# Patient Record
Sex: Female | Born: 1940 | Race: White | Hispanic: No | Smoking: Former smoker
Health system: Southern US, Community
[De-identification: ages and names within clinical notes are randomized; demographics above are authoritative.]

## PROBLEM LIST (undated history)

## (undated) DIAGNOSIS — M5136 Other intervertebral disc degeneration, lumbar region: Secondary | ICD-10-CM

## (undated) DIAGNOSIS — K219 Gastro-esophageal reflux disease without esophagitis: Secondary | ICD-10-CM

## (undated) DIAGNOSIS — N189 Chronic kidney disease, unspecified: Secondary | ICD-10-CM

## (undated) DIAGNOSIS — R7303 Prediabetes: Secondary | ICD-10-CM

## (undated) DIAGNOSIS — K589 Irritable bowel syndrome without diarrhea: Secondary | ICD-10-CM

## (undated) DIAGNOSIS — I10 Essential (primary) hypertension: Secondary | ICD-10-CM

## (undated) DIAGNOSIS — M51369 Other intervertebral disc degeneration, lumbar region without mention of lumbar back pain or lower extremity pain: Secondary | ICD-10-CM

## (undated) DIAGNOSIS — M1990 Unspecified osteoarthritis, unspecified site: Secondary | ICD-10-CM

## (undated) DIAGNOSIS — E785 Hyperlipidemia, unspecified: Secondary | ICD-10-CM

## (undated) DIAGNOSIS — H269 Unspecified cataract: Secondary | ICD-10-CM

## (undated) HISTORY — PX: COLONOSCOPY: SHX174

## (undated) HISTORY — DX: Irritable bowel syndrome, unspecified: K58.9

## (undated) HISTORY — DX: Essential (primary) hypertension: I10

## (undated) HISTORY — DX: Prediabetes: R73.03

## (undated) HISTORY — PX: ABDOMINAL HYSTERECTOMY: SHX81

## (undated) HISTORY — DX: Unspecified cataract: H26.9

## (undated) HISTORY — DX: Gastro-esophageal reflux disease without esophagitis: K21.9

## (undated) HISTORY — DX: Other intervertebral disc degeneration, lumbar region: M51.36

## (undated) HISTORY — DX: Other intervertebral disc degeneration, lumbar region without mention of lumbar back pain or lower extremity pain: M51.369

## (undated) HISTORY — DX: Unspecified osteoarthritis, unspecified site: M19.90

## (undated) HISTORY — DX: Hyperlipidemia, unspecified: E78.5

## (undated) HISTORY — DX: Chronic kidney disease, unspecified: N18.9

## (undated) HISTORY — PX: CATARACT EXTRACTION: SUR2

---

## 1998-10-13 ENCOUNTER — Encounter: Payer: Self-pay | Admitting: Internal Medicine

## 1998-10-13 ENCOUNTER — Ambulatory Visit (HOSPITAL_COMMUNITY): Admission: RE | Admit: 1998-10-13 | Discharge: 1998-10-13 | Payer: Self-pay | Admitting: Internal Medicine

## 1999-02-11 ENCOUNTER — Emergency Department (HOSPITAL_COMMUNITY): Admission: EM | Admit: 1999-02-11 | Discharge: 1999-02-11 | Payer: Self-pay | Admitting: Emergency Medicine

## 1999-10-16 ENCOUNTER — Encounter: Payer: Self-pay | Admitting: Internal Medicine

## 1999-10-16 ENCOUNTER — Ambulatory Visit (HOSPITAL_COMMUNITY): Admission: RE | Admit: 1999-10-16 | Discharge: 1999-10-16 | Payer: Self-pay | Admitting: Internal Medicine

## 2000-10-25 ENCOUNTER — Encounter: Payer: Self-pay | Admitting: Internal Medicine

## 2000-10-25 ENCOUNTER — Ambulatory Visit (HOSPITAL_COMMUNITY): Admission: RE | Admit: 2000-10-25 | Discharge: 2000-10-25 | Payer: Self-pay | Admitting: Internal Medicine

## 2001-10-27 ENCOUNTER — Ambulatory Visit (HOSPITAL_COMMUNITY): Admission: RE | Admit: 2001-10-27 | Discharge: 2001-10-27 | Payer: Self-pay | Admitting: Internal Medicine

## 2001-10-27 ENCOUNTER — Encounter: Payer: Self-pay | Admitting: Internal Medicine

## 2002-11-10 ENCOUNTER — Encounter: Payer: Self-pay | Admitting: *Deleted

## 2002-11-10 ENCOUNTER — Emergency Department (HOSPITAL_COMMUNITY): Admission: AC | Admit: 2002-11-10 | Discharge: 2002-11-10 | Payer: Self-pay

## 2002-11-19 ENCOUNTER — Encounter: Payer: Self-pay | Admitting: Internal Medicine

## 2002-11-19 ENCOUNTER — Ambulatory Visit (HOSPITAL_COMMUNITY): Admission: RE | Admit: 2002-11-19 | Discharge: 2002-11-19 | Payer: Self-pay | Admitting: Internal Medicine

## 2003-12-10 ENCOUNTER — Ambulatory Visit (HOSPITAL_COMMUNITY): Admission: RE | Admit: 2003-12-10 | Discharge: 2003-12-10 | Payer: Self-pay | Admitting: Internal Medicine

## 2004-01-05 ENCOUNTER — Ambulatory Visit (HOSPITAL_COMMUNITY): Admission: RE | Admit: 2004-01-05 | Discharge: 2004-01-05 | Payer: Self-pay | Admitting: Internal Medicine

## 2004-12-14 ENCOUNTER — Ambulatory Visit (HOSPITAL_COMMUNITY): Admission: RE | Admit: 2004-12-14 | Discharge: 2004-12-14 | Payer: Self-pay | Admitting: Internal Medicine

## 2005-03-12 ENCOUNTER — Ambulatory Visit: Payer: Self-pay

## 2005-12-25 ENCOUNTER — Ambulatory Visit (HOSPITAL_COMMUNITY): Admission: RE | Admit: 2005-12-25 | Discharge: 2005-12-25 | Payer: Self-pay | Admitting: Internal Medicine

## 2006-12-30 ENCOUNTER — Ambulatory Visit (HOSPITAL_COMMUNITY): Admission: RE | Admit: 2006-12-30 | Discharge: 2006-12-30 | Payer: Self-pay | Admitting: Internal Medicine

## 2008-01-27 ENCOUNTER — Ambulatory Visit (HOSPITAL_COMMUNITY): Admission: RE | Admit: 2008-01-27 | Discharge: 2008-01-27 | Payer: Self-pay | Admitting: Internal Medicine

## 2008-12-04 ENCOUNTER — Emergency Department (HOSPITAL_COMMUNITY): Admission: EM | Admit: 2008-12-04 | Discharge: 2008-12-04 | Payer: Self-pay | Admitting: Emergency Medicine

## 2009-05-06 ENCOUNTER — Ambulatory Visit (HOSPITAL_COMMUNITY): Admission: RE | Admit: 2009-05-06 | Discharge: 2009-05-06 | Payer: Self-pay | Admitting: Internal Medicine

## 2009-11-09 ENCOUNTER — Ambulatory Visit: Payer: Self-pay | Admitting: Oncology

## 2009-11-16 LAB — CBC & DIFF AND RETIC
BASO%: 0.3 % (ref 0.0–2.0)
LYMPH%: 33.3 % (ref 14.0–49.7)
MCHC: 32.8 g/dL (ref 31.5–36.0)
MONO#: 0.9 10*3/uL (ref 0.1–0.9)
MONO%: 7.3 % (ref 0.0–14.0)
Platelets: 286 10*3/uL (ref 145–400)
RBC: 3.88 10*6/uL (ref 3.70–5.45)
Retic %: 1.35 % (ref 0.50–1.50)
WBC: 11.8 10*3/uL — ABNORMAL HIGH (ref 3.9–10.3)

## 2009-11-16 LAB — CHCC SMEAR

## 2009-11-18 LAB — SPEP & IFE WITH QIG
Alpha-2-Globulin: 13.5 % — ABNORMAL HIGH (ref 7.1–11.8)
Gamma Globulin: 12.4 % (ref 11.1–18.8)
IgA: 306 mg/dL (ref 68–378)
IgG (Immunoglobin G), Serum: 818 mg/dL (ref 694–1618)
IgM, Serum: 158 mg/dL (ref 60–263)

## 2009-11-18 LAB — COMPREHENSIVE METABOLIC PANEL
ALT: 9 U/L (ref 0–35)
AST: 11 U/L (ref 0–37)
Albumin: 3.6 g/dL (ref 3.5–5.2)
Alkaline Phosphatase: 104 U/L (ref 39–117)
BUN: 26 mg/dL — ABNORMAL HIGH (ref 6–23)
CO2: 24 mEq/L (ref 19–32)
Calcium: 9.3 mg/dL (ref 8.4–10.5)
Chloride: 104 mEq/L (ref 96–112)
Creatinine, Ser: 1.74 mg/dL — ABNORMAL HIGH (ref 0.40–1.20)
Glucose, Bld: 73 mg/dL (ref 70–99)
Potassium: 4.8 mEq/L (ref 3.5–5.3)
Sodium: 136 mEq/L (ref 135–145)
Total Bilirubin: 0.4 mg/dL (ref 0.3–1.2)
Total Protein: 6.3 g/dL (ref 6.0–8.3)

## 2009-11-18 LAB — IRON AND TIBC
%SAT: 15 % — ABNORMAL LOW (ref 20–55)
Iron: 52 ug/dL (ref 42–145)
TIBC: 356 ug/dL (ref 250–470)
UIBC: 304 ug/dL

## 2009-11-18 LAB — ERYTHROPOIETIN: Erythropoietin: 11.5 m[IU]/mL (ref 2.6–34.0)

## 2009-11-18 LAB — FERRITIN: Ferritin: 17 ng/mL (ref 10–291)

## 2009-11-18 LAB — FOLATE: Folate: 10.5 ng/mL

## 2010-01-06 ENCOUNTER — Ambulatory Visit: Payer: Self-pay | Admitting: Oncology

## 2010-01-10 LAB — CBC WITH DIFFERENTIAL/PLATELET
BASO%: 0.4 % (ref 0.0–2.0)
Basophils Absolute: 0 10*3/uL (ref 0.0–0.1)
EOS%: 1.5 % (ref 0.0–7.0)
Eosinophils Absolute: 0.2 10*3/uL (ref 0.0–0.5)
HCT: 33.2 % — ABNORMAL LOW (ref 34.8–46.6)
HGB: 11.2 g/dL — ABNORMAL LOW (ref 11.6–15.9)
LYMPH%: 31 % (ref 14.0–49.7)
MCH: 28.9 pg (ref 25.1–34.0)
MCHC: 33.7 g/dL (ref 31.5–36.0)
MCV: 85.7 fL (ref 79.5–101.0)
MONO#: 0.7 10*3/uL (ref 0.1–0.9)
MONO%: 6.8 % (ref 0.0–14.0)
NEUT#: 6.3 10*3/uL (ref 1.5–6.5)
NEUT%: 60.3 % (ref 38.4–76.8)
Platelets: 259 10*3/uL (ref 145–400)
RBC: 3.87 10*6/uL (ref 3.70–5.45)
RDW: 13.4 % (ref 11.2–14.5)
WBC: 10.5 10*3/uL — ABNORMAL HIGH (ref 3.9–10.3)
lymph#: 3.2 10*3/uL (ref 0.9–3.3)

## 2010-01-10 LAB — CHCC SMEAR

## 2010-05-15 ENCOUNTER — Ambulatory Visit: Payer: Self-pay | Admitting: Oncology

## 2010-05-17 LAB — CBC WITH DIFFERENTIAL/PLATELET
BASO%: 0.3 % (ref 0.0–2.0)
Basophils Absolute: 0 10*3/uL (ref 0.0–0.1)
EOS%: 2.1 % (ref 0.0–7.0)
Eosinophils Absolute: 0.2 10*3/uL (ref 0.0–0.5)
HCT: 32.5 % — ABNORMAL LOW (ref 34.8–46.6)
HGB: 11.2 g/dL — ABNORMAL LOW (ref 11.6–15.9)
LYMPH%: 21.1 % (ref 14.0–49.7)
MCH: 29.2 pg (ref 25.1–34.0)
MCHC: 34.4 g/dL (ref 31.5–36.0)
MCV: 84.7 fL (ref 79.5–101.0)
MONO#: 0.8 10*3/uL (ref 0.1–0.9)
MONO%: 8.6 % (ref 0.0–14.0)
NEUT#: 6 10*3/uL (ref 1.5–6.5)
NEUT%: 67.9 % (ref 38.4–76.8)
Platelets: 249 10*3/uL (ref 145–400)
RBC: 3.84 10*6/uL (ref 3.70–5.45)
RDW: 13.6 % (ref 11.2–14.5)
WBC: 8.8 10*3/uL (ref 3.9–10.3)
lymph#: 1.8 10*3/uL (ref 0.9–3.3)

## 2010-05-17 LAB — COMPREHENSIVE METABOLIC PANEL
ALT: 12 U/L (ref 0–35)
AST: 15 U/L (ref 0–37)
Albumin: 3.6 g/dL (ref 3.5–5.2)
Alkaline Phosphatase: 106 U/L (ref 39–117)
BUN: 27 mg/dL — ABNORMAL HIGH (ref 6–23)
CO2: 22 mEq/L (ref 19–32)
Calcium: 8.7 mg/dL (ref 8.4–10.5)
Chloride: 105 mEq/L (ref 96–112)
Creatinine, Ser: 1.82 mg/dL — ABNORMAL HIGH (ref 0.40–1.20)
Glucose, Bld: 101 mg/dL — ABNORMAL HIGH (ref 70–99)
Potassium: 5.3 mEq/L (ref 3.5–5.3)
Sodium: 137 mEq/L (ref 135–145)
Total Bilirubin: 0.4 mg/dL (ref 0.3–1.2)
Total Protein: 6.1 g/dL (ref 6.0–8.3)

## 2010-05-17 LAB — IRON AND TIBC
%SAT: 15 % — ABNORMAL LOW (ref 20–55)
Iron: 54 ug/dL (ref 42–145)
TIBC: 366 ug/dL (ref 250–470)
UIBC: 312 ug/dL

## 2010-05-17 LAB — FERRITIN: Ferritin: 11 ng/mL (ref 10–291)

## 2010-06-21 ENCOUNTER — Ambulatory Visit (HOSPITAL_COMMUNITY): Admission: RE | Admit: 2010-06-21 | Discharge: 2010-06-21 | Payer: Self-pay | Admitting: Internal Medicine

## 2010-12-20 ENCOUNTER — Ambulatory Visit: Payer: Self-pay | Admitting: Oncology

## 2010-12-23 ENCOUNTER — Encounter: Payer: Self-pay | Admitting: Internal Medicine

## 2011-01-23 ENCOUNTER — Other Ambulatory Visit: Payer: Self-pay | Admitting: Oncology

## 2011-01-23 ENCOUNTER — Encounter (HOSPITAL_BASED_OUTPATIENT_CLINIC_OR_DEPARTMENT_OTHER): Payer: Medicare Other | Admitting: Oncology

## 2011-01-23 DIAGNOSIS — N189 Chronic kidney disease, unspecified: Secondary | ICD-10-CM

## 2011-01-23 DIAGNOSIS — D638 Anemia in other chronic diseases classified elsewhere: Secondary | ICD-10-CM

## 2011-01-23 DIAGNOSIS — D649 Anemia, unspecified: Secondary | ICD-10-CM

## 2011-01-23 LAB — CBC WITH DIFFERENTIAL/PLATELET
BASO%: 0.4 % (ref 0.0–2.0)
Basophils Absolute: 0 10*3/uL (ref 0.0–0.1)
EOS%: 1.1 % (ref 0.0–7.0)
Eosinophils Absolute: 0.1 10*3/uL (ref 0.0–0.5)
HCT: 33.2 % — ABNORMAL LOW (ref 34.8–46.6)
HGB: 11.3 g/dL — ABNORMAL LOW (ref 11.6–15.9)
LYMPH%: 20 % (ref 14.0–49.7)
MCH: 28.3 pg (ref 25.1–34.0)
MCHC: 34.1 g/dL (ref 31.5–36.0)
MCV: 82.9 fL (ref 79.5–101.0)
MONO#: 0.5 10*3/uL (ref 0.1–0.9)
MONO%: 6.4 % (ref 0.0–14.0)
NEUT#: 6.2 10*3/uL (ref 1.5–6.5)
NEUT%: 72.1 % (ref 38.4–76.8)
Platelets: 221 10*3/uL (ref 145–400)
RBC: 4 10*6/uL (ref 3.70–5.45)
RDW: 13.7 % (ref 11.2–14.5)
WBC: 8.6 10*3/uL (ref 3.9–10.3)
lymph#: 1.7 10*3/uL (ref 0.9–3.3)

## 2011-01-23 LAB — COMPREHENSIVE METABOLIC PANEL
ALT: 13 U/L (ref 0–35)
AST: 19 U/L (ref 0–37)
Albumin: 3.2 g/dL — ABNORMAL LOW (ref 3.5–5.2)
Alkaline Phosphatase: 95 U/L (ref 39–117)
BUN: 28 mg/dL — ABNORMAL HIGH (ref 6–23)
CO2: 26 mEq/L (ref 19–32)
Calcium: 9 mg/dL (ref 8.4–10.5)
Chloride: 103 mEq/L (ref 96–112)
Creatinine, Ser: 1.77 mg/dL — ABNORMAL HIGH (ref 0.40–1.20)
Glucose, Bld: 86 mg/dL (ref 70–99)
Potassium: 4.7 mEq/L (ref 3.5–5.3)
Sodium: 137 mEq/L (ref 135–145)
Total Bilirubin: 0.6 mg/dL (ref 0.3–1.2)
Total Protein: 6.2 g/dL (ref 6.0–8.3)

## 2011-01-23 LAB — FERRITIN: Ferritin: 11 ng/mL (ref 10–291)

## 2011-01-23 LAB — IRON AND TIBC
%SAT: 18 % — ABNORMAL LOW (ref 20–55)
TIBC: 403 ug/dL (ref 250–470)

## 2011-08-06 ENCOUNTER — Emergency Department (HOSPITAL_COMMUNITY)
Admission: EM | Admit: 2011-08-06 | Discharge: 2011-08-06 | Disposition: A | Discharge status: Home / Self Care | Payer: Medicare Other | Attending: Emergency Medicine | Admitting: Emergency Medicine

## 2011-08-06 DIAGNOSIS — K219 Gastro-esophageal reflux disease without esophagitis: Secondary | ICD-10-CM | POA: Insufficient documentation

## 2011-08-06 DIAGNOSIS — E860 Dehydration: Secondary | ICD-10-CM | POA: Insufficient documentation

## 2011-08-06 DIAGNOSIS — R197 Diarrhea, unspecified: Secondary | ICD-10-CM | POA: Insufficient documentation

## 2011-08-06 DIAGNOSIS — R112 Nausea with vomiting, unspecified: Secondary | ICD-10-CM | POA: Insufficient documentation

## 2011-08-06 DIAGNOSIS — Z79899 Other long term (current) drug therapy: Secondary | ICD-10-CM | POA: Insufficient documentation

## 2011-08-06 DIAGNOSIS — Z7982 Long term (current) use of aspirin: Secondary | ICD-10-CM | POA: Insufficient documentation

## 2011-08-06 DIAGNOSIS — I10 Essential (primary) hypertension: Secondary | ICD-10-CM | POA: Insufficient documentation

## 2011-08-06 LAB — URINALYSIS, ROUTINE W REFLEX MICROSCOPIC
Bilirubin Urine: NEGATIVE
Hgb urine dipstick: NEGATIVE
Ketones, ur: NEGATIVE mg/dL
Nitrite: NEGATIVE
Protein, ur: 100 mg/dL — AB
Specific Gravity, Urine: 1.02 (ref 1.005–1.030)
Urobilinogen, UA: 0.2 mg/dL (ref 0.0–1.0)

## 2011-08-06 LAB — URINE MICROSCOPIC-ADD ON

## 2011-08-06 LAB — POCT I-STAT TROPONIN I: Troponin i, poc: 0.01 ng/mL (ref 0.00–0.08)

## 2011-08-09 ENCOUNTER — Other Ambulatory Visit: Payer: Self-pay | Admitting: Internal Medicine

## 2011-08-09 ENCOUNTER — Inpatient Hospital Stay (HOSPITAL_COMMUNITY): Payer: Medicare Other

## 2011-08-09 ENCOUNTER — Inpatient Hospital Stay (HOSPITAL_COMMUNITY)
Admission: EM | Admit: 2011-08-09 | Discharge: 2011-08-17 | DRG: 682 | Disposition: A | Discharge status: Home Health | Payer: Medicare Other | Attending: Internal Medicine | Admitting: Internal Medicine

## 2011-08-09 ENCOUNTER — Emergency Department (HOSPITAL_COMMUNITY): Payer: Medicare Other

## 2011-08-09 DIAGNOSIS — Z7982 Long term (current) use of aspirin: Secondary | ICD-10-CM

## 2011-08-09 DIAGNOSIS — D649 Anemia, unspecified: Secondary | ICD-10-CM | POA: Diagnosis present

## 2011-08-09 DIAGNOSIS — E785 Hyperlipidemia, unspecified: Secondary | ICD-10-CM | POA: Diagnosis present

## 2011-08-09 DIAGNOSIS — T465X5A Adverse effect of other antihypertensive drugs, initial encounter: Secondary | ICD-10-CM | POA: Diagnosis present

## 2011-08-09 DIAGNOSIS — J810 Acute pulmonary edema: Secondary | ICD-10-CM | POA: Diagnosis not present

## 2011-08-09 DIAGNOSIS — Z79899 Other long term (current) drug therapy: Secondary | ICD-10-CM

## 2011-08-09 DIAGNOSIS — N183 Chronic kidney disease, stage 3 unspecified: Secondary | ICD-10-CM | POA: Diagnosis present

## 2011-08-09 DIAGNOSIS — J9600 Acute respiratory failure, unspecified whether with hypoxia or hypercapnia: Secondary | ICD-10-CM | POA: Diagnosis not present

## 2011-08-09 DIAGNOSIS — E875 Hyperkalemia: Secondary | ICD-10-CM | POA: Diagnosis present

## 2011-08-09 DIAGNOSIS — E871 Hypo-osmolality and hyponatremia: Secondary | ICD-10-CM | POA: Diagnosis present

## 2011-08-09 DIAGNOSIS — E872 Acidosis, unspecified: Secondary | ICD-10-CM | POA: Diagnosis present

## 2011-08-09 DIAGNOSIS — N170 Acute kidney failure with tubular necrosis: Principal | ICD-10-CM | POA: Diagnosis present

## 2011-08-09 DIAGNOSIS — I509 Heart failure, unspecified: Secondary | ICD-10-CM | POA: Diagnosis not present

## 2011-08-09 DIAGNOSIS — D72829 Elevated white blood cell count, unspecified: Secondary | ICD-10-CM | POA: Diagnosis present

## 2011-08-09 DIAGNOSIS — N390 Urinary tract infection, site not specified: Secondary | ICD-10-CM | POA: Diagnosis present

## 2011-08-09 DIAGNOSIS — K449 Diaphragmatic hernia without obstruction or gangrene: Secondary | ICD-10-CM | POA: Diagnosis present

## 2011-08-09 DIAGNOSIS — K219 Gastro-esophageal reflux disease without esophagitis: Secondary | ICD-10-CM | POA: Diagnosis present

## 2011-08-09 DIAGNOSIS — K5289 Other specified noninfective gastroenteritis and colitis: Secondary | ICD-10-CM | POA: Diagnosis present

## 2011-08-09 DIAGNOSIS — R197 Diarrhea, unspecified: Secondary | ICD-10-CM | POA: Diagnosis present

## 2011-08-09 DIAGNOSIS — E8779 Other fluid overload: Secondary | ICD-10-CM | POA: Diagnosis not present

## 2011-08-09 DIAGNOSIS — I129 Hypertensive chronic kidney disease with stage 1 through stage 4 chronic kidney disease, or unspecified chronic kidney disease: Secondary | ICD-10-CM | POA: Diagnosis present

## 2011-08-09 DIAGNOSIS — B957 Other staphylococcus as the cause of diseases classified elsewhere: Secondary | ICD-10-CM | POA: Diagnosis present

## 2011-08-09 LAB — URINALYSIS, ROUTINE W REFLEX MICROSCOPIC
Glucose, UA: NEGATIVE mg/dL
Hgb urine dipstick: NEGATIVE
Ketones, ur: NEGATIVE mg/dL
pH: 5 (ref 5.0–8.0)

## 2011-08-09 LAB — DIFFERENTIAL
Eosinophils Absolute: 0 10*3/uL (ref 0.0–0.7)
Lymphocytes Relative: 6 % — ABNORMAL LOW (ref 12–46)
Lymphs Abs: 1.2 10*3/uL (ref 0.7–4.0)
Monocytes Relative: 3 % (ref 3–12)
Neutro Abs: 17.1 10*3/uL — ABNORMAL HIGH (ref 1.7–7.7)
Neutrophils Relative %: 91 % — ABNORMAL HIGH (ref 43–77)

## 2011-08-09 LAB — URINE MICROSCOPIC-ADD ON

## 2011-08-09 LAB — BASIC METABOLIC PANEL
CO2: 12 mEq/L — ABNORMAL LOW (ref 19–32)
Chloride: 94 mEq/L — ABNORMAL LOW (ref 96–112)
Creatinine, Ser: 15.11 mg/dL — ABNORMAL HIGH (ref 0.50–1.10)
Glucose, Bld: 165 mg/dL — ABNORMAL HIGH (ref 70–99)

## 2011-08-09 LAB — CBC
HCT: 27 % — ABNORMAL LOW (ref 36.0–46.0)
Hemoglobin: 9.9 g/dL — ABNORMAL LOW (ref 12.0–15.0)
MCH: 28.4 pg (ref 26.0–34.0)
MCV: 77.6 fL — ABNORMAL LOW (ref 78.0–100.0)
Platelets: 310 10*3/uL (ref 150–400)
RBC: 3.48 MIL/uL — ABNORMAL LOW (ref 3.87–5.11)
WBC: 18.8 10*3/uL — ABNORMAL HIGH (ref 4.0–10.5)

## 2011-08-09 LAB — POCT I-STAT TROPONIN I: Troponin i, poc: 0 ng/mL (ref 0.00–0.08)

## 2011-08-10 ENCOUNTER — Inpatient Hospital Stay (HOSPITAL_COMMUNITY): Payer: Medicare Other

## 2011-08-10 LAB — BLOOD GAS, ARTERIAL
Acid-base deficit: 18.1 mmol/L — ABNORMAL HIGH (ref 0.0–2.0)
Bicarbonate: 8.5 mEq/L — ABNORMAL LOW (ref 20.0–24.0)
Delivery systems: POSITIVE
Expiratory PAP: 8
FIO2: 0.4 %
Inspiratory PAP: 12
Mode: POSITIVE
O2 Saturation: 98.6 %
Patient temperature: 98.6
TCO2: 9.2 mmol/L (ref 0–100)
pCO2 arterial: 22.9 mmHg — ABNORMAL LOW (ref 35.0–45.0)
pH, Arterial: 7.195 — CL (ref 7.350–7.400)
pO2, Arterial: 158 mmHg — ABNORMAL HIGH (ref 80.0–100.0)

## 2011-08-10 LAB — CBC
HCT: 23.3 % — ABNORMAL LOW (ref 36.0–46.0)
Hemoglobin: 8.3 g/dL — ABNORMAL LOW (ref 12.0–15.0)
MCH: 27.9 pg (ref 26.0–34.0)
MCHC: 35.6 g/dL (ref 30.0–36.0)
MCV: 78.2 fL (ref 78.0–100.0)
Platelets: 316 10*3/uL (ref 150–400)
RBC: 2.98 MIL/uL — ABNORMAL LOW (ref 3.87–5.11)
RDW: 12.9 % (ref 11.5–15.5)
WBC: 17.8 10*3/uL — ABNORMAL HIGH (ref 4.0–10.5)

## 2011-08-10 LAB — ANA: Anti Nuclear Antibody(ANA): NEGATIVE

## 2011-08-10 LAB — BASIC METABOLIC PANEL
BUN: 82 mg/dL — ABNORMAL HIGH (ref 6–23)
BUN: 81 mg/dL — ABNORMAL HIGH (ref 6–23)
CO2: 10 mEq/L — CL (ref 19–32)
Calcium: 6.7 mg/dL — ABNORMAL LOW (ref 8.4–10.5)
Chloride: 99 mEq/L (ref 96–112)
Chloride: 99 mEq/L (ref 96–112)
Creatinine, Ser: 15.73 mg/dL — ABNORMAL HIGH (ref 0.50–1.10)
GFR calc Af Amer: 3 mL/min — ABNORMAL LOW (ref >60)
GFR calc Af Amer: 3 mL/min — ABNORMAL LOW (ref >60)
GFR calc non Af Amer: 2 mL/min — ABNORMAL LOW (ref >60)
Glucose, Bld: 75 mg/dL (ref 70–99)
Glucose, Bld: 97 mg/dL (ref 70–99)
Potassium: 4.6 mEq/L (ref 3.5–5.1)
Potassium: 5.4 mEq/L — ABNORMAL HIGH (ref 3.5–5.1)
Sodium: 127 mEq/L — ABNORMAL LOW (ref 135–145)

## 2011-08-10 LAB — MRSA PCR SCREENING: MRSA by PCR: NEGATIVE

## 2011-08-10 LAB — CARDIAC PANEL(CRET KIN+CKTOT+MB+TROPI)
Relative Index: INVALID (ref 0.0–2.5)
Troponin I: <0.3 ng/mL (ref <0.30)

## 2011-08-10 LAB — C3 COMPLEMENT: C3 Complement: 115 mg/dL (ref 90–180)

## 2011-08-10 LAB — CLOSTRIDIUM DIFFICILE BY PCR: Toxigenic C. Difficile by PCR: NEGATIVE

## 2011-08-10 LAB — C4 COMPLEMENT: Complement C4, Body Fluid: 19 mg/dL (ref 10–40)

## 2011-08-11 ENCOUNTER — Inpatient Hospital Stay (HOSPITAL_COMMUNITY): Payer: Medicare Other

## 2011-08-11 DIAGNOSIS — N170 Acute kidney failure with tubular necrosis: Secondary | ICD-10-CM

## 2011-08-11 LAB — HEPATIC FUNCTION PANEL
Albumin: 2.6 g/dL — ABNORMAL LOW (ref 3.5–5.2)
Total Bilirubin: 0.3 mg/dL (ref 0.3–1.2)

## 2011-08-11 LAB — CBC
HCT: 28.5 % — ABNORMAL LOW (ref 36.0–46.0)
MCHC: 36.5 g/dL — ABNORMAL HIGH (ref 30.0–36.0)
RDW: 13 % (ref 11.5–15.5)

## 2011-08-11 LAB — BLOOD GAS, ARTERIAL
Acid-base deficit: 13.9 mmol/L — ABNORMAL HIGH (ref 0.0–2.0)
Bicarbonate: 11.6 meq/L — ABNORMAL LOW (ref 20.0–24.0)
Delivery systems: POSITIVE
Expiratory PAP: 8
FIO2: 40 %
Inspiratory PAP: 12
Mode: POSITIVE
O2 Saturation: 99.3 %
Patient temperature: 98.6
TCO2: 12.4 mmol/L (ref 0–100)
pCO2 arterial: 25.8 mmHg — ABNORMAL LOW (ref 35.0–45.0)
pH, Arterial: 7.274 — ABNORMAL LOW (ref 7.350–7.400)
pO2, Arterial: 203 mmHg — ABNORMAL HIGH (ref 80.0–100.0)

## 2011-08-11 LAB — RENAL FUNCTION PANEL
Albumin: 2.6 g/dL — ABNORMAL LOW (ref 3.5–5.2)
CO2: 14 mEq/L — ABNORMAL LOW (ref 19–32)
Calcium: 7.4 mg/dL — ABNORMAL LOW (ref 8.4–10.5)
GFR calc Af Amer: 3 mL/min — ABNORMAL LOW (ref >60)
GFR calc non Af Amer: 2 mL/min — ABNORMAL LOW (ref >60)
Phosphorus: 10.5 mg/dL — ABNORMAL HIGH (ref 2.3–4.6)
Sodium: 131 mEq/L — ABNORMAL LOW (ref 135–145)

## 2011-08-11 LAB — BASIC METABOLIC PANEL
CO2: 22 mEq/L (ref 19–32)
Chloride: 98 mEq/L (ref 96–112)
Glucose, Bld: 87 mg/dL (ref 70–99)
Potassium: 4.2 mEq/L (ref 3.5–5.1)
Sodium: 136 mEq/L (ref 135–145)

## 2011-08-11 LAB — HEPATITIS B SURFACE ANTIGEN: Hepatitis B Surface Ag: NEGATIVE

## 2011-08-11 LAB — HEPATITIS B SURFACE ANTIBODY,QUALITATIVE: Hep B S Ab: NEGATIVE

## 2011-08-11 LAB — MAGNESIUM: Magnesium: 1.7 mg/dL (ref 1.5–2.5)

## 2011-08-12 LAB — CBC
HCT: 24 % — ABNORMAL LOW (ref 36.0–46.0)
Hemoglobin: 8.7 g/dL — ABNORMAL LOW (ref 12.0–15.0)
WBC: 10.1 10*3/uL (ref 4.0–10.5)

## 2011-08-12 LAB — RENAL FUNCTION PANEL
Albumin: 2.3 g/dL — ABNORMAL LOW (ref 3.5–5.2)
BUN: 57 mg/dL — ABNORMAL HIGH (ref 6–23)
Chloride: 100 mEq/L (ref 96–112)
GFR calc Af Amer: 4 mL/min — ABNORMAL LOW (ref >60)
Glucose, Bld: 105 mg/dL — ABNORMAL HIGH (ref 70–99)
Potassium: 4.1 mEq/L (ref 3.5–5.1)
Sodium: 138 mEq/L (ref 135–145)

## 2011-08-12 LAB — URINALYSIS, ROUTINE W REFLEX MICROSCOPIC
Bilirubin Urine: NEGATIVE
Glucose, UA: NEGATIVE mg/dL
Ketones, ur: NEGATIVE mg/dL
Protein, ur: NEGATIVE mg/dL

## 2011-08-12 LAB — EHEC TOXIN BY EIA, STOOL

## 2011-08-12 LAB — URINE MICROSCOPIC-ADD ON

## 2011-08-12 LAB — IRON AND TIBC
Iron: 32 ug/dL — ABNORMAL LOW (ref 42–135)
UIBC: 241 ug/dL (ref 125–400)

## 2011-08-12 LAB — URINE CULTURE

## 2011-08-12 LAB — PROTEIN, URINE, RANDOM: Total Protein, Urine: 6.6 mg/dL

## 2011-08-12 LAB — CREATININE, URINE, RANDOM: Creatinine, Urine: 90.91 mg/dL

## 2011-08-12 LAB — FERRITIN: Ferritin: 111 ng/mL (ref 10–291)

## 2011-08-12 NOTE — H&P (Signed)
NAMEPHYILLIS, KIRST                 ACCOUNT NO.:  [id number]  MEDICAL RECORD NO.:  [id number]  LOCATION:  [facility]                         FACILITY:  [facility]  PHYSICIAN:  Edythe Lynn, M.D.       DATE OF BIRTH:  11-Jul-1941  DATE OF ADMISSION:  08/09/2011 DATE OF DISCHARGE:                             HISTORY & PHYSICAL   PRIMARY CARE PHYSICIAN:  Unk Pinto, MD  CHIEF COMPLAINT:  Abnormal labs.  HISTORY OF PRESENT ILLNESS:  Kelly Cantu is a 70 year old woman with history of hypertension on ACE inhibitors, who was sent today to the emergency room by her primary care physician after she was found to have markedly abnormal creatinine.  The patient only feels little bit weak. Kelly Cantu reports that eight days prior to admission started having copious diarrhea.  She got very dehydrated and noted her blood pressure was low.  She stopped taking enalapril five days prior to admission. Three days prior to admission, she came to the emergency room and had some IV fluids and then was sent home.  At her primary care physician yesterday was found to have markedly elevated creatinine and told to come to the emergency room today.  The patient denies any nausea or vomiting.  She reports her diarrhea is already better.  She denies any abdominal pain.  She denies any rashes.  She denies any nose bleeding. She denies any chronic cough.  She denies any hemoptysis.  She has not taken any nonsteroidal inflammatory drugs on a daily basis.  She denies any pain.  PAST MEDICAL HISTORY:  Hypertension, gastroesophageal reflux disease, and hysterectomy.  HOME MEDICATIONS:  Aspirin, atenolol, Detrol, enalapril, estradiol, Lasix, Lipitor, and Nexium.  ALLERGIES:  FELDENE.  SOCIAL HISTORY:  Lives alone.  Does not drink.  Does not smoke.  Does not use any illicit drugs.  FAMILY HISTORY:  Positive for hypertension.  REVIEW OF SYSTEMS:  As per HPI.  All other systems reviewed are negative.  PHYSICAL EXAM:   VITAL SIGNS:  Upon admission, temperature is 97.5, blood pressure is 134/68, pulse 71, respiration 18, saturation 100% on room air. GENERAL APPEARANCE:  Woman in no acute distress, alert and oriented to place, person, and time. HEENT:  Head, normocephalic, atraumatic.  Eyes, pupils equal, round, and reactive to light and accommodation.  Extraocular movements intact. Throat, clear. NECK:  Supple.  No JVD. CHEST:  Clear to auscultation.  No wheezes, rhonchi, or crackles. HEART:  Regular rate and rhythm without murmurs, rubs, or gallops. ABDOMEN:  Soft, nontender, nondistended.  Bowel sounds are present. LOWER EXTREMITIES:  Without edema. SKIN:  Warm and dry.  No suspicious looking rashes. NEUROLOGIC:  Cranial nerves II through XII intact.  Strength 5/5 in 4 extremities.  Sensation intact.  LABORATORY DATA:  Laboratory values on admission, white blood cell count is 18,000, hemoglobin 9.9, absolute neutrophil count is 17.1, platelets of 310.  Sodium 125, potassium 5.4, bicarbonate is 12, chloride 94, BUN 81, creatinine 15, glucose 165, anion gap is 19.  Urinalysis shows no significant protein, no white blood cells.  Chest x-ray without any infiltrates.  EKG, normal sinus rhythm without any peaked T-waves.  No ST-T changes.  ASSESSMENT/PLAN:  A 70 year old woman, admitted with acute renal failure, most likely mechanism is due to marked dehydration from diarrhea followed by probably acute tubular necrosis in the setting of concomitant diuretic and ACE inhibitor usage.  Other possibilities are connective tissue disease or vasculitis, although even less likely I feel like it is possibility of obstructive uropathy with bilateral hydronephrosis.  In any case due to the patient's marked abnormal creatinine, presence of some peripheral edema, there is significant degree of leukocytosis, and presence of anemia, I am going to go ahead and do a complete workup for acute renal failure including  renal ultrasound, ANA, ANCA, C3, C4, urine for eosinophils.  I am also going to work up the patient's diarrhea with EHEC toxin, C. diff toxin, ova and parasites, and routine culture.  For now, I am going to attempt to hydrate the patient on IV fluids, monitor in's and out's closely, and see which way the creatinine is going to go in the next day.  For the patient's hyponatremia and hyperkalemia, I am going to give IV fluids and monitor.  I think her hyponatremia is hypovolemic in nature. Further plans of care will depend how she progresses.     Edythe Lynn, M.D.     SL/MEDQ  D:  08/09/2011  T:  08/09/2011  Job:  [id number]  cc:   Unk Pinto, M.D.  Electronically Signed by Edythe Lynn M.D. on 08/12/2011 03:27:35 PM

## 2011-08-13 LAB — RENAL FUNCTION PANEL
Albumin: 2.8 g/dL — ABNORMAL LOW (ref 3.5–5.2)
BUN: 64 mg/dL — ABNORMAL HIGH (ref 6–23)
CO2: 28 mEq/L (ref 19–32)
Chloride: 95 mEq/L — ABNORMAL LOW (ref 96–112)
Creatinine, Ser: 9.6 mg/dL — ABNORMAL HIGH (ref 0.50–1.10)
Glucose, Bld: 99 mg/dL (ref 70–99)
Potassium: 3.7 mEq/L (ref 3.5–5.1)

## 2011-08-13 LAB — CBC
HCT: 22.8 % — ABNORMAL LOW (ref 36.0–46.0)
Hemoglobin: 8.1 g/dL — ABNORMAL LOW (ref 12.0–15.0)
MCV: 79.4 fL (ref 78.0–100.0)
RBC: 2.87 MIL/uL — ABNORMAL LOW (ref 3.87–5.11)
RDW: 13.1 % (ref 11.5–15.5)
WBC: 10.7 10*3/uL — ABNORMAL HIGH (ref 4.0–10.5)

## 2011-08-13 LAB — GIARDIA/CRYPTOSPORIDIUM SCREEN(EIA)

## 2011-08-13 LAB — MAGNESIUM: Magnesium: 1.8 mg/dL (ref 1.5–2.5)

## 2011-08-14 LAB — CBC
MCH: 28.2 pg (ref 26.0–34.0)
MCV: 81.1 fL (ref 78.0–100.0)
Platelets: 294 10*3/uL (ref 150–400)
RDW: 13 % (ref 11.5–15.5)
WBC: 12.7 10*3/uL — ABNORMAL HIGH (ref 4.0–10.5)

## 2011-08-14 LAB — ANTI-NEUTROPHIL ANTIBODY

## 2011-08-14 LAB — STOOL CULTURE

## 2011-08-14 LAB — URINE CULTURE
Colony Count: NO GROWTH
Culture  Setup Time: 201209091948
Culture: NO GROWTH

## 2011-08-14 LAB — COMPREHENSIVE METABOLIC PANEL
AST: 11 U/L (ref 0–37)
Albumin: 2.7 g/dL — ABNORMAL LOW (ref 3.5–5.2)
Calcium: 8.2 mg/dL — ABNORMAL LOW (ref 8.4–10.5)
Creatinine, Ser: 8.05 mg/dL — ABNORMAL HIGH (ref 0.50–1.10)
Sodium: 137 mEq/L (ref 135–145)

## 2011-08-15 LAB — CBC
HCT: 25.7 % — ABNORMAL LOW (ref 36.0–46.0)
Hemoglobin: 8.7 g/dL — ABNORMAL LOW (ref 12.0–15.0)
RBC: 3.1 MIL/uL — ABNORMAL LOW (ref 3.87–5.11)
RDW: 12.9 % (ref 11.5–15.5)
WBC: 13.8 10*3/uL — ABNORMAL HIGH (ref 4.0–10.5)

## 2011-08-15 LAB — RENAL FUNCTION PANEL
Albumin: 2.9 g/dL — ABNORMAL LOW (ref 3.5–5.2)
BUN: 62 mg/dL — ABNORMAL HIGH (ref 6–23)
CO2: 30 mEq/L (ref 19–32)
Chloride: 99 mEq/L (ref 96–112)
Glucose, Bld: 123 mg/dL — ABNORMAL HIGH (ref 70–99)
Potassium: 4.2 mEq/L (ref 3.5–5.1)

## 2011-08-16 LAB — RENAL FUNCTION PANEL
Albumin: 2.8 g/dL — ABNORMAL LOW (ref 3.5–5.2)
BUN: 59 mg/dL — ABNORMAL HIGH (ref 6–23)
Chloride: 100 mEq/L (ref 96–112)
GFR calc non Af Amer: 9 mL/min — ABNORMAL LOW (ref >60)
Potassium: 4.3 mEq/L (ref 3.5–5.1)

## 2011-08-16 LAB — CBC
MCV: 83.5 fL (ref 78.0–100.0)
Platelets: 290 10*3/uL (ref 150–400)
RBC: 3.09 MIL/uL — ABNORMAL LOW (ref 3.87–5.11)
WBC: 14.7 10*3/uL — ABNORMAL HIGH (ref 4.0–10.5)

## 2011-08-17 LAB — CBC
MCV: 85.2 fL (ref 78.0–100.0)
Platelets: 271 10*3/uL (ref 150–400)
RBC: 3.17 MIL/uL — ABNORMAL LOW (ref 3.87–5.11)
WBC: 12.3 10*3/uL — ABNORMAL HIGH (ref 4.0–10.5)

## 2011-08-17 LAB — BASIC METABOLIC PANEL
CO2: 30 mEq/L (ref 19–32)
Chloride: 101 mEq/L (ref 96–112)
Potassium: 4.4 mEq/L (ref 3.5–5.1)
Sodium: 140 mEq/L (ref 135–145)

## 2011-08-30 NOTE — Progress Notes (Addendum)
Kelly Cantu, Kelly Cantu                 ACCOUNT NO.:  [id number]  MEDICAL RECORD NO.:  [id number]  LOCATION:  [id number]                         FACILITY:  [facility]  PHYSICIAN:  Cherene Altes, M.D.DATE OF BIRTH:  26-Feb-1941                                PROGRESS NOTE   DATE OF DISCHARGE: To be determined.  PRIMARY CARE PHYSICIAN: Unk Pinto, M.D.  ACTIVE DIAGNOSES AT THE PRESENT TIME: 1. Severe acute renal failure.     a.     Chronic kidney disease stage III with baseline creatinine of      1.5 to 1.8.     b.     Extensive evaluation unrevealing.     c.     It is felt to be related to ACE inhibitor therapy in the      setting of diarrhea-induced hypotension/severe dehydration.     d.     Status post hemodialysis catheter placement with      hemodialysis.     e.     Subsequent discontinuation of hemodialysis with spontaneous      return of renal function.     f.     Creatinine on a downward trend and presently at 6.7 with no      further ongoing diuresis.     g.     Presently nonoliguric. 2. Recurrent diarrhea.     a.     Giardia and Cryptosporidium negative.     b.     Negative for Shiga toxins 1 and 2.     c.     Stool culture negative for Salmonella, Shigella,      Campylobacter, or Yersinia.     d.     Clostridium difficile PCR pending.     e.     May require GI consult diff C. diff PCR negative. 3. Severe refractory metabolic acidosis - secondary to acute renal     failure - resolved. 4. Anemia.     a.     Iron studies are consistent with iron deficiency.     b.     123456 and folic acid are now stable.     c.     Consider further evaluation as an inpatient if GI consulted      or as an outpatient if not. 5. Persistent leukocytosis.     a.     Likely related to diarrhea.     b.     No other clinical signs or symptoms of ongoing acute severe      infection. 6. Urine culture positive for greater than 100,000 coag-negative staph     - recheck reveals no growth status post  short-term treatment.  PROCEDURES DURING HOSPITAL STAY: 1. Renal ultrasound on August 09, 2011 - mild renal cortical     thinning, but no renal lesions.  No hydronephrosis. 2. Placement of a right IJ hemodialysis cath on August 11, 2011 -     subsequently discontinued.  ACTIVE CONSULTATION: Nephrology.  HOSPITAL COURSE: Kelly Cantu is a very pleasant 70 year old female with a longstanding history of hypertension and gastroesophageal reflux disease.  She was on Lasix and enalapril in the outpatient setting.  Prior to her admission to the hospital, she developed significant diarrhea.  This had been persistent for 8 days.  She continues to take blood pressure medications during the initial phase of her severe diarrheal illness.  She did however herself noted blood pressure was low and therefore she stopped taking her enalapril after 3-4 days of copious diarrhea.  She nonetheless began to experience significant severe weakness and some dizziness.  She presented to her primary care physician's office for evaluation.  Laboratory tests were assessed at that time and the patient was noted to be in acute renal failure.  As a result, she was referred to hospital for direct admission.  At time of her presentation, she had a BUN of 81 and creatinine of 15.  She was noted to have a bicarb of 12 with an anion gap of 19 and a sodium of 125.  She was admitted to acute units.  A serologic workup for acute renal failure was initiated.  A renal ultrasound was accomplished and revealed no acutely reversible hydronephrosis.  Nephrology was consulted.  An initial attempt was made to manage the patient with medical therapy alone.  Unfortunately, on the night of August 10, 2011, she developed acute severe pulmonary edema.  As a result, a hemodialysis catheter had to be placed semi-emergently and this was accomplished in the early morning hours of August 11, 2011. Hemodialysis was commenced.   The patient tolerated her hemodialysis quite well.  At this point, the patient is converted to a nonoliguric renal failure.  Creatinine is continuing to decline despite the fact that she is not receiving ongoing dialysis..  It is not felt that she will require any further hemodialysis treatments and as a result, Nephrology has discontinued her hemodialysis catheter.  The patient is to avoid ACE inhibitors an ARBs and we need to watch her very closely to assure that her volume status is preserved.  As noted above, the patient had no significant severe watery diarrhea that preceded her hospital stay.  It is believed that this was the primary etiology of her acute renal failure given the fact she was markedly dehydrated in the face of ongoing use of an ACE inhibitor.  Now that her renal function has improved, she is yet again begun to develop diarrhea.  Throughout the majority of her hospital stay, the patient was not actually having bowel movements.  Now that her bowels have began to move again over the last 24-36 hours, she reports that she is having copious amounts of very soft and at times watery stool.  Part of her initial evaluation included screening for typical causes of diarrhea. As noted above, the patient was negative for Shiga toxins, Giardia, Cryptosporidium, or typical bacterial etiologies of colitis.  At this time a C. diff assessment is being carried out with PCR.  If this proves to be negative, we may need to consider GI consultation for subacute diarrhea due to the importance of its connection to her acute renal failure.  Certainly, the patient cannot afford to go home with ongoing diarrhea given that her kidneys do not need to be challenged with another episode of hypotension at this time.  As noted above, the patient also had a urine culture that was suggestive of a urinary tract infection.  This was obtained on August 09, 2011. Of note, the patient's urinalysis at  that time was not in fact convincing of a urinary tract infection.  Nonetheless, culture returned to reveal greater than 100,000 coag-negative staph.  One could anticipate that this was likely representative of a contaminant. Nonetheless, the patient was treated with a short course of antibiotic therapy directed at this agent.  Repeat urine studies were accomplished on August 12, 2011 to include culture.  The patient's urinalysis was in fact more consistent with urinary tract infection at this time, but her urine culture was unrevealing.  Given her ongoing diarrhea and the concern for possible C. diff colitis, we have decided not to pursue any further antibiotic treatment at this time.  We will continue to follow her clinically.  If she develops fever, we will need to consider repeating yet another urinalysis to clarify if in fact the patient's urinary tract infection.  PHYSICAL EXAMINATION ON DATE OF TRANSFER: GENERAL:  The patient is resting comfortably in a hospital bed at the present time.  She denies fevers, chills, shortness of breath, nausea or vomiting.  She does report some ongoing diarrhea, but has no abdominal pain or cramps.  There is no bright red blood per rectum or melena. VITAL SIGNS:  Temperature is 97.9, blood pressure 168/50, heart rate 55, respiratory rate 17, O2 sat 98% on room air. LUNGS:  Clear to auscultation bilaterally without wheeze or rhonchi. CARDIOVASCULAR:  Regular rate and rhythm without murmur, gallop, or rub. Normal S1 and S2. ABDOMEN:  Nontender, nondistended, soft.  Bowel sounds are present.  No organomegaly, rebound or ascites. EXTREMITIES:  There is no significant cyanosis, clubbing, edema in bilateral extremities.     Cherene Altes, M.D.     JTM/MEDQ  D:  08/15/2011  T:  08/15/2011  Job:  [id number]  cc:   Unk Pinto, M.D. [phone]  Electronically Signed by Joette Catching M.D. on 08/30/2011 09:51:54 AM Electronically  Signed by Joette Catching M.D. on 08/30/2011 09:53:48 AM

## 2011-08-30 NOTE — Consult Note (Signed)
NAMEDALENE, WORM NO.:  [id number]  MEDICAL RECORD NO.:  [id number]  LOCATION:  [id number]                         FACILITY:  [facility]  PHYSICIAN:  Elmarie Shiley, MD          DATE OF BIRTH:  Apr 21, 1941  DATE OF CONSULTATION:  08/10/2011 DATE OF DISCHARGE:                                CONSULTATION   PRIMARY CARE PROVIDER:  Unk Pinto, MD.  Nephrology is consulted by Edythe Lynn, MD of [facility] for the evaluation and management of Ms. Dehnel' acute renal failure on chronic kidney disease stage III.  HISTORY OF PRESENT ILLNESS:  Ms. Roger is a 70 year old Caucasian woman with past medical history significant for hypertension and dyslipidemia. She is on antihypertensive therapy with ACE inhibitor (enalapril and atenolol).  She presents with about 7-8 days of diarrhea with diminished oral intake while continuing taking enalapril.  In the interim, she was referred to the Emergency Room from her primary care doctor's office for intravenous fluids given her self admission of dehydration.  Yesterday on admission to the emergency room, was noted to have an elevated creatinine of 15, together with hyperkalemia and hyponatremia. In spite of intravenous fluids overnight, creatinine remains elevated at about 15.  Her baseline creatinine ranges from 1.5 all the way up to 1.8.  Chronic issues with hyperkalemia are noted.  She denies any recent NSAID use, denies any recent intravenous contrast, denies any recent antibiotic use and states chronic PPI use.  Denies any emerging skin rash, denies sinusitis/asthma-type symptoms and denies any epistaxis.  She denies any pneumonia, cough, or sputum production.  She denies any emerging skin rash, hematuria, or foamy urine.  Denies dysuria, urgency, or frequency.  Denies any prior history of acute renal insufficiency or kidney stones.  PAST MEDICAL HISTORY: 1. Chronic kidney disease stage III (creatinine ranging  1.5-1.8). 2. Hypertension. 3. Dyslipidemia. 4. Gastroesophageal reflux disease associated with hiatal hernia. 5. Status post total abdominal hysterectomy.  MEDICATIONS: 1. Atenolol 25 mg p.o. nightly. 2. Estradiol 2 mg p.o. nightly. 3. Lipitor 20 mg p.o. nightly. 4. Enalapril 20 mg p.o. nightly. 5. Magnesium oxide 400 mg daily. 6. Fish oil 1000 mg daily. 7. Calcium carbonate 500 mg p.o. daily 8. Detrol LA 4 mg p.o. every other day. 9. Aspirin 81 mg p.o. daily. 10.Nexium 40 mg p.o. nightly.  ALLERGIES:  FELDENE apparently causes skin rash.  SOCIAL HISTORY:  Resides here in [place] by herself.  Has very supportive sisters who are in the room during the time of interview. Denies any tobacco, alcohol, or illicit drug abuse.  FAMILY HISTORY:  Negative for chronic kidney disease.  Positive for hypertension.  REVIEW OF SYSTEMS:  As per history of presenting illness.  Other systems reviewed are negative.  PHYSICAL EXAMINATION:  VITAL SIGNS:  Temperature 97.4 degrees Fahrenheit, pulse 64, respiratory rate 17, blood pressure 102/48, oxygen saturation 98% on room air. GENERAL EVALUATION:  Obese middle-aged Caucasian woman resting comfortably in bed. HEAD, NECK and ENT SYSTEMS:  Head is normocephalic and atraumatic. Pupils are bilaterally equal and reactive to light.  Extraocular muscle movements are normal. NECK:  Supple without any obvious JVD or  goiter.  No lymphadenopathy. No bruit. CARDIOVASCULAR SYSTEM:  Pulses regular in rate and rhythm.  Heart sounds S1 and S2 are normal without any obvious murmurs, rubs or scallops. RESPIRATORY:  Both lung fields are clear to auscultation.  No rales, retractions, or rhonchi. ABDOMEN:  Soft, flat, mildly tender over the left lower quadrant.  No organomegaly. EXTREMITIES:  No edema is palpable over either lower extremity.  No peculiar rashes or petechiae noted.  LABORATORY DATA:  Sodium 127, potassium 4.6, bicarbonate 10, BUN  82, creatinine 1.5, glucose 75, calcium 6.7.  Hemoglobin 8.3, hematocrit 23.3, white cell count 17,800, platelet count 316,000.  C4 19, C3 115 (both within normal limits).  Lactic acid level 1.2, antinuclear antibody negative.  C. difficile by PCR negative.  Urinalysis reveals a specific gravity of 1.015, pH of 5, negative protein, negative blood, negative nitrites, trace amount of leukocytes.  Urine microscopy reveals 0-2 white cells per high-power field and 0-2 red cells per high-power field.  Few bacteria are noted.  RADIOLOGY EVALUATIONS:  Renal ultrasound was done yesterday reveals a right kidney of 11 cm, left kidney 10.2 cm, both with mild renal cortical thinning, but normal echogenicity.  No hydronephrosis or focal lesions noted.  Chest x-ray done yesterday reveals a normal chest x-ray.  ASSESSMENT AND PLAN: 1. Acute renal failure on chronic kidney disease stage III.  It     appears that she has a background of hypertensive nephrosclerosis     with chronic kidney disease stage III with attendant hyperkalemia.     Based on the current history as well as timeline of events and the     available database so far including her urine studies/serologicalstudies, I suspect that she has ischemic acute tubular necrosis     from a sustained prerenal state.  Renal ultrasound is consistent     with chronic kidney disease and does not reveal any obstruction.     Serology so far include a negative antinuclear antibody, normal C3     and C4 levels.  Urinalysis sediment is bland and at this time seems     to way against the possibility of glomerulonephritis.  ANCA titers     are pending.  Would continue management with intravenous fluid     resuscitation while holding ACE inhibitor therapy.  Would monitor     her closely for potential hypervolemia/pulmonary edema.  Would     strictly monitor inputs and outputs as well as daily weights. 2. Metabolic acidosis.  She has both anion gap component as  well as     non-anion gap.  I suspect that the former is from acute renal     insufficiency where the latter is from GI losses from diarrhea.  We     will give her intravenous sodium bicarbonate as her maintenance IV     fluids. 3. Hyponatremia.  Likely secondary to hypotonic fluid repletion of     hypertonic losses.  We will continue intravenous sodium     bicarbonate, that is isotonic while monitoring for renal recovery. 4. Gastroenteritis.  Workup and management per Primary Service.     Elmarie Shiley, MD     JP/MEDQ  D:  08/10/2011  T:  08/10/2011  Job:  [id number]  cc:   Unk Pinto, M.D.  Electronically Signed by Elmarie Shiley MD on 08/30/2011 04:20:56 PM

## 2011-09-03 NOTE — Discharge Summary (Signed)
NAMESEFERINA, Cantu                 ACCOUNT NO.:  [id number]  MEDICAL RECORD NO.:  [id number]  LOCATION:  [id number]                         FACILITY:  [facility]  PHYSICIAN:  Reyne Dumas, MD       DATE OF BIRTH:  [birth year]  DATE OF ADMISSION:  08/09/2011 DATE OF DISCHARGE:  08/17/2011                              DISCHARGE SUMMARY   PRIMARY CARE PHYSICIAN:  Tammi Sou, MD  DISCHARGE DIAGNOSES: 1. Acute renal failure, chronic kidney disease stage III with a     baseline creatinine of 1.5 to 1.8. 2. Acute renal failure secondary to ACE inhibitor/diarrhea-induced     hypertension and severe dehydration. 3. Status post hemodialysis catheter placement. 4. Subsequent discontinuation of hemodialysis catheter with     spontaneous return of renal function. 5. Creatinine now down to 4.22. 6. Nonoliguric renal failure. 7. Diarrhea with negative workup thus far. 8. Severe refractory metabolic acidosis, now resolved. 9. Anemia status post transfusion. 10.Persistent leukocytosis likely related to diarrhea. 11.Urinary tract infection with growth of staph coag negative species,     sensitive to gentamicin, nitrofurantoin, oxacillin, rifampin,     vancomycin, and tetracycline only.  PROCEDURES:  Renal ultrasound on August 09, 2011, shows mild renal cortical thinning, no lesions, no hydronephrosis, placement of the right IJ hemodialysis catheter on September 8, currently discontinued.  CONSULTATIONS:  Nephrology consultation for acute renal failure.  SUBJECTIVE:  A 70 year old female with longstanding history of hypertension, gastroesophageal reflux disease on Lasix, and enalapril in the outpatient setting presented to the hospital with diarrhea, which was persistent for about 8 days.  The patient continues to take her blood pressure medications during the initial phase of her severe diarrheal illness.  The patient was on enalapril.  Nonetheless, she started experiencing severe weakness  and dizziness.  She went to primary care office for evaluation and was found to have a creatinine of 15 and a BUN of 81 and bicarb of 12 and anion gap of 19 and a sodium of 125 and she was sent to the ER for further evaluation.  A renal ultrasound did not reveal any reversible hydronephrosis.  The patient was admitted for acute renal failure.  Nephrology was consulted.  Initial attempt to manage the patient with IV fluids and hydration failed because the patient developed acute severe pulmonary edema on August 10, 2011, as a result hemodialysis catheter had to be placed and semi-emergently the patient was initiated on dialysis on August 11, 2011.  The patient tolerated the hemodialysis quite well.  She converted to nonoliguric renal failure and the creatinine has continued to decline since then. The patient had the hemodialysis catheter discontinued.  We will avoid ARB/ACE inhibitors in the future.  The patient was found to have significant severe watery diarrhea upon admission and stool studies thus far including C. diff, Giardia, Campylobacter have remained negative.  The patient has not had any bowel movement today and only 2 to 3 small semi-formed bowel movements yesterday.  Her diet has been advanced and she is tolerating a p.o. diet.  She has been ruled out for C. diff colitis.  No GI consultation was obtained as the patient's diarrhea was  self-limiting and has now resolved.  Urinalysis revealed more than 1000 colonies of coag negative staph with sensitivity as above.  She currently does not have any urinary symptoms at this point and repeat urinalysis was negative.  PHYSICAL EXAMINATION:  VITAL SIGNS:  Blood pressure 133/77, respirations 20, pulse 69, temperature 98.0, 98% on room air. GENERAL:  Currently comfortable in no acute cardiopulmonary distress. HEENT:  Pupils equal and reactive to light.  Extraocular movements intact. LUNGS:  Clear to auscultation bilaterally.   No wheezes, crackles, or rhonchi. CARDIOVASCULAR:  Regular rate and rhythm.  No murmurs, rubs, or gallops. ABDOMEN:  Soft, nontender, nondistended. EXTREMITIES:  Without cyanosis, clubbing, or edema. NEUROLOGIC:  Cranial nerves II through XII grossly intact.     Reyne Dumas, MD     NA/MEDQ  D:  08/17/2011  T:  08/17/2011  Job:  [id number]  Electronically Signed by Reyne Dumas MD on 09/03/2011 02:50:16 PM

## 2011-09-03 NOTE — Discharge Summary (Signed)
  [name], Kelly Cantu                 ACCOUNT NO.:  [id number]  MEDICAL RECORD NO.:  [id number]  LOCATION:                                 FACILITY:  PHYSICIAN:  Reyne Dumas, MD       DATE OF BIRTH:  01-19-1941  DATE OF ADMISSION: DATE OF DISCHARGE:                              DISCHARGE SUMMARY   ADDENDUM:  DISCHARGE MEDICATIONS: 1. Promethazine 12.5 mg p.o. q.6 p.r.n. 2. Aspirin 81 mg p.o. daily. 3. Atenolol 25 mg at bedtime. 4. Calcium carbonate 1 tablet p.o. daily. 5. Detrol LA 1 capsule p.o. every other day. 6. Fish oil 1 capsule p.o. daily. 7. Lipitor 20 mg p.o. daily. 8. Nexium 40 mg p.o. daily. 9. Vitamin D 1 tablet p.o. daily.     Reyne Dumas, MD     NA/MEDQ  D:  08/17/2011  T:  08/17/2011  Job:  [id number]  Electronically Signed by Reyne Dumas MD on 09/03/2011 02:50:22 PM

## 2011-09-17 ENCOUNTER — Other Ambulatory Visit (HOSPITAL_COMMUNITY): Payer: Self-pay | Admitting: Internal Medicine

## 2011-09-17 DIAGNOSIS — Z1231 Encounter for screening mammogram for malignant neoplasm of breast: Secondary | ICD-10-CM

## 2011-10-03 ENCOUNTER — Ambulatory Visit (HOSPITAL_COMMUNITY)
Admission: RE | Admit: 2011-10-03 | Discharge: 2011-10-03 | Disposition: A | Discharge status: Home / Self Care | Payer: Medicare Other | Source: Ambulatory Visit | Attending: Internal Medicine | Admitting: Internal Medicine

## 2011-10-03 DIAGNOSIS — Z1231 Encounter for screening mammogram for malignant neoplasm of breast: Secondary | ICD-10-CM | POA: Insufficient documentation

## 2011-12-17 DIAGNOSIS — I10 Essential (primary) hypertension: Secondary | ICD-10-CM | POA: Diagnosis not present

## 2011-12-17 DIAGNOSIS — R7309 Other abnormal glucose: Secondary | ICD-10-CM | POA: Diagnosis not present

## 2011-12-17 DIAGNOSIS — R350 Frequency of micturition: Secondary | ICD-10-CM | POA: Diagnosis not present

## 2011-12-17 DIAGNOSIS — E782 Mixed hyperlipidemia: Secondary | ICD-10-CM | POA: Diagnosis not present

## 2011-12-17 DIAGNOSIS — E559 Vitamin D deficiency, unspecified: Secondary | ICD-10-CM | POA: Diagnosis not present

## 2012-01-01 DIAGNOSIS — I120 Hypertensive chronic kidney disease with stage 5 chronic kidney disease or end stage renal disease: Secondary | ICD-10-CM | POA: Diagnosis not present

## 2012-01-01 DIAGNOSIS — N179 Acute kidney failure, unspecified: Secondary | ICD-10-CM | POA: Diagnosis not present

## 2012-01-10 DIAGNOSIS — N2581 Secondary hyperparathyroidism of renal origin: Secondary | ICD-10-CM | POA: Diagnosis not present

## 2012-01-10 DIAGNOSIS — I129 Hypertensive chronic kidney disease with stage 1 through stage 4 chronic kidney disease, or unspecified chronic kidney disease: Secondary | ICD-10-CM | POA: Diagnosis not present

## 2012-01-10 DIAGNOSIS — D649 Anemia, unspecified: Secondary | ICD-10-CM | POA: Diagnosis not present

## 2012-01-24 DIAGNOSIS — E213 Hyperparathyroidism, unspecified: Secondary | ICD-10-CM | POA: Diagnosis not present

## 2012-01-24 DIAGNOSIS — D649 Anemia, unspecified: Secondary | ICD-10-CM | POA: Diagnosis not present

## 2012-01-24 DIAGNOSIS — I129 Hypertensive chronic kidney disease with stage 1 through stage 4 chronic kidney disease, or unspecified chronic kidney disease: Secondary | ICD-10-CM | POA: Diagnosis not present

## 2012-01-31 DIAGNOSIS — I10 Essential (primary) hypertension: Secondary | ICD-10-CM | POA: Diagnosis not present

## 2012-01-31 DIAGNOSIS — E782 Mixed hyperlipidemia: Secondary | ICD-10-CM | POA: Diagnosis not present

## 2012-01-31 DIAGNOSIS — R7309 Other abnormal glucose: Secondary | ICD-10-CM | POA: Diagnosis not present

## 2012-03-12 DIAGNOSIS — E782 Mixed hyperlipidemia: Secondary | ICD-10-CM | POA: Diagnosis not present

## 2012-03-12 DIAGNOSIS — I10 Essential (primary) hypertension: Secondary | ICD-10-CM | POA: Diagnosis not present

## 2012-03-12 DIAGNOSIS — Z79899 Other long term (current) drug therapy: Secondary | ICD-10-CM | POA: Diagnosis not present

## 2012-03-12 DIAGNOSIS — E559 Vitamin D deficiency, unspecified: Secondary | ICD-10-CM | POA: Diagnosis not present

## 2012-03-12 DIAGNOSIS — R7309 Other abnormal glucose: Secondary | ICD-10-CM | POA: Diagnosis not present

## 2012-05-01 DIAGNOSIS — R7989 Other specified abnormal findings of blood chemistry: Secondary | ICD-10-CM | POA: Diagnosis not present

## 2012-05-01 DIAGNOSIS — Z79899 Other long term (current) drug therapy: Secondary | ICD-10-CM | POA: Diagnosis not present

## 2012-05-01 DIAGNOSIS — M5430 Sciatica, unspecified side: Secondary | ICD-10-CM | POA: Diagnosis not present

## 2012-05-15 DIAGNOSIS — E785 Hyperlipidemia, unspecified: Secondary | ICD-10-CM | POA: Diagnosis not present

## 2012-05-15 DIAGNOSIS — I10 Essential (primary) hypertension: Secondary | ICD-10-CM | POA: Diagnosis not present

## 2012-05-15 DIAGNOSIS — I129 Hypertensive chronic kidney disease with stage 1 through stage 4 chronic kidney disease, or unspecified chronic kidney disease: Secondary | ICD-10-CM | POA: Diagnosis not present

## 2012-05-15 DIAGNOSIS — N2581 Secondary hyperparathyroidism of renal origin: Secondary | ICD-10-CM | POA: Diagnosis not present

## 2012-06-25 DIAGNOSIS — E782 Mixed hyperlipidemia: Secondary | ICD-10-CM | POA: Diagnosis not present

## 2012-06-25 DIAGNOSIS — E559 Vitamin D deficiency, unspecified: Secondary | ICD-10-CM | POA: Diagnosis not present

## 2012-06-25 DIAGNOSIS — I10 Essential (primary) hypertension: Secondary | ICD-10-CM | POA: Diagnosis not present

## 2012-06-25 DIAGNOSIS — R7309 Other abnormal glucose: Secondary | ICD-10-CM | POA: Diagnosis not present

## 2012-06-25 DIAGNOSIS — Z79899 Other long term (current) drug therapy: Secondary | ICD-10-CM | POA: Diagnosis not present

## 2012-08-14 DIAGNOSIS — S63509A Unspecified sprain of unspecified wrist, initial encounter: Secondary | ICD-10-CM | POA: Diagnosis not present

## 2012-09-23 DIAGNOSIS — E782 Mixed hyperlipidemia: Secondary | ICD-10-CM | POA: Diagnosis not present

## 2012-09-23 DIAGNOSIS — Z79899 Other long term (current) drug therapy: Secondary | ICD-10-CM | POA: Diagnosis not present

## 2012-09-23 DIAGNOSIS — R7309 Other abnormal glucose: Secondary | ICD-10-CM | POA: Diagnosis not present

## 2012-09-23 DIAGNOSIS — I10 Essential (primary) hypertension: Secondary | ICD-10-CM | POA: Diagnosis not present

## 2012-09-23 DIAGNOSIS — Z1212 Encounter for screening for malignant neoplasm of rectum: Secondary | ICD-10-CM | POA: Diagnosis not present

## 2012-09-23 DIAGNOSIS — E559 Vitamin D deficiency, unspecified: Secondary | ICD-10-CM | POA: Diagnosis not present

## 2012-09-23 DIAGNOSIS — Z23 Encounter for immunization: Secondary | ICD-10-CM | POA: Diagnosis not present

## 2012-10-09 ENCOUNTER — Other Ambulatory Visit (HOSPITAL_COMMUNITY): Payer: Self-pay | Admitting: Internal Medicine

## 2012-10-09 DIAGNOSIS — Z1231 Encounter for screening mammogram for malignant neoplasm of breast: Secondary | ICD-10-CM

## 2012-10-10 ENCOUNTER — Ambulatory Visit (HOSPITAL_COMMUNITY)
Admission: RE | Admit: 2012-10-10 | Discharge: 2012-10-10 | Disposition: A | Discharge status: Home / Self Care | Payer: Medicare Other | Source: Ambulatory Visit | Attending: Internal Medicine | Admitting: Internal Medicine

## 2012-10-10 DIAGNOSIS — Z1231 Encounter for screening mammogram for malignant neoplasm of breast: Secondary | ICD-10-CM | POA: Insufficient documentation

## 2012-12-22 DIAGNOSIS — N184 Chronic kidney disease, stage 4 (severe): Secondary | ICD-10-CM | POA: Diagnosis not present

## 2012-12-22 DIAGNOSIS — I129 Hypertensive chronic kidney disease with stage 1 through stage 4 chronic kidney disease, or unspecified chronic kidney disease: Secondary | ICD-10-CM | POA: Diagnosis not present

## 2012-12-22 DIAGNOSIS — D649 Anemia, unspecified: Secondary | ICD-10-CM | POA: Diagnosis not present

## 2012-12-22 DIAGNOSIS — N2581 Secondary hyperparathyroidism of renal origin: Secondary | ICD-10-CM | POA: Diagnosis not present

## 2012-12-22 DIAGNOSIS — I10 Essential (primary) hypertension: Secondary | ICD-10-CM | POA: Diagnosis not present

## 2012-12-22 DIAGNOSIS — E213 Hyperparathyroidism, unspecified: Secondary | ICD-10-CM | POA: Diagnosis not present

## 2013-01-06 DIAGNOSIS — I10 Essential (primary) hypertension: Secondary | ICD-10-CM | POA: Diagnosis not present

## 2013-01-06 DIAGNOSIS — E559 Vitamin D deficiency, unspecified: Secondary | ICD-10-CM | POA: Diagnosis not present

## 2013-01-06 DIAGNOSIS — Z79899 Other long term (current) drug therapy: Secondary | ICD-10-CM | POA: Diagnosis not present

## 2013-01-06 DIAGNOSIS — E782 Mixed hyperlipidemia: Secondary | ICD-10-CM | POA: Diagnosis not present

## 2013-01-06 DIAGNOSIS — R7309 Other abnormal glucose: Secondary | ICD-10-CM | POA: Diagnosis not present

## 2013-01-19 DIAGNOSIS — J309 Allergic rhinitis, unspecified: Secondary | ICD-10-CM | POA: Diagnosis not present

## 2013-01-19 DIAGNOSIS — R42 Dizziness and giddiness: Secondary | ICD-10-CM | POA: Diagnosis not present

## 2013-02-26 DIAGNOSIS — M545 Low back pain: Secondary | ICD-10-CM | POA: Diagnosis not present

## 2013-02-26 DIAGNOSIS — M5430 Sciatica, unspecified side: Secondary | ICD-10-CM | POA: Diagnosis not present

## 2013-04-01 DIAGNOSIS — E538 Deficiency of other specified B group vitamins: Secondary | ICD-10-CM | POA: Diagnosis not present

## 2013-04-01 DIAGNOSIS — Z79899 Other long term (current) drug therapy: Secondary | ICD-10-CM | POA: Diagnosis not present

## 2013-04-01 DIAGNOSIS — I10 Essential (primary) hypertension: Secondary | ICD-10-CM | POA: Diagnosis not present

## 2013-04-01 DIAGNOSIS — D539 Nutritional anemia, unspecified: Secondary | ICD-10-CM | POA: Diagnosis not present

## 2013-04-01 DIAGNOSIS — R7309 Other abnormal glucose: Secondary | ICD-10-CM | POA: Diagnosis not present

## 2013-04-01 DIAGNOSIS — E782 Mixed hyperlipidemia: Secondary | ICD-10-CM | POA: Diagnosis not present

## 2013-04-20 DIAGNOSIS — N2581 Secondary hyperparathyroidism of renal origin: Secondary | ICD-10-CM | POA: Diagnosis not present

## 2013-04-20 DIAGNOSIS — D649 Anemia, unspecified: Secondary | ICD-10-CM | POA: Diagnosis not present

## 2013-04-20 DIAGNOSIS — N184 Chronic kidney disease, stage 4 (severe): Secondary | ICD-10-CM | POA: Diagnosis not present

## 2013-04-20 DIAGNOSIS — I129 Hypertensive chronic kidney disease with stage 1 through stage 4 chronic kidney disease, or unspecified chronic kidney disease: Secondary | ICD-10-CM | POA: Diagnosis not present

## 2013-07-06 DIAGNOSIS — I10 Essential (primary) hypertension: Secondary | ICD-10-CM | POA: Diagnosis not present

## 2013-07-06 DIAGNOSIS — R7309 Other abnormal glucose: Secondary | ICD-10-CM | POA: Diagnosis not present

## 2013-07-06 DIAGNOSIS — E782 Mixed hyperlipidemia: Secondary | ICD-10-CM | POA: Diagnosis not present

## 2013-07-06 DIAGNOSIS — Z79899 Other long term (current) drug therapy: Secondary | ICD-10-CM | POA: Diagnosis not present

## 2013-07-06 DIAGNOSIS — E559 Vitamin D deficiency, unspecified: Secondary | ICD-10-CM | POA: Diagnosis not present

## 2013-07-13 DIAGNOSIS — Z79899 Other long term (current) drug therapy: Secondary | ICD-10-CM | POA: Diagnosis not present

## 2013-07-13 DIAGNOSIS — M79609 Pain in unspecified limb: Secondary | ICD-10-CM | POA: Diagnosis not present

## 2013-08-17 DIAGNOSIS — D509 Iron deficiency anemia, unspecified: Secondary | ICD-10-CM | POA: Diagnosis not present

## 2013-08-17 DIAGNOSIS — I129 Hypertensive chronic kidney disease with stage 1 through stage 4 chronic kidney disease, or unspecified chronic kidney disease: Secondary | ICD-10-CM | POA: Diagnosis not present

## 2013-08-17 DIAGNOSIS — D649 Anemia, unspecified: Secondary | ICD-10-CM | POA: Diagnosis not present

## 2013-08-17 DIAGNOSIS — E785 Hyperlipidemia, unspecified: Secondary | ICD-10-CM | POA: Diagnosis not present

## 2013-08-17 DIAGNOSIS — E213 Hyperparathyroidism, unspecified: Secondary | ICD-10-CM | POA: Diagnosis not present

## 2013-08-17 DIAGNOSIS — N2581 Secondary hyperparathyroidism of renal origin: Secondary | ICD-10-CM | POA: Diagnosis not present

## 2013-09-28 DIAGNOSIS — Z1212 Encounter for screening for malignant neoplasm of rectum: Secondary | ICD-10-CM | POA: Diagnosis not present

## 2013-09-28 DIAGNOSIS — Z79899 Other long term (current) drug therapy: Secondary | ICD-10-CM | POA: Diagnosis not present

## 2013-09-28 DIAGNOSIS — E559 Vitamin D deficiency, unspecified: Secondary | ICD-10-CM | POA: Diagnosis not present

## 2013-09-28 DIAGNOSIS — I10 Essential (primary) hypertension: Secondary | ICD-10-CM | POA: Diagnosis not present

## 2013-09-28 DIAGNOSIS — R7309 Other abnormal glucose: Secondary | ICD-10-CM | POA: Diagnosis not present

## 2013-09-28 DIAGNOSIS — E782 Mixed hyperlipidemia: Secondary | ICD-10-CM | POA: Diagnosis not present

## 2013-09-30 ENCOUNTER — Other Ambulatory Visit: Payer: Self-pay | Admitting: Internal Medicine

## 2013-09-30 DIAGNOSIS — Z1231 Encounter for screening mammogram for malignant neoplasm of breast: Secondary | ICD-10-CM

## 2013-10-15 ENCOUNTER — Other Ambulatory Visit: Payer: Self-pay | Admitting: Internal Medicine

## 2013-10-15 ENCOUNTER — Ambulatory Visit (HOSPITAL_COMMUNITY)
Admission: RE | Admit: 2013-10-15 | Discharge: 2013-10-15 | Disposition: A | Discharge status: Home / Self Care | Payer: Medicare Other | Source: Ambulatory Visit | Attending: Internal Medicine | Admitting: Internal Medicine

## 2013-10-15 DIAGNOSIS — Z1231 Encounter for screening mammogram for malignant neoplasm of breast: Secondary | ICD-10-CM | POA: Insufficient documentation

## 2013-10-16 NOTE — Progress Notes (Signed)
c all pt to schedule [facility]

## 2014-01-11 DIAGNOSIS — N2581 Secondary hyperparathyroidism of renal origin: Secondary | ICD-10-CM | POA: Diagnosis not present

## 2014-01-11 DIAGNOSIS — I129 Hypertensive chronic kidney disease with stage 1 through stage 4 chronic kidney disease, or unspecified chronic kidney disease: Secondary | ICD-10-CM | POA: Diagnosis not present

## 2014-01-11 DIAGNOSIS — D631 Anemia in chronic kidney disease: Secondary | ICD-10-CM | POA: Diagnosis not present

## 2014-01-11 DIAGNOSIS — N183 Chronic kidney disease, stage 3 unspecified: Secondary | ICD-10-CM | POA: Diagnosis not present

## 2014-03-24 DIAGNOSIS — H26499 Other secondary cataract, unspecified eye: Secondary | ICD-10-CM | POA: Diagnosis not present

## 2014-04-01 ENCOUNTER — Other Ambulatory Visit: Payer: Self-pay | Admitting: Internal Medicine

## 2014-04-21 ENCOUNTER — Ambulatory Visit: Payer: Self-pay | Admitting: Physician Assistant

## 2014-04-26 DIAGNOSIS — R7303 Prediabetes: Secondary | ICD-10-CM

## 2014-04-26 DIAGNOSIS — M1990 Unspecified osteoarthritis, unspecified site: Secondary | ICD-10-CM | POA: Insufficient documentation

## 2014-04-26 DIAGNOSIS — K589 Irritable bowel syndrome without diarrhea: Secondary | ICD-10-CM | POA: Insufficient documentation

## 2014-04-26 DIAGNOSIS — I10 Essential (primary) hypertension: Secondary | ICD-10-CM | POA: Insufficient documentation

## 2014-04-26 DIAGNOSIS — M5136 Other intervertebral disc degeneration, lumbar region: Secondary | ICD-10-CM | POA: Insufficient documentation

## 2014-04-26 DIAGNOSIS — E782 Mixed hyperlipidemia: Secondary | ICD-10-CM | POA: Insufficient documentation

## 2014-04-26 DIAGNOSIS — R7309 Other abnormal glucose: Secondary | ICD-10-CM | POA: Insufficient documentation

## 2014-04-26 DIAGNOSIS — E1169 Type 2 diabetes mellitus with other specified complication: Secondary | ICD-10-CM | POA: Insufficient documentation

## 2014-04-26 DIAGNOSIS — K219 Gastro-esophageal reflux disease without esophagitis: Secondary | ICD-10-CM | POA: Insufficient documentation

## 2014-04-27 ENCOUNTER — Encounter: Payer: Self-pay | Admitting: Physician Assistant

## 2014-04-27 ENCOUNTER — Ambulatory Visit (INDEPENDENT_AMBULATORY_CARE_PROVIDER_SITE_OTHER): Payer: Medicare Other | Admitting: Physician Assistant

## 2014-04-27 VITALS — BP 138/84 | HR 56 | Temp 98.2°F | Resp 16 | Ht 64.5 in | Wt 181.0 lb

## 2014-04-27 DIAGNOSIS — R7309 Other abnormal glucose: Secondary | ICD-10-CM | POA: Diagnosis not present

## 2014-04-27 DIAGNOSIS — I10 Essential (primary) hypertension: Secondary | ICD-10-CM | POA: Diagnosis not present

## 2014-04-27 DIAGNOSIS — Z79899 Other long term (current) drug therapy: Secondary | ICD-10-CM

## 2014-04-27 DIAGNOSIS — E785 Hyperlipidemia, unspecified: Secondary | ICD-10-CM

## 2014-04-27 DIAGNOSIS — Z0000 Encounter for general adult medical examination without abnormal findings: Secondary | ICD-10-CM

## 2014-04-27 DIAGNOSIS — Z1331 Encounter for screening for depression: Secondary | ICD-10-CM

## 2014-04-27 DIAGNOSIS — E559 Vitamin D deficiency, unspecified: Secondary | ICD-10-CM

## 2014-04-27 DIAGNOSIS — R7303 Prediabetes: Secondary | ICD-10-CM

## 2014-04-27 NOTE — Progress Notes (Signed)
Assessment:   1. Hypertension - CBC with Differential - BASIC METABOLIC PANEL WITH GFR - Hepatic function panel - TSH  2. Prediabetes Discussed general issues about diabetes pathophysiology and management., Educational material distributed., Suggested low cholesterol diet., Encouraged aerobic exercise., Discussed foot care., Reminded to get yearly retinal exam. - Hemoglobin A1c - Insulin, fasting - HM DIABETES FOOT EXAM  3. Hyperlipidemia - Lipid panel  4. OA hands-  given voltern gel, can refer for injections.   5.  Encounter for long-term (current) use of other medications - Magnesium  6. Unspecified vitamin D deficiency   Plan:   During the course of the visit the patient was educated and counseled about appropriate screening and preventive services including:    Pneumococcal vaccine   Influenza vaccine  Td vaccine  Screening electrocardiogram  Screening mammography  Bone densitometry screening  Colorectal cancer screening  Diabetes screening  Glaucoma screening  Nutrition counseling   Screening recommendations, referrals:  Vaccinations: Tdap vaccine due 2017 Influenza vaccine not indicated Pneumococcal vaccine not indicated Shingles vaccine not indicated Hep B vaccine not indicated  Nutrition assessed and recommended  Colonoscopy ordered Mammogram up to date Pap smear not indicated Pelvic exam not indicated Recommended yearly ophthalmology/optometry visit for glaucoma screening and checkup Recommended yearly dental visit for hygiene and checkup Advanced directives - requested  Conditions/risks identified: BMI: Discussed weight loss, diet, and increase physical activity.  Increase physical activity: AHA recommends 150 minutes of physical activity a week.  Medications reviewed DEXA- ordered Diabetes is at goal, ACE/ARB therapy: Yes. Urinary Incontinence is not an issue: discussed non pharmacology and pharmacology options.  Fall risk: low-  discussed PT, home fall assessment, medications.    Subjective:   Kelly Cantu is a 73 y.o. female who presents for Medicare Annual Wellness Visit and 3 month follow up on hypertension, prediabetes, hyperlipidemia, vitamin D def.  Date of last medicare wellness visit is unknown.   Her blood pressure has been controlled at home, today their BP is BP: 138/84 mmHg She does workout, walks 2-3 times a week. She denies chest pain, shortness of breath, dizziness.  She is on cholesterol medication and denies myalgias. Her cholesterol is at goal. The cholesterol last visit was:  LDL 82.  She has been working on diet and exercise for prediabetes, and denies polydipsia, polyuria and visual disturbances. 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 Jimmy Footman Patient is on Vitamin D supplement. She states she is doing well but has bilateral hand pain with nodules, takes tylenol for it but can not take NSAIDs.  Names of Other Physician/Practitioners you currently use: 1. [facility]- here for primary care 2. Dr. Nicki Reaper, eye doctor, last visit 2 months 3. Dr. Randol Kern, dentist, last visit q 6 months 3. Dr. Talbert Forest, eye 4. Dr. Carlean Purl, GI Patient Care Team: Unk Pinto, MD as PCP - General (Internal Medicine) Placido Sou, MD as Consulting Physician (Nephrology)  Medication Review Current Outpatient Prescriptions on File Prior to Visit  Medication Sig Dispense Refill  . allopurinol (ZYLOPRIM) 300 MG tablet Take 300 mg by mouth daily.      Marland Kitchen atenolol (TENORMIN) 25 MG tablet Take by mouth daily.      Marland Kitchen atorvastatin (LIPITOR) 20 MG tablet TAKE 1 TABLET DAILY FOR CHOLESTEROL  30 tablet  0  . Cholecalciferol (VITAMIN D PO) Take by mouth daily.      Marland Kitchen  esomeprazole (NEXIUM) 40 MG capsule Take 40 mg by mouth daily at 12 noon.      . furosemide (LASIX) 40 MG tablet TAKE 1 TABLET DAILY FOR BLOOD  PRESSURE AND FLUID  30 tablet  0  . Omega-3 Fatty Acids (FISH OIL PO) Take by mouth daily.       No current facility-administered medications on file prior to visit.    Current Problems (verified) Patient Active Problem List   Diagnosis Date Noted  . Hyperlipidemia   . Hypertension   . Prediabetes   . GERD (gastroesophageal reflux disease)   . DJD (degenerative joint disease)   . DDD (degenerative disc disease), lumbar   . IBS (irritable bowel syndrome)     Screening Tests Health Maintenance  Topic Date Due  . Colonoscopy  07/29/1992  . Influenza Vaccine  07/03/2014  . Tetanus/tdap  09/02/2014  . Mammogram  10/10/2014  . Pneumococcal Polysaccharide Vaccine Age 55 And Over  Completed  . Zostavax  Completed     Immunization History  Administered Date(s) Administered  . Influenza Split 09/28/2013  . Pneumococcal Polysaccharide-23 09/23/2012  . Td 09/02/2004  . Zoster 10/13/2013    Preventative care: Last colonoscopy: 2005 with Dr. Carlean Purl DUE Last mammogram: 10/2013 Last pap smear/pelvic exam: remote DEXA: 2011 DUE  Prior vaccinations: TD or Tdap: 2007 DUE 2017  Influenza: 2014 Pneumococcal: 2013 Shingles/Zostavax: 2014  History reviewed: allergies, current medications, past family history, past medical history, past social history, past surgical history and problem list  Risk Factors: Osteoporosis: postmenopausal estrogen deficiency and dietary calcium and/or vitamin D deficiency History of fracture in the past year: no  Tobacco History  Substance Use Topics  . Smoking status: Former Research scientist (life sciences)  . Smokeless tobacco: Not on file  . Alcohol Use: No   She does not smoke.  Patient is a former smoker. Are there smokers in your home (other than you)?  No  Alcohol Current alcohol use: none  Caffeine Current caffeine use: coffee 1-2 /day  Exercise Current exercise: walking  Nutrition/Diet Current diet: in general, a "healthy" diet    Cardiac risk  factors: advanced age (older than 47 for men, 36 for women), dyslipidemia, hypertension and sedentary lifestyle.  Depression Screen (Note: if answer to either of the following is "Yes", a more complete depression screening is indicated)   Q1: Over the past two weeks, have you felt down, depressed or hopeless? No  Q2: Over the past two weeks, have you felt little interest or pleasure in doing things? No  Have you lost interest or pleasure in daily life? No  Do you often feel hopeless? No  Do you cry easily over simple problems? No  Activities of Daily Living In your present state of health, do you have any difficulty performing the following activities?:  Driving? No Managing money?  No Feeding yourself? No Getting from bed to chair? No Climbing a flight of stairs? No Preparing food and eating?: No Bathing or showering? No Getting dressed: No Getting to the toilet? No Using the toilet:No Moving around from place to place: No In the past year have you fallen or had a near fall?:No   Are you sexually active?  No  Do you have more than one partner?  No  Vision Difficulties: No  Hearing Difficulties: No Do you often ask people to speak up or repeat themselves? No Do you experience ringing or noises in your ears? No Do you have difficulty understanding soft or whispered voices? No  Cognition  Do you feel that you have a problem with memory?No  Do you often misplace items? No  Do you feel safe at home?  Yes  Advanced directives Does patient have a Petros? Yes Does patient have a Living Will? Yes   Objective:   Blood pressure 138/84, pulse 56, temperature 98.2 F (36.8 C), resp. rate 16, height 5' 4.5" (1.638 m), weight 181 lb (82.101 kg). Body mass index is 30.6 kg/(m^2).  General appearance: alert, no distress, WD/WN,  female Cognitive Testing  Alert? Yes  Normal Appearance?Yes  Oriented to person? Yes  Place? Yes   Time? Yes  Recall of three  objects?  Yes  Can perform simple calculations? Yes  Displays appropriate judgment?Yes  Can read the correct time from a watch face?Yes  HEENT: normocephalic, sclerae anicteric, TMs pearly, nares patent, no discharge or erythema, pharynx normal Oral cavity: MMM, no lesions Neck: supple, no lymphadenopathy, no thyromegaly, no masses Heart: RRR, normal S1, S2, no murmurs Lungs: CTA bilaterally, no wheezes, rhonchi, or rales Abdomen: +bs, soft, non tender, non distended, no masses, no hepatomegaly, no splenomegaly Musculoskeletal: nontender, no swelling, no obvious deformity Extremities: no edema, no cyanosis, no clubbing, nodules on DIP bilateral hands.  Pulses: 2+ symmetric, upper and lower extremities, normal cap refill Neurological: alert, oriented x 3, CN2-12 intact, strength normal upper extremities and lower extremities, sensation normal throughout, DTRs 2+ throughout, no cerebellar signs, gait normal Psychiatric: normal affect, behavior normal, pleasant  Breast: defer Gyn: defer Rectal: defer  Medicare Attestation I have personally reviewed: The patient's medical and social history Their use of alcohol, tobacco or illicit drugs Their current medications and supplements The patient's functional ability including ADLs,fall risks, home safety risks, cognitive, and hearing and visual impairment Diet and physical activities Evidence for depression or mood disorders  The patient's weight, height, BMI, and visual acuity have been recorded in the chart.  I have made referrals, counseling, and provided education to the patient based on review of the above and I have provided the patient with a written personalized care plan for preventive services.     Vicie Mutters, PA-C   04/27/2014

## 2014-04-27 NOTE — Patient Instructions (Signed)
Call Dr. Celesta Aver office for Colonoscopy Phone: [phone]; Will get bone density next year  Preventative Care for Adults - Female      MAINTAIN REGULAR HEALTH EXAMS:  A routine yearly physical is a good way to check in with your primary care provider about your health and preventive screening. It is also an opportunity to share updates about your health and any concerns you have, and receive a thorough all-over exam.   Most health insurance companies pay for at least some preventative services.  Check with your health plan for specific coverages.  WHAT PREVENTATIVE SERVICES DO WOMEN NEED?  Adult women should have their weight and blood pressure checked regularly.   Women age 43 and older should have their cholesterol levels checked regularly.  Women should be screened for cervical cancer with a Pap smear and pelvic exam beginning at either age 6, or 3 years after they become sexually activity.    Breast cancer screening generally begins at age 21 with a mammogram and breast exam by your primary care provider.    Beginning at age 90 and continuing to age 1, women should be screened for colorectal cancer.  Certain people may need continued testing until age 52.  Updating vaccinations is part of preventative care.  Vaccinations help protect against diseases such as the flu.  Osteoporosis is a disease in which the bones lose minerals and strength as we age. Women ages 39 and over should discuss this with their caregivers, as should women after menopause who have other risk factors.  Lab tests are generally done as part of preventative care to screen for anemia and blood disorders, to screen for problems with the kidneys and liver, to screen for bladder problems, to check blood sugar, and to check your cholesterol level.  Preventative services generally include counseling about diet, exercise, avoiding tobacco, drugs, excessive alcohol consumption, and sexually transmitted infections.      GENERAL RECOMMENDATIONS FOR GOOD HEALTH:  Healthy diet:  Eat a variety of foods, including fruit, vegetables, animal or vegetable protein, such as meat, fish, chicken, and eggs, or beans, lentils, tofu, and grains, such as rice.  Drink plenty of water daily.  Decrease saturated fat in the diet, avoid lots of red meat, processed foods, sweets, fast foods, and fried foods.  Exercise:  Aerobic exercise helps maintain good heart health. At least 30-40 minutes of moderate-intensity exercise is recommended. For example, a brisk walk that increases your heart rate and breathing. This should be done on most days of the week.   Find a type of exercise or a variety of exercises that you enjoy so that it becomes a part of your daily life.  Examples are running, walking, swimming, water aerobics, and biking.  For motivation and support, explore group exercise such as aerobic class, spin class, Zumba, Yoga,or  martial arts, etc.    Set exercise goals for yourself, such as a certain weight goal, walk or run in a race such as a 5k walk/run.  Speak to your primary care provider about exercise goals.  Disease prevention:  If you smoke or chew tobacco, find out from your caregiver how to quit. It can literally save your life, no matter how long you have been a tobacco user. If you do not use tobacco, never begin.   Maintain a healthy diet and normal weight. Increased weight leads to problems with blood pressure and diabetes.   The Body Mass Index or BMI is a way of measuring how much  of your body is fat. Having a BMI above 27 increases the risk of heart disease, diabetes, hypertension, stroke and other problems related to obesity. Your caregiver can help determine your BMI and based on it develop an exercise and dietary program to help you achieve or maintain this important measurement at a healthful level.  High blood pressure causes heart and blood vessel problems.  Persistent high blood pressure  should be treated with medicine if weight loss and exercise do not work.   Fat and cholesterol leaves deposits in your arteries that can block them. This causes heart disease and vessel disease elsewhere in your body.  If your cholesterol is found to be high, or if you have heart disease or certain other medical conditions, then you may need to have your cholesterol monitored frequently and be treated with medication.   Ask if you should have a cardiac stress test if your history suggests this. A stress test is a test done on a treadmill that looks for heart disease. This test can find disease prior to there being a problem.  Menopause can be associated with physical symptoms and risks. Hormone replacement therapy is available to decrease these. You should talk to your caregiver about whether starting or continuing to take hormones is right for you.   Osteoporosis is a disease in which the bones lose minerals and strength as we age. This can result in serious bone fractures. Risk of osteoporosis can be identified using a bone density scan. Women ages 26 and over should discuss this with their caregivers, as should women after menopause who have other risk factors. Ask your caregiver whether you should be taking a calcium supplement and Vitamin D, to reduce the rate of osteoporosis.   Avoid drinking alcohol in excess (more than two drinks per day).  Avoid use of street drugs. Do not share needles with anyone. Ask for professional help if you need assistance or instructions on stopping the use of alcohol, cigarettes, and/or drugs.  Brush your teeth twice a day with fluoride toothpaste, and floss once a day. Good oral hygiene prevents tooth decay and gum disease. The problems can be painful, unattractive, and can cause other health problems. Visit your dentist for a routine oral and dental check up and preventive care every 6-12 months.   Look at your skin regularly.  Use a mirror to look at your back.  Notify your caregivers of changes in moles, especially if there are changes in shapes, colors, a size larger than a pencil eraser, an irregular border, or development of new moles.  Safety:  Use seatbelts 100% of the time, whether driving or as a passenger.  Use safety devices such as hearing protection if you work in environments with loud noise or significant background noise.  Use safety glasses when doing any work that could send debris in to the eyes.  Use a helmet if you ride a bike or motorcycle.  Use appropriate safety gear for contact sports.  Talk to your caregiver about gun safety.  Use sunscreen with a SPF (or skin protection factor) of 15 or greater.  Lighter skinned people are at a greater risk of skin cancer. Don't forget to also wear sunglasses in order to protect your eyes from too much damaging sunlight. Damaging sunlight can accelerate cataract formation.   Practice safe sex. Use condoms. Condoms are used for birth control and to help reduce the spread of sexually transmitted infections (or STIs).  Some of the STIs are  gonorrhea (the clap), chlamydia, syphilis, trichomonas, herpes, HPV (human papilloma virus) and HIV (human immunodeficiency virus) which causes AIDS. The herpes, HIV and HPV are viral illnesses that have no cure. These can result in disability, cancer and death.   Keep carbon monoxide and smoke detectors in your home functioning at all times. Change the batteries every 6 months or use a model that plugs into the wall.   Vaccinations:  Stay up to date with your tetanus shots and other required immunizations. You should have a booster for tetanus every 10 years. Be sure to get your flu shot every year, since 5%-20% of the [place] population comes down with the flu. The flu vaccine changes each year, so being vaccinated once is not enough. Get your shot in the fall, before the flu season peaks.   Other vaccines to consider:  Human Papilloma Virus or HPV causes cancer of  the cervix, and other infections that can be transmitted from person to person. There is a vaccine for HPV, and females should get immunized between the ages of 63 and 70. It requires a series of 3 shots.   Pneumococcal vaccine to protect against certain types of pneumonia.  This is normally recommended for adults age 92 or older.  However, adults younger than 72 years old with certain underlying conditions such as diabetes, heart or lung disease should also receive the vaccine.  Shingles vaccine to protect against Varicella Zoster if you are older than age 92, or younger than 72 years old with certain underlying illness.  Hepatitis A vaccine to protect against a form of infection of the liver by a virus acquired from food.  Hepatitis B vaccine to protect against a form of infection of the liver by a virus acquired from blood or body fluids, particularly if you work in health care.  If you plan to travel internationally, check with your local health department for specific vaccination recommendations.  Cancer Screening:  Breast cancer screening is essential to preventive care for women. All women age 50 and older should perform a breast self-exam every month. At age 22 and older, women should have their caregiver complete a breast exam each year. Women at ages 84 and older should have a mammogram (x-ray film) of the breasts. Your caregiver can discuss how often you need mammograms.    Cervical cancer screening includes taking a Pap smear (sample of cells examined under a microscope) from the cervix (end of the uterus). It also includes testing for HPV (Human Papilloma Virus, which can cause cervical cancer). Screening and a pelvic exam should begin at age 61, or 3 years after a woman becomes sexually active. Screening should occur every year, with a Pap smear but no HPV testing, up to age 47. After age 35, you should have a Pap smear every 3 years with HPV testing, if no HPV was found previously.    Most routine colon cancer screening begins at the age of 63. On a yearly basis, doctors may provide special easy to use take-home tests to check for hidden blood in the stool. Sigmoidoscopy or colonoscopy can detect the earliest forms of colon cancer and is life saving. These tests use a small camera at the end of a tube to directly examine the colon. Speak to your caregiver about this at age 65, when routine screening begins (and is repeated every 5 years unless early forms of pre-cancerous polyps or small growths are found).      Bad carbs also include  fruit juice, alcohol, and sweet tea. These are empty calories that do not signal to your brain that you are full.   Please remember the good carbs are still carbs which convert into sugar. So please measure them out no more than 1/2-1 cup of rice, oatmeal, pasta, and beans.  Veggies are however free foods! Pile them on.   I like lean protein at every meal such as chicken, [place], pork chops, cottage cheese, etc. Just do not fry these meats and please center your meal around vegetable, the meats should be a side dish.   No all fruit is created equal. Please see the list below, the fruit at the bottom is higher in sugars than the fruit at the top

## 2014-04-28 LAB — INSULIN, FASTING: INSULIN FASTING, SERUM: 23 u[IU]/mL (ref 3–28)

## 2014-04-28 LAB — LIPID PANEL
Cholesterol: 156 mg/dL (ref 0–200)
HDL: 47 mg/dL (ref >39)
LDL Cholesterol: 78 mg/dL (ref 0–99)
TRIGLYCERIDES: 153 mg/dL — AB (ref <150)
Total CHOL/HDL Ratio: 3.3 Ratio
VLDL: 31 mg/dL (ref 0–40)

## 2014-04-28 LAB — CBC WITH DIFFERENTIAL/PLATELET
BASOS ABS: 0 10*3/uL (ref 0.0–0.1)
BASOS PCT: 0 % (ref 0–1)
EOS ABS: 0.1 10*3/uL (ref 0.0–0.7)
Eosinophils Relative: 1 % (ref 0–5)
HCT: 36.8 % (ref 36.0–46.0)
Hemoglobin: 12.6 g/dL (ref 12.0–15.0)
Lymphocytes Relative: 19 % (ref 12–46)
Lymphs Abs: 1.5 10*3/uL (ref 0.7–4.0)
MCH: 29.5 pg (ref 26.0–34.0)
MCHC: 34.2 g/dL (ref 30.0–36.0)
MCV: 86.2 fL (ref 78.0–100.0)
MONOS PCT: 6 % (ref 3–12)
Monocytes Absolute: 0.5 10*3/uL (ref 0.1–1.0)
NEUTROS ABS: 5.8 10*3/uL (ref 1.7–7.7)
NEUTROS PCT: 74 % (ref 43–77)
PLATELETS: 286 10*3/uL (ref 150–400)
RBC: 4.27 MIL/uL (ref 3.87–5.11)
RDW: 14.7 % (ref 11.5–15.5)
WBC: 7.8 10*3/uL (ref 4.0–10.5)

## 2014-04-28 LAB — BASIC METABOLIC PANEL WITH GFR
BUN: 35 mg/dL — ABNORMAL HIGH (ref 6–23)
CALCIUM: 9.2 mg/dL (ref 8.4–10.5)
CO2: 28 mEq/L (ref 19–32)
Chloride: 100 mEq/L (ref 96–112)
Creat: 1.71 mg/dL — ABNORMAL HIGH (ref 0.50–1.10)
GFR, EST AFRICAN AMERICAN: 34 mL/min — AB
GFR, EST NON AFRICAN AMERICAN: 30 mL/min — AB
GLUCOSE: 104 mg/dL — AB (ref 70–99)
POTASSIUM: 4.5 meq/L (ref 3.5–5.3)
SODIUM: 139 meq/L (ref 135–145)

## 2014-04-28 LAB — HEPATIC FUNCTION PANEL
ALBUMIN: 4.3 g/dL (ref 3.5–5.2)
ALT: 23 U/L (ref 0–35)
AST: 26 U/L (ref 0–37)
Alkaline Phosphatase: 106 U/L (ref 39–117)
BILIRUBIN DIRECT: 0.2 mg/dL (ref 0.0–0.3)
Indirect Bilirubin: 0.7 mg/dL (ref 0.2–1.2)
Total Bilirubin: 0.9 mg/dL (ref 0.2–1.2)
Total Protein: 6.7 g/dL (ref 6.0–8.3)

## 2014-04-28 LAB — TSH: TSH: 1.384 u[IU]/mL (ref 0.350–4.500)

## 2014-04-28 LAB — HEMOGLOBIN A1C
Hgb A1c MFr Bld: 6.1 % — ABNORMAL HIGH (ref <5.7)
MEAN PLASMA GLUCOSE: 128 mg/dL — AB (ref <117)

## 2014-04-28 LAB — MAGNESIUM: Magnesium: 2 mg/dL (ref 1.5–2.5)

## 2014-05-03 ENCOUNTER — Other Ambulatory Visit: Payer: Self-pay | Admitting: Internal Medicine

## 2014-05-07 DIAGNOSIS — H26499 Other secondary cataract, unspecified eye: Secondary | ICD-10-CM | POA: Diagnosis not present

## 2014-05-14 ENCOUNTER — Other Ambulatory Visit: Payer: Self-pay | Admitting: Internal Medicine

## 2014-05-14 DIAGNOSIS — H26499 Other secondary cataract, unspecified eye: Secondary | ICD-10-CM | POA: Diagnosis not present

## 2014-06-16 ENCOUNTER — Ambulatory Visit (INDEPENDENT_AMBULATORY_CARE_PROVIDER_SITE_OTHER): Payer: Medicare Other | Admitting: Emergency Medicine

## 2014-06-16 ENCOUNTER — Ambulatory Visit
Admission: RE | Admit: 2014-06-16 | Discharge: 2014-06-16 | Disposition: A | Discharge status: Home / Self Care | Payer: Medicare Other | Source: Ambulatory Visit | Attending: Emergency Medicine | Admitting: Emergency Medicine

## 2014-06-16 ENCOUNTER — Encounter: Payer: Self-pay | Admitting: Emergency Medicine

## 2014-06-16 VITALS — BP 138/76 | HR 62 | Temp 98.0°F | Resp 18 | Ht 64.5 in | Wt 183.0 lb

## 2014-06-16 DIAGNOSIS — R0602 Shortness of breath: Secondary | ICD-10-CM

## 2014-06-16 DIAGNOSIS — R059 Cough, unspecified: Secondary | ICD-10-CM

## 2014-06-16 DIAGNOSIS — J40 Bronchitis, not specified as acute or chronic: Secondary | ICD-10-CM | POA: Diagnosis not present

## 2014-06-16 DIAGNOSIS — J309 Allergic rhinitis, unspecified: Secondary | ICD-10-CM | POA: Diagnosis not present

## 2014-06-16 DIAGNOSIS — R05 Cough: Secondary | ICD-10-CM

## 2014-06-16 DIAGNOSIS — M109 Gout, unspecified: Secondary | ICD-10-CM | POA: Diagnosis not present

## 2014-06-16 LAB — URIC ACID: URIC ACID, SERUM: 8.9 mg/dL — AB (ref 2.4–7.0)

## 2014-06-16 LAB — CBC WITH DIFFERENTIAL/PLATELET
Basophils Absolute: 0 10*3/uL (ref 0.0–0.1)
Basophils Relative: 0 % (ref 0–1)
EOS ABS: 0.6 10*3/uL (ref 0.0–0.7)
Eosinophils Relative: 7 % — ABNORMAL HIGH (ref 0–5)
HCT: 35.4 % — ABNORMAL LOW (ref 36.0–46.0)
Hemoglobin: 12.4 g/dL (ref 12.0–15.0)
LYMPHS ABS: 1.3 10*3/uL (ref 0.7–4.0)
Lymphocytes Relative: 15 % (ref 12–46)
MCH: 29.7 pg (ref 26.0–34.0)
MCHC: 35 g/dL (ref 30.0–36.0)
MCV: 84.7 fL (ref 78.0–100.0)
Monocytes Absolute: 0.8 10*3/uL (ref 0.1–1.0)
Monocytes Relative: 9 % (ref 3–12)
NEUTROS PCT: 69 % (ref 43–77)
Neutro Abs: 6 10*3/uL (ref 1.7–7.7)
Platelets: 308 10*3/uL (ref 150–400)
RBC: 4.18 MIL/uL (ref 3.87–5.11)
RDW: 14.3 % (ref 11.5–15.5)
WBC: 8.7 10*3/uL (ref 4.0–10.5)

## 2014-06-16 LAB — BASIC METABOLIC PANEL WITH GFR
BUN: 33 mg/dL — AB (ref 6–23)
CHLORIDE: 99 meq/L (ref 96–112)
CO2: 29 mEq/L (ref 19–32)
Calcium: 9.2 mg/dL (ref 8.4–10.5)
Creat: 1.68 mg/dL — ABNORMAL HIGH (ref 0.50–1.10)
GFR, EST AFRICAN AMERICAN: 35 mL/min — AB
GFR, EST NON AFRICAN AMERICAN: 30 mL/min — AB
Glucose, Bld: 116 mg/dL — ABNORMAL HIGH (ref 70–99)
POTASSIUM: 4.4 meq/L (ref 3.5–5.3)
Sodium: 137 mEq/L (ref 135–145)

## 2014-06-16 MED ORDER — FUROSEMIDE 40 MG PO TABS: ORAL_TABLET | ORAL | Status: DC

## 2014-06-16 MED ORDER — ALBUTEROL SULFATE HFA 108 (90 BASE) MCG/ACT IN AERS: 2.0000 | INHALATION_SPRAY | Freq: Four times a day (QID) | RESPIRATORY_TRACT | Status: DC | PRN

## 2014-06-16 MED ORDER — PREDNISONE 10 MG PO TABS: ORAL_TABLET | ORAL | Status: DC

## 2014-06-16 MED ORDER — BENZONATATE 100 MG PO CAPS: 100.0000 mg | ORAL_CAPSULE | Freq: Three times a day (TID) | ORAL | Status: DC | PRN

## 2014-06-16 NOTE — Progress Notes (Signed)
Subjective:    Patient ID: Kelly Cantu, female    DOB: 01-07-1941, 73 y.o.   MRN: [id number]  HPI Comments: 73 yo WF with increased chest congestion and wheezing over last couple of days. She has been more tired over last couple days. She denies fever or color to production. She is feeling mild SOB with cough. She has one or two cigarettes over last couple of days with increased stress.   She has decreased Allopurinol to 2-3 times a week without a flare for over 2-3 months.   Cough     Medication List       This list is accurate as of: 06/16/14 11:13 AM.  Always use your most recent med list.               allopurinol 300 MG tablet  Commonly known as:  ZYLOPRIM  Take 300 mg by mouth daily.     atenolol 25 MG tablet  Commonly known as:  TENORMIN  TAKE 1 TABLET TWICE A DAY FOR BLOOD PRESSURE     atorvastatin 20 MG tablet  Commonly known as:  LIPITOR  TAKE 1 TABLET DAILY FOR CHOLESTEROL     BABY ASPIRIN PO  Take 81 mg by mouth daily.     FISH OIL PO  Take by mouth daily.     furosemide 40 MG tablet  Commonly known as:  LASIX  TAKE 1 TABLET DAILY FOR BLOOD PRESSURE AND FLUID     MULTIVITAMIN PO  Take by mouth daily.     NEXIUM 40 MG capsule  Generic drug:  esomeprazole  TAKE 1 CAPSULE DAILY FOR ACID INDIGESTION     VITAMIN D PO  Take by mouth daily.       Allergies  Allergen Reactions  . Ace Inhibitors   . Feldene [Piroxicam] Rash   Past Medical History  Diagnosis Date  . Hyperlipidemia   . Hypertension   . Prediabetes   . GERD (gastroesophageal reflux disease)   . DJD (degenerative joint disease)   . DDD (degenerative disc disease), lumbar   . IBS (irritable bowel syndrome)       Review of Systems  Respiratory: Positive for cough.   All other systems reviewed and are negative. BP 138/76  Pulse 62  Temp(Src) 98 F (36.7 C) (Temporal)  Resp 18  Ht 5' 4.5" (1.638 m)  Wt 183 lb (83.008 kg)  BMI 30.94 kg/m2  SpO2 96%      Objective:   Physical Exam  Nursing note and vitals reviewed. Constitutional: She is oriented to person, place, and time. She appears well-developed and well-nourished.  HENT:  Head: Normocephalic and atraumatic.  Right Ear: External ear normal.  Left Ear: External ear normal.  Nose: Nose normal.  Mouth/Throat: No oropharyngeal exudate.  Cloudy TM's bilaterally   Eyes: Conjunctivae and EOM are normal.  Neck: Normal range of motion.  Cardiovascular: Normal rate, regular rhythm, normal heart sounds and intact distal pulses.   Pulmonary/Chest: Effort normal. She has wheezes.  Musculoskeletal: Normal range of motion. She exhibits no edema and no tenderness.  Lymphadenopathy:    She has no cervical adenopathy.  Neurological: She is alert and oriented to person, place, and time.  Skin: Skin is warm and dry.  Psychiatric: She has a normal mood and affect. Judgment normal.          Assessment & Plan:  1. Cough/ SOB/ WHeezing- Check labs/ CXR. PRED DP 10 AD, Albuterol Inhaler and Tessalon perles AD.  w/c if SX increase or ER.   2. Allergic rhinitis- Zyrtec/ FLONASE both AD OTC, increase H2o, allergy hygiene explained.   3. Gout- recheck with decreased Allopurinol dosing.

## 2014-06-16 NOTE — Patient Instructions (Signed)
Bronchitis Zyrtec or claritin and Flonase nasal spray OTC Bronchitis is swelling (inflammation) of the air tubes leading to your lungs (bronchi). This causes mucus and a cough. If the swelling gets bad, you may have trouble breathing. HOME CARE   Rest.  Drink enough fluids to keep your pee (urine) clear or pale yellow (unless you have a condition where you have to watch how much you drink).  Only take medicine as told by your doctor. If you were given antibiotic medicines, finish them even if you start to feel better.  Avoid smoke, irritating chemicals, and strong smells. These make the problem worse. Quit smoking if you smoke. This helps your lungs heal faster.  Use a cool mist humidifier. Change the water in the humidifier every day. You can also sit in the bathroom with hot shower running for 5-10 minutes. Keep the door closed.  See your health care provider as told.  Wash your hands often. GET HELP IF: Your problems do not get better after 1 week. GET HELP RIGHT AWAY IF:   Your fever gets worse.  You have chills.  Your chest hurts.  Your problems breathing get worse.  You have blood in your mucus.  You pass out (faint).  You feel light-headed.  You have a bad headache.  You throw up (vomit) again and again. MAKE SURE YOU:  Understand these instructions.  Will watch your condition.  Will get help right away if you are not doing well or get worse. Document Released: 05/07/2008 Document Revised: 11/24/2013 Document Reviewed: 07/14/2013 [facility] Patient Information 2015 [place], [place]. This information is not intended to replace advice given to you by your health care provider. Make sure you discuss any questions you have with your health care provider.

## 2014-06-17 LAB — BRAIN NATRIURETIC PEPTIDE: BRAIN NATRIURETIC PEPTIDE: 58.4 pg/mL (ref 0.0–100.0)

## 2014-07-22 ENCOUNTER — Ambulatory Visit (INDEPENDENT_AMBULATORY_CARE_PROVIDER_SITE_OTHER): Payer: Medicare Other | Admitting: Physician Assistant

## 2014-07-22 ENCOUNTER — Encounter: Payer: Self-pay | Admitting: Physician Assistant

## 2014-07-22 VITALS — BP 136/72 | HR 64 | Temp 98.0°F | Resp 18 | Ht 64.5 in | Wt 186.0 lb

## 2014-07-22 DIAGNOSIS — N183 Chronic kidney disease, stage 3 unspecified: Secondary | ICD-10-CM | POA: Diagnosis not present

## 2014-07-22 DIAGNOSIS — M109 Gout, unspecified: Secondary | ICD-10-CM | POA: Diagnosis not present

## 2014-07-22 DIAGNOSIS — E785 Hyperlipidemia, unspecified: Secondary | ICD-10-CM

## 2014-07-22 DIAGNOSIS — I10 Essential (primary) hypertension: Secondary | ICD-10-CM | POA: Diagnosis not present

## 2014-07-22 DIAGNOSIS — Z79899 Other long term (current) drug therapy: Secondary | ICD-10-CM

## 2014-07-22 DIAGNOSIS — N184 Chronic kidney disease, stage 4 (severe): Secondary | ICD-10-CM | POA: Insufficient documentation

## 2014-07-22 LAB — BASIC METABOLIC PANEL WITH GFR
BUN: 30 mg/dL — AB (ref 6–23)
CHLORIDE: 101 meq/L (ref 96–112)
CO2: 27 meq/L (ref 19–32)
Calcium: 9.5 mg/dL (ref 8.4–10.5)
Creat: 1.64 mg/dL — ABNORMAL HIGH (ref 0.50–1.10)
GFR, Est African American: 36 mL/min — ABNORMAL LOW
GFR, Est Non African American: 31 mL/min — ABNORMAL LOW
GLUCOSE: 108 mg/dL — AB (ref 70–99)
POTASSIUM: 4 meq/L (ref 3.5–5.3)
Sodium: 138 mEq/L (ref 135–145)

## 2014-07-22 LAB — MAGNESIUM: Magnesium: 1.8 mg/dL (ref 1.5–2.5)

## 2014-07-22 LAB — URIC ACID: Uric Acid, Serum: 7.7 mg/dL — ABNORMAL HIGH (ref 2.4–7.0)

## 2014-07-22 MED ORDER — ATENOLOL 25 MG PO TABS: ORAL_TABLET | ORAL | Status: DC

## 2014-07-22 MED ORDER — ATORVASTATIN CALCIUM 20 MG PO TABS: ORAL_TABLET | ORAL | Status: DC

## 2014-07-22 MED ORDER — ESOMEPRAZOLE MAGNESIUM 40 MG PO CPDR: DELAYED_RELEASE_CAPSULE | ORAL | Status: DC

## 2014-07-22 NOTE — Progress Notes (Signed)
HPI 73 y.o.female presents for 1 month follow up. Her uric acid was elevated last visit so her Allopurinol was increased to 1/2 4 days a week from 3. She reports no flares. She also had a bronchitis which has resolved. 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   Past Medical History  Diagnosis Date  . Hyperlipidemia   . Hypertension   . Prediabetes   . GERD (gastroesophageal reflux disease)   . DJD (degenerative joint disease)   . DDD (degenerative disc disease), lumbar   . IBS (irritable bowel syndrome)      Allergies  Allergen Reactions  . Ace Inhibitors   . Feldene [Piroxicam] Rash      Current Outpatient Prescriptions on File Prior to Visit  Medication Sig Dispense Refill  . albuterol (PROVENTIL HFA;VENTOLIN HFA) 108 (90 BASE) MCG/ACT inhaler Inhale 2 puffs into the lungs every 6 (six) hours as needed for wheezing or shortness of breath.  1 Inhaler  2  . allopurinol (ZYLOPRIM) 300 MG tablet Take 300 mg by mouth daily.      Marland Kitchen atenolol (TENORMIN) 25 MG tablet TAKE 1 TABLET TWICE A DAY FOR BLOOD PRESSURE  180 tablet  1  . atorvastatin (LIPITOR) 20 MG tablet TAKE 1 TABLET DAILY FOR CHOLESTEROL  30 tablet  0  . BABY ASPIRIN PO Take 81 mg by mouth daily.      . Cholecalciferol (VITAMIN D PO) Take by mouth daily.      . furosemide (LASIX) 40 MG tablet TAKE 1 TABLET DAILY FOR BLOOD PRESSURE AND FLUID  90 tablet  1  . Multiple Vitamins-Minerals (MULTIVITAMIN PO) Take by mouth daily.      Marland Kitchen NEXIUM 40 MG capsule TAKE 1 CAPSULE DAILY FOR ACID INDIGESTION  90 capsule  49  . Omega-3 Fatty Acids (FISH OIL PO) Take by mouth daily.       No current facility-administered medications on file prior to visit.    ROS: all negative expect above.   Physical: Filed Weights   07/22/14 1417  Weight: 186 lb (84.369 kg)   BP 136/72  Pulse 64  Temp(Src) 98 F (36.7 C) (Temporal)  Resp 18  Ht 5' 4.5" (1.638 m)  Wt 186 lb (84.369 kg)  BMI 31.45  kg/m2 General Appearance: Well nourished, in no apparent distress. Eyes: PERRLA, EOMs. Sinuses: No Frontal/maxillary tenderness ENT/Mouth: Ext aud canals clear, normal light reflex with TMs without erythema, bulging. Post pharynx without erythema, swelling, exudate.  Respiratory: CTAB Cardio: RRR, no murmurs, rubs or gallops. Peripheral pulses brisk and equal bilaterally, without edema. No aortic or femoral bruits. Abdomen: Soft, with bowl sounds. Nontender, no guarding, rebound. Lymphatics: Non tender without lymphadenopathy.  Musculoskeletal: Full ROM all peripheral extremities, 5/5 strength, and normal gait. Skin: Warm, dry without rashes, lesions, ecchymosis.  Neuro: Cranial nerves intact, reflexes equal bilaterally. Normal muscle tone, no cerebellar symptoms. Sensation intact.  Pysch: Awake and oriented X 3, normal affect, Insight and Judgment appropriate.   Assessment and Plan: 1. CKD (chronic kidney disease) stage 3, GFR 30-59 ml/min - BASIC METABOLIC PANEL WITH GFR  2. Gouty arthropathy, unspecified - Uric acid  3. Encounter for long-term (current) use of other medications - Magnesium  4. Essential hypertension - atenolol (TENORMIN) 25 MG tablet; TAKE 1 TABLET TWICE A DAY FOR BLOOD PRESSURE  Dispense: 180 tablet; Refill: 1  5. Hyperlipidemia - atorvastatin (LIPITOR) 20 MG tablet; TAKE 1 TABLET DAILY FOR CHOLESTEROL  Dispense: 90 tablet; Refill: 0

## 2014-07-22 NOTE — Patient Instructions (Signed)

## 2014-07-26 DIAGNOSIS — D649 Anemia, unspecified: Secondary | ICD-10-CM | POA: Diagnosis not present

## 2014-07-26 DIAGNOSIS — N2581 Secondary hyperparathyroidism of renal origin: Secondary | ICD-10-CM | POA: Diagnosis not present

## 2014-07-26 DIAGNOSIS — N183 Chronic kidney disease, stage 3 unspecified: Secondary | ICD-10-CM | POA: Diagnosis not present

## 2014-07-26 DIAGNOSIS — I129 Hypertensive chronic kidney disease with stage 1 through stage 4 chronic kidney disease, or unspecified chronic kidney disease: Secondary | ICD-10-CM | POA: Diagnosis not present

## 2014-08-02 ENCOUNTER — Encounter: Payer: Self-pay | Admitting: Internal Medicine

## 2014-08-12 ENCOUNTER — Encounter: Payer: Self-pay | Admitting: Internal Medicine

## 2014-08-16 ENCOUNTER — Ambulatory Visit (AMBULATORY_SURGERY_CENTER): Payer: Self-pay | Admitting: *Deleted

## 2014-08-16 VITALS — Ht 65.0 in | Wt 187.8 lb

## 2014-08-16 DIAGNOSIS — Z1211 Encounter for screening for malignant neoplasm of colon: Secondary | ICD-10-CM

## 2014-08-16 NOTE — Progress Notes (Signed)
No home 02 use, no cpap. ewm No problems with past sedation. ewm No egg or soy allergy. ewm

## 2014-08-18 ENCOUNTER — Encounter: Payer: Self-pay | Admitting: Internal Medicine

## 2014-08-20 ENCOUNTER — Telehealth: Payer: Self-pay | Admitting: Internal Medicine

## 2014-08-20 DIAGNOSIS — Z1211 Encounter for screening for malignant neoplasm of colon: Secondary | ICD-10-CM

## 2014-08-20 MED ORDER — MOVIPREP 100 G PO SOLR: 1.0000 | Freq: Once | ORAL | Status: DC

## 2014-08-30 ENCOUNTER — Ambulatory Visit (AMBULATORY_SURGERY_CENTER): Payer: Medicare Other | Admitting: Internal Medicine

## 2014-08-30 ENCOUNTER — Encounter: Payer: Self-pay | Admitting: Internal Medicine

## 2014-08-30 VITALS — BP 123/63 | HR 70 | Temp 98.9°F | Resp 13 | Ht 65.0 in | Wt 187.0 lb

## 2014-08-30 DIAGNOSIS — I10 Essential (primary) hypertension: Secondary | ICD-10-CM | POA: Diagnosis not present

## 2014-08-30 DIAGNOSIS — Z1211 Encounter for screening for malignant neoplasm of colon: Secondary | ICD-10-CM | POA: Diagnosis not present

## 2014-08-30 MED ORDER — SODIUM CHLORIDE 0.9 % IV SOLN: 500.0000 mL | INTRAVENOUS | Status: DC

## 2014-08-30 NOTE — Patient Instructions (Addendum)
No polyps today!  You do not need another routine colonoscopy but I will be available if you need me.  I appreciate the opportunity to care for you. Gatha Mayer, MD, FACG   YOU HAD AN ENDOSCOPIC PROCEDURE TODAY AT [facility]: Refer to the procedure report that was given to you for any specific questions about what was found during the examination.  If the procedure report does not answer your questions, please call your gastroenterologist to clarify.  If you requested that your care partner not be given the details of your procedure findings, then the procedure report has been included in a sealed envelope for you to review at your convenience later.  YOU SHOULD EXPECT: Some feelings of bloating in the abdomen. Passage of more gas than usual.  Walking can help get rid of the air that was put into your GI tract during the procedure and reduce the bloating. If you had a lower endoscopy (such as a colonoscopy or flexible sigmoidoscopy) you may notice spotting of blood in your stool or on the toilet paper. If you underwent a bowel prep for your procedure, then you may not have a normal bowel movement for a few days.  DIET: Your first meal following the procedure should be a light meal and then it is ok to progress to your normal diet.  A half-sandwich or bowl of soup is an example of a good first meal.  Heavy or fried foods are harder to digest and may make you feel nauseous or bloated.  Likewise meals heavy in dairy and vegetables can cause extra gas to form and this can also increase the bloating.  Drink plenty of fluids but you should avoid alcoholic beverages for 24 hours.  ACTIVITY: Your care partner should take you home directly after the procedure.  You should plan to take it easy, moving slowly for the rest of the day.  You can resume normal activity the day after the procedure however you should NOT DRIVE or use heavy machinery for 24 hours (because of the sedation medicines  used during the test).    SYMPTOMS TO REPORT IMMEDIATELY: A gastroenterologist can be reached at any hour.  During normal business hours, 8:30 AM to 5:00 PM Monday through Friday, call [phone].  After hours and on weekends, please call the GI answering service at [phone] who will take a message and have the physician on call contact you.   Following lower endoscopy (colonoscopy or flexible sigmoidoscopy):  Excessive amounts of blood in the stool  Significant tenderness or worsening of abdominal pains  Swelling of the abdomen that is new, acute  Fever of 100F or higher   FOLLOW UP: If any biopsies were taken you will be contacted by phone or by letter within the next 1-3 weeks.  Call your gastroenterologist if you have not heard about the biopsies in 3 weeks.  Our staff will call the home number listed on your records the next business day following your procedure to check on you and address any questions or concerns that you may have at that time regarding the information given to you following your procedure. This is a courtesy call and so if there is no answer at the home number and we have not heard from you through the emergency physician on call, we will assume that you have returned to your regular daily activities without incident.  SIGNATURES/CONFIDENTIALITY: You and/or your care partner have signed paperwork which will be  entered into your electronic medical record.  These signatures attest to the fact that that the information above on your After Visit Summary has been reviewed and is understood.  Full responsibility of the confidentiality of this discharge information lies with you and/or your care-partner.   Resume medications.

## 2014-08-30 NOTE — Progress Notes (Signed)
Report to PACU, RN, vss, BBS= Clear.  

## 2014-08-30 NOTE — Op Note (Signed)
[place]  [address] [place], [id number]   COLONOSCOPY PROCEDURE REPORT  PATIENT: Kelly Cantu, Kelly Cantu  MR#: [id number] BIRTHDATE: 1941/04/13 , 62  yrs. old GENDER: female ENDOSCOPIST: Gatha Mayer, MD, [facility] PROCEDURE DATE:  08/30/2014 PROCEDURE:   Colonoscopy, screening First Screening Colonoscopy - Avg.  risk and is 50 yrs.  old or older - No.  Prior Negative Screening - Now for repeat screening. 10 or more years since last screening ASA CLASS:   Class II INDICATIONS:average risk for colorectal cancer and last colonoscopy completed  10 years ago. MEDICATIONS: Propofol 200 mg IV and Monitored anesthesia care  DESCRIPTION OF PROCEDURE:   After the risks benefits and alternatives of the procedure were thoroughly explained, informed consent was obtained.  The digital rectal exam revealed no abnormalities of the rectum.   The LB [id number]  endoscope was introduced through the anus and advanced to the cecum, which was identified by both the appendix and ileocecal valve. No adverse events experienced.   The quality of the prep was excellent, using MoviPrep  The instrument was then slowly withdrawn as the colon was fully examined.  COLON FINDINGS: A normal appearing cecum, ileocecal valve, and appendiceal orifice were identified.  The ascending, transverse, descending, sigmoid colon, and rectum appeared unremarkable. Right colon retroflexion included.  Retroflexed views revealed no abnormalities. The time to cecum=2 minutes 51 seconds.  Withdrawal time=9 minutes 01 seconds.  The scope was withdrawn and the procedure completed. COMPLICATIONS: There were no complications.  ENDOSCOPIC IMPRESSION: 1.   Normal colonoscopy - excellent prep 2.   Right colon retroflexion included  RECOMMENDATIONS: Routine repeat colonoscopy screening not necessary.  See me/GI as needed.  eSigned:  Gatha Mayer, MD, [facility] 08/30/2014 8:34 AM   cc: Unk Pinto, MD   and The  Patient

## 2014-08-31 ENCOUNTER — Telehealth: Payer: Self-pay | Admitting: *Deleted

## 2014-08-31 NOTE — Telephone Encounter (Signed)
  Follow up Call-  Call back number 08/30/2014  Post procedure Call Back phone  # [phone]  Permission to leave phone message Yes     Patient questions:  Do you have a fever, pain , or abdominal swelling? No. Pain Score  0 *  Have you tolerated food without any problems? Yes.    Have you been able to return to your normal activities? Yes.    Do you have any questions about your discharge instructions: Diet   No. Medications  No. Follow up visit  No.  Do you have questions or concerns about your Care? No.  Actions: * If pain score is 4 or above: No action needed, pain <4.

## 2014-09-21 NOTE — Telephone Encounter (Signed)
Rx Approved per Encounter

## 2014-09-29 ENCOUNTER — Ambulatory Visit (INDEPENDENT_AMBULATORY_CARE_PROVIDER_SITE_OTHER): Payer: Medicare Other | Admitting: Internal Medicine

## 2014-09-29 ENCOUNTER — Encounter: Payer: Self-pay | Admitting: Internal Medicine

## 2014-09-29 VITALS — BP 140/82 | HR 72 | Temp 98.1°F | Resp 16 | Ht 64.0 in | Wt 185.4 lb

## 2014-09-29 DIAGNOSIS — Z1331 Encounter for screening for depression: Secondary | ICD-10-CM

## 2014-09-29 DIAGNOSIS — M150 Primary generalized (osteo)arthritis: Secondary | ICD-10-CM

## 2014-09-29 DIAGNOSIS — Z79899 Other long term (current) drug therapy: Secondary | ICD-10-CM | POA: Diagnosis not present

## 2014-09-29 DIAGNOSIS — E559 Vitamin D deficiency, unspecified: Secondary | ICD-10-CM | POA: Insufficient documentation

## 2014-09-29 DIAGNOSIS — R7303 Prediabetes: Secondary | ICD-10-CM

## 2014-09-29 DIAGNOSIS — K219 Gastro-esophageal reflux disease without esophagitis: Secondary | ICD-10-CM

## 2014-09-29 DIAGNOSIS — R7309 Other abnormal glucose: Secondary | ICD-10-CM | POA: Diagnosis not present

## 2014-09-29 DIAGNOSIS — Z0001 Encounter for general adult medical examination with abnormal findings: Secondary | ICD-10-CM

## 2014-09-29 DIAGNOSIS — R6889 Other general symptoms and signs: Secondary | ICD-10-CM

## 2014-09-29 DIAGNOSIS — Z9181 History of falling: Secondary | ICD-10-CM

## 2014-09-29 DIAGNOSIS — M159 Polyosteoarthritis, unspecified: Secondary | ICD-10-CM

## 2014-09-29 DIAGNOSIS — M5136 Other intervertebral disc degeneration, lumbar region: Secondary | ICD-10-CM | POA: Diagnosis not present

## 2014-09-29 DIAGNOSIS — I10 Essential (primary) hypertension: Secondary | ICD-10-CM | POA: Diagnosis not present

## 2014-09-29 DIAGNOSIS — Z1212 Encounter for screening for malignant neoplasm of rectum: Secondary | ICD-10-CM

## 2014-09-29 DIAGNOSIS — Z23 Encounter for immunization: Secondary | ICD-10-CM

## 2014-09-29 DIAGNOSIS — E785 Hyperlipidemia, unspecified: Secondary | ICD-10-CM

## 2014-09-29 LAB — CBC WITH DIFFERENTIAL/PLATELET
BASOS ABS: 0 10*3/uL (ref 0.0–0.1)
Basophils Relative: 0 % (ref 0–1)
Eosinophils Absolute: 0.1 10*3/uL (ref 0.0–0.7)
Eosinophils Relative: 1 % (ref 0–5)
HEMATOCRIT: 35.5 % — AB (ref 36.0–46.0)
Hemoglobin: 12.1 g/dL (ref 12.0–15.0)
LYMPHS PCT: 20 % (ref 12–46)
Lymphs Abs: 1.6 10*3/uL (ref 0.7–4.0)
MCH: 30 pg (ref 26.0–34.0)
MCHC: 34.1 g/dL (ref 30.0–36.0)
MCV: 88.1 fL (ref 78.0–100.0)
Monocytes Absolute: 0.8 10*3/uL (ref 0.1–1.0)
Monocytes Relative: 10 % (ref 3–12)
Neutro Abs: 5.4 10*3/uL (ref 1.7–7.7)
Neutrophils Relative %: 69 % (ref 43–77)
PLATELETS: 305 10*3/uL (ref 150–400)
RBC: 4.03 MIL/uL (ref 3.87–5.11)
RDW: 14.2 % (ref 11.5–15.5)
WBC: 7.8 10*3/uL (ref 4.0–10.5)

## 2014-09-29 MED ORDER — ESOMEPRAZOLE MAGNESIUM 40 MG PO CPDR: DELAYED_RELEASE_CAPSULE | ORAL | Status: DC

## 2014-09-29 MED ORDER — ATORVASTATIN CALCIUM 80 MG PO TABS: ORAL_TABLET | ORAL | Status: DC

## 2014-09-29 MED ORDER — ATENOLOL 50 MG PO TABS: ORAL_TABLET | ORAL | Status: DC

## 2014-09-29 NOTE — Progress Notes (Signed)
Patient ID: Kelly Cantu, female   DOB: Oct 07, 1941, 73 y.o.   MRN: [id number]  Annual Screening Comprehensive Examination  This very nice 73 y.o.[name] presents for complete physical.  Patient has been followed for HTN, CKD3,  Prediabetes, Hyperlipidemia, and Vitamin D Deficiency. Patient was hospitalized in Sept 2012 with Acute on CKD requiring interim hemodialysis precipitated by UTI, fulminant diarrhea and severe dehydration. Patient's kidney functions have remained stable as she is followed by Dr Deterding    HTN predates since 22. Patient's BP has been controlled at home and patient denies any cardiac symptoms as chest pain, palpitations, shortness of breath, dizziness or ankle swelling. Today's BP: 140/82 mmHg    Patient's hyperlipidemia is controlled with diet and medications. Patient denies myalgias or other medication SE's. Last lipids were at goal - Total Cholesterol 156; HDL 47; LDL 78; Trig 153 on 04/27/2014.   Patient has prediabetes with A1c's of 5.9% and 6.0% in 2014 and patient denies reactive hypoglycemic symptoms, visual blurring, diabetic polys, or paresthesias. Last A1c was  6.1% 04/27/2014   Finally, patient has history of Vitamin D Deficiency (34 in 2008) and last Vitamin D was 34 in Oct 2014.  Medication Sig  . allopurinol ( 300 MG tablet Take 300 mg by mouth daily. (takes 1/2 tab 4 x week)  . atenolol  25 MG tablet TAKE 1 TAB TWICE A DAY  . atorvastatin 20 MG tablet TAKE 1 TABLET DAILY   . BABY ASPIRIN PO Take 81 mg  daily.  . Cholecalciferol (VITAMIN D PO) Take daily.  Marland Kitchen esomeprazole  40 MG capsule TAKE 1 CAP DAILY   . furosemide 40 MG tablet TAKE 1 TABLET DAILY FOR BP AND FLUID  . MultiVit-Min  Take daily.  Marland Kitchen FISH OIL  Take daily.   Allergies  Allergen Reactions  . Ace Inhibitors     Pt unsure of reaction  . Feldene [Piroxicam] Rash   Past Medical History  Diagnosis Date  . Hyperlipidemia   . Hypertension   . Prediabetes   . GERD (gastroesophageal reflux  disease)   . DJD (degenerative joint disease)   . DDD (degenerative disc disease), lumbar   . IBS (irritable bowel syndrome)   . Cataract   . Chronic kidney disease    Health Maintenance  Topic Date Due  . Influenza Vaccine  07/03/2014  . Tetanus/tdap  09/02/2014  . Mammogram  10/10/2014  . Colonoscopy  08/30/2024  . Pneumococcal Polysaccharide Vaccine Age 40 And Over  Completed  . Zostavax  Completed   Immunization History  Administered Date(s) Administered  . DT 09/29/2014  . Influenza Split 09/28/2013  . Pneumococcal Polysaccharide-23 09/23/2012  . Td 09/02/2004  . Zoster 10/13/2013   Past Surgical History  Procedure Laterality Date  . Abdominal hysterectomy    . Cataract extraction    . Colonoscopy  x 2    w/Dr Carlean Purl   Family History  Problem Relation Age of Onset  . Cancer Mother     liver  . Liver cancer Mother   . Heart disease Father   . Heart attack Father   . Hypertension Sister   . Cancer Sister     kidney  . Colon cancer Neg Hx    History  Substance Use Topics  . Smoking status: Former Smoker    Quit date: 06/16/2004  . Smokeless tobacco: Never Used  . Alcohol Use: No   ROS Constitutional: Denies fever, chills, weight loss/gain, headaches, insomnia, fatigue, night sweats, and change in  appetite. Eyes: Denies redness, blurred vision, diplopia, discharge, itchy, watery eyes.  ENT: Denies discharge, congestion, post nasal drip, epistaxis, sore throat, earache, hearing loss, dental pain, Tinnitus, Vertigo, Sinus pain, snoring.  Cardio: Denies chest pain, palpitations, irregular heartbeat, syncope, dyspnea, diaphoresis, orthopnea, PND, claudication, edema Respiratory: denies cough, dyspnea, DOE, pleurisy, hoarseness, laryngitis, wheezing.  Gastrointestinal: Denies dysphagia, heartburn, reflux, water brash, pain, cramps, nausea, vomiting, bloating, diarrhea, constipation, hematemesis, melena, hematochezia, jaundice, hemorrhoids Genitourinary: Denies  dysuria, frequency, urgency, nocturia, hesitancy, discharge, hematuria, flank pain Breast: Breast lumps, nipple discharge, bleeding.  Musculoskeletal: Denies arthralgia, myalgia, stiffness, Jt. Swelling, pain, limp, and strain/sprain. Denies falls. Skin: Denies puritis, rash, hives, warts, acne, eczema, changing in skin lesion Neuro: No weakness, tremor, incoordination, spasms, paresthesia, pain Psychiatric: Denies confusion, memory loss, sensory loss. Denies Depression. Endocrine: Denies change in weight, skin, hair change, nocturia, and paresthesia, diabetic polys, visual blurring, hyper / hypo glycemic episodes.  Heme/Lymph: No excessive bleeding, bruising, enlarged lymph nodes.  Physical Exam  BP 140/82  Pulse 72  Temp(Src) 98.1 F (36.7 C)  Resp 16  Ht 5\' 4"  (1.626 m)  Wt 185 lb 6.4 oz (84.097 kg)  BMI 31.81 kg/m2  General Appearance: Well nourished and in no apparent distress. Eyes: PERRLA, EOMs, conjunctiva no swelling or erythema, normal fundi and vessels. Sinuses: No frontal/maxillary tenderness ENT/Mouth: EACs patent / TMs  nl. Nares clear without erythema, swelling, mucoid exudates. Oral hygiene is good. No erythema, swelling, or exudate. Tongue normal, non-obstructing. Tonsils not swollen or erythematous. Hearing normal.  Neck: Supple, thyroid normal. No bruits, nodes or JVD. Respiratory: Respiratory effort normal.  BS equal and clear bilateral without rales, rhonci, wheezing or stridor. Cardio: Heart sounds are normal with regular rate and rhythm and no murmurs, rubs or gallops. Peripheral pulses are normal and equal bilaterally without edema. No aortic or femoral bruits. Chest: symmetric with normal excursions and percussion. Breasts: Symmetric, without lumps, nipple discharge, retractions, or fibrocystic changes.  Abdomen: Flat, soft, with bowl sounds. Nontender, no guarding, rebound, hernias, masses, or organomegaly.  Lymphatics: Non tender without lymphadenopathy.   Musculoskeletal: Full ROM all peripheral extremities, joint stability, 5/5 strength, and normal gait. Skin: Warm and dry without rashes, lesions, cyanosis, clubbing or  ecchymosis.  Neuro: Cranial nerves intact, reflexes equal bilaterally. Normal muscle tone, no cerebellar symptoms. Sensation intact.  Pysch: Awake and oriented X 3, normal affect, Insight and Judgment appropriate.   Assessment and Plan  1. Annual Screening Examination 2. Hypertension  3. Hyperlipidemia 4. Pre Diabetes 5. Vitamin D Deficiency 6. GERD 7. DJD/DDD 8. IBS 9. CKD3    Continue prudent diet as discussed, weight control, BP monitoring, regular exercise, and medications. Discussed med's effects and SE's. Screening labs and tests as requested with regular follow-up as recommended.

## 2014-09-29 NOTE — Patient Instructions (Signed)
 Recommend the book "The END of DIETING" by Dr Joel Furman   and the book "The END of DIABETES " by Dr Joel Fuhrman  At [employer] - get book & Audio CD's      Being diabetic has a  300% increased risk for heart attack, stroke, cancer, and alzheimer- type vascular dementia. It is very important that you work harder with diet by avoiding all foods that are white except chicken & fish. Avoid white rice (brown & wild rice is OK), white potatoes (sweetpotatoes in moderation is OK), White bread or wheat bread or anything made out of white flour like bagels, donuts, rolls, buns, biscuits, cakes, pastries, cookies, pizza crust, and pasta (made from white flour & egg whites) - vegetarian pasta or spinach or wheat pasta is OK. Multigrain breads like Arnold's or [employer], or multigrain sandwich thins or flatbreads.  Diet, exercise and weight loss can reverse and cure diabetes in the early stages.  Diet, exercise and weight loss is very important in the control and prevention of complications of diabetes which affects every system in your body, ie. Brain - dementia/stroke, eyes - glaucoma/blindness, heart - heart attack/heart failure, kidneys - dialysis, stomach - gastric paralysis, intestines - malabsorption, nerves - severe painful neuritis, circulation - gangrene & loss of a leg(s), and finally cancer and Alzheimers.    I recommend avoid fried & greasy foods,  sweets/candy, white rice (brown or wild rice or Quinoa is OK), white potatoes (sweet potatoes are OK) - anything made from white flour - bagels, doughnuts, rolls, buns, biscuits,white and wheat breads, pizza crust and traditional pasta made of white flour & egg white(vegetarian pasta or spinach or wheat pasta is OK).  Multi-grain bread is OK - like multi-grain flat bread or sandwich thins. Avoid alcohol in excess. Exercise is also important.    Eat all the vegetables you want - avoid meat, especially red meat and dairy - especially cheese.  Cheese  is the most concentrated form of trans-fats which is the worst thing to clog up our arteries. Veggie cheese is OK which can be found in the fresh produce section at [employer] or [employer] or [employer]  Preventive Care for Adults A healthy lifestyle and preventive care can promote health and wellness. Preventive health guidelines for women include the following key practices.  A routine yearly physical is a good way to check with your health care provider about your health and preventive screening. It is a chance to share any concerns and updates on your health and to receive a thorough exam.  Visit your dentist for a routine exam and preventive care every 6 months. Brush your teeth twice a day and floss once a day. Good oral hygiene prevents tooth decay and gum disease.  The frequency of eye exams is based on your age, health, family medical history, use of contact lenses, and other factors. Follow your health care provider's recommendations for frequency of eye exams.  Eat a healthy diet. Foods like vegetables, fruits, whole grains, low-fat dairy products, and lean protein foods contain the nutrients you need without too many calories. Decrease your intake of foods high in solid fats, added sugars, and salt. Eat the right amount of calories for you.Get information about a proper diet from your health care provider, if necessary.  Regular physical exercise is one of the most important things you can do for your health. Most adults should get at least 150 minutes of moderate-intensity exercise (any activity that increases   your heart rate and causes you to sweat) each week. In addition, most adults need muscle-strengthening exercises on 2 or more days a week.  Maintain a healthy weight. The body mass index (BMI) is a screening tool to identify possible weight problems. It provides an estimate of body fat based on height and weight. Your health care provider can find your BMI and can help you  achieve or maintain a healthy weight.For adults 20 years and older:  A BMI below 18.5 is considered underweight.  A BMI of 18.5 to 24.9 is normal.  A BMI of 25 to 29.9 is considered overweight.  A BMI of 30 and above is considered obese.  Maintain normal blood lipids and cholesterol levels by exercising and minimizing your intake of saturated fat. Eat a balanced diet with plenty of fruit and vegetables. If your lipid or cholesterol levels are high, you are over 50, or you are at high risk for heart disease, you may need your cholesterol levels checked more frequently.Ongoing high lipid and cholesterol levels should be treated with medicines if diet and exercise are not working.  If you smoke, find out from your health care provider how to quit. If you do not use tobacco, do not start.  Lung cancer screening is recommended for adults aged 55-80 years who are at high risk for developing lung cancer because of a history of smoking. A yearly low-dose CT scan of the lungs is recommended for people who have at least a 30-pack-year history of smoking and are a current smoker or have quit within the past 15 years. A pack year of smoking is smoking an average of 1 pack of cigarettes a day for 1 year (for example: 1 pack a day for 30 years or 2 packs a day for 15 years). Yearly screening should continue until the smoker has stopped smoking for at least 15 years. Yearly screening should be stopped for people who develop a health problem that would prevent them from having lung cancer treatment.  Avoid use of street drugs. Do not share needles with anyone. Ask for help if you need support or instructions about stopping the use of drugs.  High blood pressure causes heart disease and increases the risk of stroke.  Ongoing high blood pressure should be treated with medicines if weight loss and exercise do not work.  If you are 55-79 years old, ask your health care provider if you should take aspirin to  prevent strokes.  Diabetes screening involves taking a blood sample to check your fasting blood sugar level. This should be done once every 3 years, after age 45, if you are within normal weight and without risk factors for diabetes. Testing should be considered at a younger age or be carried out more frequently if you are overweight and have at least 1 risk factor for diabetes.  Breast cancer screening is essential preventive care for women. You should practice "breast self-awareness." This means understanding the normal appearance and feel of your breasts and may include breast self-examination. Any changes detected, no matter how small, should be reported to a health care provider. Women in their 20s and 30s should have a clinical breast exam (CBE) by a health care provider as part of a regular health exam every 1 to 3 years. After age 40, women should have a CBE every year. Starting at age 40, women should consider having a mammogram (breast X-ray test) every year. Women who have a family history of breast cancer should   talk to their health care provider about genetic screening. Women at a high risk of breast cancer should talk to their health care providers about having an MRI and a mammogram every year.  Breast cancer gene (BRCA)-related cancer risk assessment is recommended for women who have family members with BRCA-related cancers. BRCA-related cancers include breast, ovarian, tubal, and peritoneal cancers. Having family members with these cancers may be associated with an increased risk for harmful changes (mutations) in the breast cancer genes BRCA1 and BRCA2. Results of the assessment will determine the need for genetic counseling and BRCA1 and BRCA2 testing.  Routine pelvic exams to screen for cancer are no longer recommended for nonpregnant women who are considered low risk for cancer of the pelvic organs (ovaries, uterus, and vagina) and who do not have symptoms. Ask your health care provider  if a screening pelvic exam is right for you.  If you have had past treatment for cervical cancer or a condition that could lead to cancer, you need Pap tests and screening for cancer for at least 20 years after your treatment. If Pap tests have been discontinued, your risk factors (such as having a new sexual partner) need to be reassessed to determine if screening should be resumed. Some women have medical problems that increase the chance of getting cervical cancer. In these cases, your health care provider may recommend more frequent screening and Pap tests.    Colorectal cancer can be detected and often prevented. Most routine colorectal cancer screening begins at the age of 50 years and continues through age 75 years. However, your health care provider may recommend screening at an earlier age if you have risk factors for colon cancer. On a yearly basis, your health care provider may provide home test kits to check for hidden blood in the stool. Use of a small camera at the end of a tube, to directly examine the colon (sigmoidoscopy or colonoscopy), can detect the earliest forms of colorectal cancer. Talk to your health care provider about this at age 50, when routine screening begins. Direct exam of the colon should be repeated every 5-10 years through age 75 years, unless early forms of pre-cancerous polyps or small growths are found.  Osteoporosis is a disease in which the bones lose minerals and strength with aging. This can result in serious bone fractures or breaks. The risk of osteoporosis can be identified using a bone density scan. Women ages 65 years and over and women at risk for fractures or osteoporosis should discuss screening with their health care providers. Ask your health care provider whether you should take a calcium supplement or vitamin D to reduce the rate of osteoporosis.  Menopause can be associated with physical symptoms and risks. Hormone replacement therapy is available to  decrease symptoms and risks. You should talk to your health care provider about whether hormone replacement therapy is right for you.  Use sunscreen. Apply sunscreen liberally and repeatedly throughout the day. You should seek shade when your shadow is shorter than you. Protect yourself by wearing long sleeves, pants, a wide-brimmed hat, and sunglasses year round, whenever you are outdoors.  Once a month, do a whole body skin exam, using a mirror to look at the skin on your back. Tell your health care provider of new moles, moles that have irregular borders, moles that are larger than a pencil eraser, or moles that have changed in shape or color.  Stay current with required vaccines (immunizations).  Influenza vaccine. All adults   should be immunized every year.  Tetanus, diphtheria, and acellular pertussis (Td, Tdap) vaccine. Pregnant women should receive 1 dose of Tdap vaccine during each pregnancy. The dose should be obtained regardless of the length of time since the last dose. Immunization is preferred during the 27th-36th week of gestation. An adult who has not previously received Tdap or who does not know her vaccine status should receive 1 dose of Tdap. This initial dose should be followed by tetanus and diphtheria toxoids (Td) booster doses every 10 years. Adults with an unknown or incomplete history of completing a 3-dose immunization series with Td-containing vaccines should begin or complete a primary immunization series including a Tdap dose. Adults should receive a Td booster every 10 years.    Zoster vaccine. One dose is recommended for adults aged 60 years or older unless certain conditions are present.    Pneumococcal 13-valent conjugate (PCV13) vaccine. When indicated, a person who is uncertain of her immunization history and has no record of immunization should receive the PCV13 vaccine. An adult aged 19 years or older who has certain medical conditions and has not been previously  immunized should receive 1 dose of PCV13 vaccine. This PCV13 should be followed with a dose of pneumococcal polysaccharide (PPSV23) vaccine. The PPSV23 vaccine dose should be obtained at least 8 weeks after the dose of PCV13 vaccine. An adult aged 19 years or older who has certain medical conditions and previously received 1 or more doses of PPSV23 vaccine should receive 1 dose of PCV13. The PCV13 vaccine dose should be obtained 1 or more years after the last PPSV23 vaccine dose.    Pneumococcal polysaccharide (PPSV23) vaccine. When PCV13 is also indicated, PCV13 should be obtained first. All adults aged 65 years and older should be immunized. An adult younger than age 65 years who has certain medical conditions should be immunized. Any person who resides in a nursing home or long-term care facility should be immunized. An adult smoker should be immunized. People with an immunocompromised condition and certain other conditions should receive both PCV13 and PPSV23 vaccines. People with human immunodeficiency virus (HIV) infection should be immunized as soon as possible after diagnosis. Immunization during chemotherapy or radiation therapy should be avoided. Routine use of PPSV23 vaccine is not recommended for American Indians, [place] Natives, or people younger than 65 years unless there are medical conditions that require PPSV23 vaccine. When indicated, people who have unknown immunization and have no record of immunization should receive PPSV23 vaccine. One-time revaccination 5 years after the first dose of PPSV23 is recommended for people aged 19-64 years who have chronic kidney failure, nephrotic syndrome, asplenia, or immunocompromised conditions. People who received 1-2 doses of PPSV23 before age 65 years should receive another dose of PPSV23 vaccine at age 65 years or later if at least 5 years have passed since the previous dose. Doses of PPSV23 are not needed for people immunized with PPSV23 at or after  age 65 years.   Preventive Services / Frequency  Ages 65 years and over  Blood pressure check.  Lipid and cholesterol check.  Lung cancer screening. / Every year if you are aged 55-80 years and have a 30-pack-year history of smoking and currently smoke or have quit within the past 15 years. Yearly screening is stopped once you have quit smoking for at least 15 years or develop a health problem that would prevent you from having lung cancer treatment.  Clinical breast exam.** / Every year after age 40 years.    BRCA-related cancer risk assessment.** / For women who have family members with a BRCA-related cancer (breast, ovarian, tubal, or peritoneal cancers).  Mammogram.** / Every year beginning at age 40 years and continuing for as long as you are in good health. Consult with your health care provider.  Pap test.** / Every 3 years starting at age 30 years through age 65 or 70 years with 3 consecutive normal Pap tests. Testing can be stopped between 65 and 70 years with 3 consecutive normal Pap tests and no abnormal Pap or HPV tests in the past 10 years.  Fecal occult blood test (FOBT) of stool. / Every year beginning at age 50 years and continuing until age 75 years. You may not need to do this test if you get a colonoscopy every 10 years.  Flexible sigmoidoscopy or colonoscopy.** / Every 5 years for a flexible sigmoidoscopy or every 10 years for a colonoscopy beginning at age 50 years and continuing until age 75 years.  Hepatitis C blood test.** / For all people born from 1945 through 1965 and any individual with known risks for hepatitis C.  Osteoporosis screening.** / A one-time screening for women ages 65 years and over and women at risk for fractures or osteoporosis.  Skin self-exam. / Monthly.  Influenza vaccine. / Every year.  Tetanus, diphtheria, and acellular pertussis (Tdap/Td) vaccine.** / 1 dose of Td every 10 years.  Zoster vaccine.** / 1 dose for adults aged 60 years  or older.  Pneumococcal 13-valent conjugate (PCV13) vaccine.** / Consult your health care provider.  Pneumococcal polysaccharide (PPSV23) vaccine.** / 1 dose for all adults aged 65 years and older.   

## 2014-09-30 LAB — BASIC METABOLIC PANEL WITH GFR
BUN: 29 mg/dL — ABNORMAL HIGH (ref 6–23)
CHLORIDE: 100 meq/L (ref 96–112)
CO2: 26 mEq/L (ref 19–32)
CREATININE: 1.66 mg/dL — AB (ref 0.50–1.10)
Calcium: 9.3 mg/dL (ref 8.4–10.5)
GFR, EST NON AFRICAN AMERICAN: 31 mL/min — AB
GFR, Est African American: 35 mL/min — ABNORMAL LOW
Glucose, Bld: 109 mg/dL — ABNORMAL HIGH (ref 70–99)
POTASSIUM: 4 meq/L (ref 3.5–5.3)
Sodium: 138 mEq/L (ref 135–145)

## 2014-09-30 LAB — URINALYSIS, MICROSCOPIC ONLY
Bacteria, UA: NONE SEEN
CASTS: NONE SEEN
CRYSTALS: NONE SEEN
Squamous Epithelial / LPF: NONE SEEN

## 2014-09-30 LAB — LIPID PANEL
CHOL/HDL RATIO: 3.4 ratio
Cholesterol: 169 mg/dL (ref 0–200)
HDL: 49 mg/dL (ref >39)
LDL Cholesterol: 76 mg/dL (ref 0–99)
TRIGLYCERIDES: 222 mg/dL — AB (ref <150)
VLDL: 44 mg/dL — AB (ref 0–40)

## 2014-09-30 LAB — MICROALBUMIN / CREATININE URINE RATIO
Creatinine, Urine: 40.5 mg/dL
MICROALB UR: 0.2 mg/dL (ref <2.0)
Microalb Creat Ratio: 4.9 mg/g (ref 0.0–30.0)

## 2014-09-30 LAB — INSULIN, FASTING: INSULIN FASTING, SERUM: 22.9 u[IU]/mL — AB (ref 2.0–19.6)

## 2014-09-30 LAB — HEPATIC FUNCTION PANEL
ALT: 26 U/L (ref 0–35)
AST: 33 U/L (ref 0–37)
Albumin: 4.4 g/dL (ref 3.5–5.2)
Alkaline Phosphatase: 124 U/L — ABNORMAL HIGH (ref 39–117)
BILIRUBIN DIRECT: 0.1 mg/dL (ref 0.0–0.3)
BILIRUBIN INDIRECT: 0.6 mg/dL (ref 0.2–1.2)
BILIRUBIN TOTAL: 0.7 mg/dL (ref 0.2–1.2)
Total Protein: 6.8 g/dL (ref 6.0–8.3)

## 2014-09-30 LAB — MAGNESIUM: Magnesium: 2.1 mg/dL (ref 1.5–2.5)

## 2014-09-30 LAB — HEMOGLOBIN A1C
Hgb A1c MFr Bld: 6 % — ABNORMAL HIGH (ref <5.7)
Mean Plasma Glucose: 126 mg/dL — ABNORMAL HIGH (ref <117)

## 2014-09-30 LAB — TSH: TSH: 1.537 u[IU]/mL (ref 0.350–4.500)

## 2014-09-30 LAB — VITAMIN D 25 HYDROXY (VIT D DEFICIENCY, FRACTURES): Vit D, 25-Hydroxy: 75 ng/mL (ref 30–89)

## 2014-10-01 ENCOUNTER — Other Ambulatory Visit: Payer: Self-pay | Admitting: Physician Assistant

## 2014-10-01 ENCOUNTER — Other Ambulatory Visit: Payer: Self-pay | Admitting: *Deleted

## 2014-10-01 DIAGNOSIS — E785 Hyperlipidemia, unspecified: Secondary | ICD-10-CM

## 2014-10-01 DIAGNOSIS — I10 Essential (primary) hypertension: Secondary | ICD-10-CM

## 2014-10-01 MED ORDER — ATORVASTATIN CALCIUM 80 MG PO TABS: ORAL_TABLET | ORAL | Status: DC

## 2014-10-01 MED ORDER — ATENOLOL 50 MG PO TABS: ORAL_TABLET | ORAL | Status: DC

## 2014-10-01 MED ORDER — ESOMEPRAZOLE MAGNESIUM 40 MG PO CPDR: DELAYED_RELEASE_CAPSULE | ORAL | Status: DC

## 2014-10-02 ENCOUNTER — Other Ambulatory Visit: Payer: Self-pay | Admitting: Physician Assistant

## 2014-10-03 ENCOUNTER — Other Ambulatory Visit: Payer: Self-pay | Admitting: Internal Medicine

## 2014-10-03 DIAGNOSIS — K219 Gastro-esophageal reflux disease without esophagitis: Secondary | ICD-10-CM

## 2014-10-03 MED ORDER — ESOMEPRAZOLE MAGNESIUM 40 MG PO CPDR: DELAYED_RELEASE_CAPSULE | ORAL | Status: DC

## 2014-10-04 ENCOUNTER — Other Ambulatory Visit: Payer: Self-pay | Admitting: Internal Medicine

## 2014-10-04 DIAGNOSIS — Z1231 Encounter for screening mammogram for malignant neoplasm of breast: Secondary | ICD-10-CM

## 2014-10-05 ENCOUNTER — Other Ambulatory Visit: Payer: Self-pay | Admitting: *Deleted

## 2014-10-05 DIAGNOSIS — I10 Essential (primary) hypertension: Secondary | ICD-10-CM

## 2014-10-05 MED ORDER — ATENOLOL 50 MG PO TABS: ORAL_TABLET | ORAL | Status: DC

## 2014-10-10 ENCOUNTER — Other Ambulatory Visit: Payer: Self-pay | Admitting: Emergency Medicine

## 2014-10-13 ENCOUNTER — Ambulatory Visit (INDEPENDENT_AMBULATORY_CARE_PROVIDER_SITE_OTHER): Payer: Medicare Other | Admitting: *Deleted

## 2014-10-13 DIAGNOSIS — Z23 Encounter for immunization: Secondary | ICD-10-CM | POA: Diagnosis not present

## 2014-10-18 ENCOUNTER — Ambulatory Visit (HOSPITAL_COMMUNITY)
Admission: RE | Admit: 2014-10-18 | Discharge: 2014-10-18 | Disposition: A | Discharge status: Home / Self Care | Payer: Medicare Other | Source: Ambulatory Visit | Attending: Internal Medicine | Admitting: Internal Medicine

## 2014-10-18 DIAGNOSIS — Z1231 Encounter for screening mammogram for malignant neoplasm of breast: Secondary | ICD-10-CM | POA: Diagnosis not present

## 2014-11-10 ENCOUNTER — Other Ambulatory Visit: Payer: Self-pay | Admitting: *Deleted

## 2014-11-10 MED ORDER — FUROSEMIDE 40 MG PO TABS: ORAL_TABLET | ORAL | Status: DC

## 2014-11-17 DIAGNOSIS — S0500XA Injury of conjunctiva and corneal abrasion without foreign body, unspecified eye, initial encounter: Secondary | ICD-10-CM | POA: Diagnosis not present

## 2014-12-20 DIAGNOSIS — N183 Chronic kidney disease, stage 3 (moderate): Secondary | ICD-10-CM | POA: Diagnosis not present

## 2014-12-20 DIAGNOSIS — D649 Anemia, unspecified: Secondary | ICD-10-CM | POA: Diagnosis not present

## 2014-12-20 DIAGNOSIS — I129 Hypertensive chronic kidney disease with stage 1 through stage 4 chronic kidney disease, or unspecified chronic kidney disease: Secondary | ICD-10-CM | POA: Diagnosis not present

## 2014-12-20 DIAGNOSIS — M1990 Unspecified osteoarthritis, unspecified site: Secondary | ICD-10-CM | POA: Diagnosis not present

## 2014-12-20 DIAGNOSIS — N390 Urinary tract infection, site not specified: Secondary | ICD-10-CM | POA: Diagnosis not present

## 2015-01-03 ENCOUNTER — Ambulatory Visit (INDEPENDENT_AMBULATORY_CARE_PROVIDER_SITE_OTHER): Payer: Medicare Other | Admitting: Physician Assistant

## 2015-01-03 VITALS — BP 130/78 | HR 56 | Temp 97.7°F | Resp 16 | Ht 64.0 in | Wt 185.0 lb

## 2015-01-03 DIAGNOSIS — R7309 Other abnormal glucose: Secondary | ICD-10-CM | POA: Diagnosis not present

## 2015-01-03 DIAGNOSIS — I10 Essential (primary) hypertension: Secondary | ICD-10-CM | POA: Diagnosis not present

## 2015-01-03 DIAGNOSIS — E785 Hyperlipidemia, unspecified: Secondary | ICD-10-CM | POA: Diagnosis not present

## 2015-01-03 DIAGNOSIS — N183 Chronic kidney disease, stage 3 unspecified: Secondary | ICD-10-CM

## 2015-01-03 DIAGNOSIS — Z79899 Other long term (current) drug therapy: Secondary | ICD-10-CM | POA: Diagnosis not present

## 2015-01-03 DIAGNOSIS — E559 Vitamin D deficiency, unspecified: Secondary | ICD-10-CM

## 2015-01-03 DIAGNOSIS — R7303 Prediabetes: Secondary | ICD-10-CM

## 2015-01-03 DIAGNOSIS — E669 Obesity, unspecified: Secondary | ICD-10-CM

## 2015-01-03 LAB — CBC WITH DIFFERENTIAL/PLATELET
Basophils Absolute: 0 10*3/uL (ref 0.0–0.1)
Basophils Relative: 0 % (ref 0–1)
Eosinophils Absolute: 0.1 10*3/uL (ref 0.0–0.7)
Eosinophils Relative: 1 % (ref 0–5)
HEMATOCRIT: 38.3 % (ref 36.0–46.0)
HEMOGLOBIN: 12.6 g/dL (ref 12.0–15.0)
Lymphocytes Relative: 18 % (ref 12–46)
Lymphs Abs: 1.4 10*3/uL (ref 0.7–4.0)
MCH: 29 pg (ref 26.0–34.0)
MCHC: 32.9 g/dL (ref 30.0–36.0)
MCV: 88 fL (ref 78.0–100.0)
MPV: 9.5 fL (ref 8.6–12.4)
Monocytes Absolute: 0.6 10*3/uL (ref 0.1–1.0)
Monocytes Relative: 8 % (ref 3–12)
NEUTROS ABS: 5.5 10*3/uL (ref 1.7–7.7)
Neutrophils Relative %: 73 % (ref 43–77)
Platelets: 274 10*3/uL (ref 150–400)
RBC: 4.35 MIL/uL (ref 3.87–5.11)
RDW: 14.7 % (ref 11.5–15.5)
WBC: 7.5 10*3/uL (ref 4.0–10.5)

## 2015-01-03 MED ORDER — ATORVASTATIN CALCIUM 20 MG PO TABS: ORAL_TABLET | ORAL | Status: DC

## 2015-01-03 NOTE — Progress Notes (Signed)
Assessment and Plan:  Hypertension: Continue medication, monitor blood pressure at home. Continue DASH diet.  Reminder to go to the ER if any CP, SOB, nausea, dizziness, severe HA, changes vision/speech, left arm numbness and tingling, and jaw pain. 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 Check cholesterol. Will go back to the lipitor 20mg  QD, easier for her to remember.  Pre-diabetes-Continue diet and exercise. Check A1C Vitamin D Def- check level and continue medications.  Obesity with co morbidities- long discussion about weight loss, diet, and exercise   Continue diet and meds as discussed. Further disposition pending results of labs.  HPI 74 y.o. female  presents for 3 month follow up with hypertension, hyperlipidemia, prediabetes and vitamin D.  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 Jimmy Footman, last GFR 31. .   She does workout, occ, not regularly She denies chest pain, shortness of breath, dizziness.  She is on cholesterol medication, lipitor 80 1/2 3 times a week- would like to go back to the 20mg  daily, and denies myalgias. Her cholesterol is at goal. The cholesterol last visit was:   Lab Results  Component Value Date   CHOL 169 09/29/2014   HDL 49 09/29/2014   LDLCALC 76 09/29/2014   TRIG 222* 09/29/2014   CHOLHDL 3.4 09/29/2014   She has been working on diet and exercise for prediabetes, and denies paresthesia of the feet, polydipsia, polyuria and visual disturbances. Last A1C in the office was:  Lab Results  Component Value Date   HGBA1C 6.0* 09/29/2014  Patient is on Vitamin D supplement.   Lab Results  Component Value Date   VD25OH 75 09/29/2014   BMI is Body mass index is 31.74 kg/(m^2)., she is working on diet and exercise. Wt Readings from Last 3  Encounters:  01/03/15 185 lb (83.915 kg)  09/29/14 185 lb 6.4 oz (84.097 kg)  08/30/14 187 lb (84.823 kg)    Current Medications:  Current Outpatient Prescriptions on File Prior to Visit  Medication Sig Dispense Refill  . albuterol (PROVENTIL HFA;VENTOLIN HFA) 108 (90 BASE) MCG/ACT inhaler Inhale 2 puffs into the lungs every 6 (six) hours as needed for wheezing or shortness of breath. 1 Inhaler 2  . allopurinol (ZYLOPRIM) 300 MG tablet TAKE 1 TABLET BY MOUTH EVERY DAY 90 tablet 99  . atenolol (TENORMIN) 50 MG tablet TAKE 1 TABLET Daily FOR BLOOD PRESSURE 90 tablet 99  . atorvastatin (LIPITOR) 80 MG tablet TAKE 1/2 to 1  TABLET DAILY AS DIRECTED  FOR CHOLESTEROL 90 tablet 99  . BABY ASPIRIN PO Take 81 mg by mouth daily.    . Cholecalciferol (VITAMIN D PO) Take by mouth daily.    Marland Kitchen esomeprazole (NEXIUM) 40 MG capsule TAKE 1 CAPSULE DAILY FOR ACID INDIGESTION 90 capsule 99  . furosemide (LASIX) 40 MG tablet TAKE 1 TABLET DAILY FOR BLOOD PRESSURE AND FLUID 90 tablet 4  . Multiple Vitamins-Minerals (MULTIVITAMIN PO) Take by mouth daily.    . Omega-3 Fatty Acids (FISH OIL PO) Take by mouth daily.     No current facility-administered medications on file prior to visit.   Medical History:  Past Medical History  Diagnosis Date  . Hyperlipidemia   . Hypertension   . Prediabetes   . GERD (gastroesophageal reflux disease)   .  DJD (degenerative joint disease)   . DDD (degenerative disc disease), lumbar   . IBS (irritable bowel syndrome)   . Cataract   . Chronic kidney disease    Allergies:  Allergies  Allergen Reactions  . Ace Inhibitors     Pt unsure of reaction  . Feldene [Piroxicam] Rash     Review of Systems:  Review of Systems  Constitutional: Negative.   HENT: Negative.   Eyes: Negative.   Respiratory: Positive for wheezing (occ at night, non exertional. ). Negative for cough, hemoptysis, sputum production and shortness of breath.   Cardiovascular: Positive for palpitations  (very rare, lasts seconds, no accompaniments). Negative for chest pain, orthopnea, claudication, leg swelling and PND.  Gastrointestinal: Negative.   Genitourinary: Negative.   Musculoskeletal: Negative.   Skin: Negative.   Neurological: Negative.   Endo/Heme/Allergies: Negative.   Psychiatric/Behavioral: Negative.     Family history- Review and unchanged Social history- Review and unchanged Physical Exam: BP 130/78 mmHg  Pulse 56  Temp(Src) 97.7 F (36.5 C)  Resp 16  Ht 5\' 4"  (1.626 m)  Wt 185 lb (83.915 kg)  BMI 31.74 kg/m2 Wt Readings from Last 3 Encounters:  01/03/15 185 lb (83.915 kg)  09/29/14 185 lb 6.4 oz (84.097 kg)  08/30/14 187 lb (84.823 kg)   General Appearance: Well nourished, in no apparent distress. Eyes: PERRLA, EOMs, conjunctiva no swelling or erythema Sinuses: No Frontal/maxillary tenderness ENT/Mouth: Ext aud canals clear, TMs without erythema, bulging. No erythema, swelling, or exudate on post pharynx.  Tonsils not swollen or erythematous. Hearing normal.  Neck: Supple, thyroid normal.  Respiratory: Respiratory effort normal, BS equal bilaterally without rales, rhonchi, wheezing or stridor.  Cardio: RRR with no MRGs. Brisk peripheral pulses without edema.  Abdomen: Soft, + BS,  Non tender, no guarding, rebound, hernias, masses. Lymphatics: Non tender without lymphadenopathy.  Musculoskeletal: Full ROM, 5/5 strength, Normal gait Skin: Warm, dry without rashes, lesions, ecchymosis.  Neuro: Cranial nerves intact. Normal muscle tone, no cerebellar symptoms. Psych: Awake and oriented X 3, normal affect, Insight and Judgment appropriate.    Vicie Mutters, PA-C 1:37 PM [facility]

## 2015-01-03 NOTE — Patient Instructions (Signed)
Bad carbs also include fruit juice, alcohol, and sweet tea. These are empty calories that do not signal to your brain that you are full.   Please remember the good carbs are still carbs which convert into sugar. So please measure them out no more than 1/2-1 cup of rice, oatmeal, pasta, and beans  Veggies are however free foods! Pile them on.   Not all fruit is created equal. Please see the list below, the fruit at the bottom is higher in sugars than the fruit at the top. Please avoid all dried fruits.     Before you even begin to attack a weight-loss plan, it pays to remember this: You are not fat. You have fat. Losing weight isn't about blame or shame; it's simply another achievement to accomplish. Dieting is like any other skill-you have to buckle down and work at it. As long as you act in a smart, reasonable way, you'll ultimately get where you want to be. Here are some weight loss pearls for you.  1. It's Not a Diet. It's a Lifestyle Thinking of a diet as something you're on and suffering through only for the short term doesn't work. To shed weight and keep it off, you need to make permanent changes to the way you eat. It's OK to indulge occasionally, of course, but if you cut calories temporarily and then revert to your old way of eating, you'll gain back the weight quicker than you can say yo-yo. Use it to lose it. Research shows that one of the best predictors of long-term weight loss is how many pounds you drop in the first month. For that reason, nutritionists often suggest being stricter for the first two weeks of your new eating strategy to build momentum. Cut out added sugar and alcohol and avoid unrefined carbs. After that, figure out how you can reincorporate them in a way that's healthy and maintainable.  2. There's a Right Way to Exercise Working out burns calories and fat and boosts your metabolism by building muscle. But those trying to lose weight are notorious for  overestimating the number of calories they burn and underestimating the amount they take in. Unfortunately, your system is biologically programmed to hold on to extra pounds and that means when you start exercising, your body senses the deficit and ramps up its hunger signals. If you're not diligent, you'll eat everything you burn and then some. Use it to lose it. Cardio gets all the exercise glory, but strength and interval training are the real heroes. They help you build lean muscle, which in turn increases your metabolism and calorie-burning ability 3. Don't Overreact to Mild Hunger Some people have a hard time losing weight because of hunger anxiety. To them, being hungry is bad-something to be avoided at all costs-so they carry snacks with them and eat when they don't need to. Others eat because they're stressed out or bored. While you never want to get to the point of being ravenous (that's when bingeing is likely to happen), a hunger pang, a craving, or the fact that it's 3:00 p.m. should not send you racing for the vending machine or obsessing about the energy bar in your purse. Ideally, you should put off eating until your stomach is growling and it's difficult to concentrate.  Use it to lose it. When you feel the urge to eat, use the HALT method. Ask yourself, Am I really hungry? Or am I angry or anxious, lonely or bored, or tired?  If you're still not certain, try the apple test. If you're truly hungry, an apple should seem delicious; if it doesn't, something else is going on. Or you can try drinking water and making yourself busy, if you are still hungry try a healthy snack.  4. Not All Calories Are Created Equal The mechanics of weight loss are pretty simple: Take in fewer calories than you use for energy. But the kind of food you eat makes all the difference. Processed food that's high in saturated fat and refined starch or sugar can cause inflammation that disrupts the hormone signals that tell  your brain you're full. The result: You eat a lot more.  Use it to lose it. Clean up your diet. Swap in whole, unprocessed foods, including vegetables, lean protein, and healthy fats that will fill you up and give you the biggest nutritional bang for your calorie buck. In a few weeks, as your brain starts receiving regular hunger and fullness signals once again, you'll notice that you feel less hungry overall and naturally start cutting back on the amount you eat.  5. Protein, Produce, and Plant-Based Fats Are Your Weight-Loss Trinity Here's why eating the three Ps regularly will help you drop pounds. Protein fills you up. You need it to build lean muscle, which keeps your metabolism humming so that you can torch more fat. People in a weight-loss program who ate double the recommended daily allowance for protein (about 110 grams for a 150-pound woman) lost 70 percent of their weight from fat, while people who ate the RDA lost only about 40 percent, one study found. Produce is packed with filling fiber. "It's very difficult to consume too many calories if you're eating a lot of vegetables. Example: Three cups of broccoli is a lot of food, yet only 93 calories. (Fruit is another story. It can be easy to overeat and can contain a lot of calories from sugar, so be sure to monitor your intake.) Plant-based fats like olive oil and those in avocados and nuts are healthy and extra satiating.  Use it to lose it. Aim to incorporate each of the three Ps into every meal and snack. People who eat protein throughout the day are able to keep weight off, according to a study in the [place] of [employer]. In addition to meat, poultry and seafood, good sources are beans, lentils, eggs, tofu, and yogurt. As for fat, keep portion sizes in check by measuring out salad dressing, oil, and nut butters (shoot for one to two tablespoons). Finally, eat veggies or a little fruit at every meal. People who did that  consumed 308 fewer calories but didn't feel any hungrier than when they didn't eat more produce.  7. How You Eat Is As Important As What You Eat In order for your brain to register that you're full, you need to focus on what you're eating. Sit down whenever you eat, preferably at a table. Turn off the TV or computer, put down your phone, and look at your food. Smell it. Chew slowly, and don't put another bite on your fork until you swallow. When women ate lunch this attentively, they consumed 30 percent less when snacking later than those who listened to an audiobook at lunchtime, according to a study in the [employer] of [employer]. 8. Weighing Yourself Really Works The scale provides the best evidence about whether your efforts are paying off. Seeing the numbers tick up or down or stagnate is motivation to keep going-or  to rethink your approach. A 2015 study at [facility] found that daily weigh-ins helped people lose more weight, keep it off, and maintain that loss, even after two years. Use it to lose it. Step on the scale at the same time every day for the best results. If your weight shoots up several pounds from one weigh-in to the next, don't freak out. Eating a lot of salt the night before or having your period is the likely culprit. The number should return to normal in a day or two. It's a steady climb that you need to do something about. 9. Too Much Stress and Too Little Sleep Are Your Enemies When you're tired and frazzled, your body cranks up the production of cortisol, the stress hormone that can cause carb cravings. Not getting enough sleep also boosts your levels of ghrelin, a hormone associated with hunger, while suppressing leptin, a hormone that signals fullness and satiety. People on a diet who slept only five and a half hours a night for two weeks lost 55 percent less fat and were hungrier than those who slept eight and a half hours, according to a study in the [place]. Use it to lose it. Prioritize sleep, aiming for seven hours or more a night, which research shows helps lower stress. And make sure you're getting quality zzz's. If a snoring spouse or a fidgety cat wakes you up frequently throughout the night, you may end up getting the equivalent of just four hours of sleep, according to a study from [facility]. Keep pets out of the bedroom, and use a white-noise app to drown out snoring. 10. You Will Hit a plateau-And You Can Bust Through It As you slim down, your body releases much less leptin, the fullness hormone.  If you're not strength training, start right now. Building muscle can raise your metabolism to help you overcome a plateau. To keep your body challenged and burning calories, incorporate new moves and more intense intervals into your workouts or add another sweat session to your weekly routine. Alternatively, cut an extra 100 calories or so a day from your diet. Now that you've lost weight, your body simply doesn't need as much fuel.

## 2015-01-04 LAB — HEMOGLOBIN A1C
HEMOGLOBIN A1C: 5.9 % — AB (ref <5.7)
Mean Plasma Glucose: 123 mg/dL — ABNORMAL HIGH (ref <117)

## 2015-01-04 LAB — TSH: TSH: 1.537 u[IU]/mL (ref 0.350–4.500)

## 2015-01-05 LAB — HEPATIC FUNCTION PANEL
ALT: 26 U/L (ref 0–35)
AST: 29 U/L (ref 0–37)
Albumin: 4 g/dL (ref 3.5–5.2)
Alkaline Phosphatase: 109 U/L (ref 39–117)
Bilirubin, Direct: 0.1 mg/dL (ref 0.0–0.3)
Indirect Bilirubin: 0.7 mg/dL (ref 0.2–1.2)
Total Bilirubin: 0.8 mg/dL (ref 0.2–1.2)
Total Protein: 6.5 g/dL (ref 6.0–8.3)

## 2015-01-05 LAB — LIPID PANEL
CHOL/HDL RATIO: 3.9 ratio
CHOLESTEROL: 195 mg/dL (ref 0–200)
HDL: 50 mg/dL (ref >39)
LDL CALC: 108 mg/dL — AB (ref 0–99)
Triglycerides: 186 mg/dL — ABNORMAL HIGH (ref <150)
VLDL: 37 mg/dL (ref 0–40)

## 2015-01-05 LAB — MAGNESIUM: MAGNESIUM: 2.2 mg/dL (ref 1.5–2.5)

## 2015-01-22 ENCOUNTER — Encounter: Payer: Self-pay | Admitting: *Deleted

## 2015-01-31 DIAGNOSIS — N2581 Secondary hyperparathyroidism of renal origin: Secondary | ICD-10-CM | POA: Diagnosis not present

## 2015-01-31 DIAGNOSIS — N183 Chronic kidney disease, stage 3 (moderate): Secondary | ICD-10-CM | POA: Diagnosis not present

## 2015-04-04 DIAGNOSIS — D631 Anemia in chronic kidney disease: Secondary | ICD-10-CM | POA: Diagnosis not present

## 2015-04-04 DIAGNOSIS — M109 Gout, unspecified: Secondary | ICD-10-CM | POA: Diagnosis not present

## 2015-04-04 DIAGNOSIS — N2581 Secondary hyperparathyroidism of renal origin: Secondary | ICD-10-CM | POA: Diagnosis not present

## 2015-04-04 DIAGNOSIS — N183 Chronic kidney disease, stage 3 (moderate): Secondary | ICD-10-CM | POA: Diagnosis not present

## 2015-04-04 DIAGNOSIS — I129 Hypertensive chronic kidney disease with stage 1 through stage 4 chronic kidney disease, or unspecified chronic kidney disease: Secondary | ICD-10-CM | POA: Diagnosis not present

## 2015-04-11 ENCOUNTER — Ambulatory Visit (INDEPENDENT_AMBULATORY_CARE_PROVIDER_SITE_OTHER): Payer: Medicare Other | Admitting: Internal Medicine

## 2015-04-11 ENCOUNTER — Encounter: Payer: Self-pay | Admitting: Internal Medicine

## 2015-04-11 VITALS — BP 138/80 | HR 64 | Temp 97.5°F | Resp 16 | Ht 64.0 in | Wt 182.8 lb

## 2015-04-11 DIAGNOSIS — R7303 Prediabetes: Secondary | ICD-10-CM

## 2015-04-11 DIAGNOSIS — N183 Chronic kidney disease, stage 3 unspecified: Secondary | ICD-10-CM

## 2015-04-11 DIAGNOSIS — I10 Essential (primary) hypertension: Secondary | ICD-10-CM

## 2015-04-11 DIAGNOSIS — Z79899 Other long term (current) drug therapy: Secondary | ICD-10-CM | POA: Diagnosis not present

## 2015-04-11 DIAGNOSIS — M109 Gout, unspecified: Secondary | ICD-10-CM | POA: Diagnosis not present

## 2015-04-11 DIAGNOSIS — M1000 Idiopathic gout, unspecified site: Secondary | ICD-10-CM | POA: Insufficient documentation

## 2015-04-11 DIAGNOSIS — K219 Gastro-esophageal reflux disease without esophagitis: Secondary | ICD-10-CM

## 2015-04-11 DIAGNOSIS — E559 Vitamin D deficiency, unspecified: Secondary | ICD-10-CM

## 2015-04-11 DIAGNOSIS — R7309 Other abnormal glucose: Secondary | ICD-10-CM | POA: Diagnosis not present

## 2015-04-11 DIAGNOSIS — E785 Hyperlipidemia, unspecified: Secondary | ICD-10-CM

## 2015-04-11 LAB — BASIC METABOLIC PANEL WITH GFR
BUN: 47 mg/dL — AB (ref 6–23)
CO2: 29 mEq/L (ref 19–32)
Calcium: 9.8 mg/dL (ref 8.4–10.5)
Chloride: 98 mEq/L (ref 96–112)
Creat: 1.94 mg/dL — ABNORMAL HIGH (ref 0.50–1.10)
GFR, EST AFRICAN AMERICAN: 29 mL/min — AB
GFR, EST NON AFRICAN AMERICAN: 25 mL/min — AB
Glucose, Bld: 96 mg/dL (ref 70–99)
Potassium: 4.1 mEq/L (ref 3.5–5.3)
Sodium: 139 mEq/L (ref 135–145)

## 2015-04-11 LAB — CBC WITH DIFFERENTIAL/PLATELET
Basophils Absolute: 0 10*3/uL (ref 0.0–0.1)
Basophils Relative: 0 % (ref 0–1)
Eosinophils Absolute: 0.2 10*3/uL (ref 0.0–0.7)
Eosinophils Relative: 2 % (ref 0–5)
HCT: 34.8 % — ABNORMAL LOW (ref 36.0–46.0)
HEMOGLOBIN: 11.9 g/dL — AB (ref 12.0–15.0)
LYMPHS ABS: 2 10*3/uL (ref 0.7–4.0)
LYMPHS PCT: 23 % (ref 12–46)
MCH: 29 pg (ref 26.0–34.0)
MCHC: 34.2 g/dL (ref 30.0–36.0)
MCV: 84.7 fL (ref 78.0–100.0)
MPV: 10 fL (ref 8.6–12.4)
Monocytes Absolute: 0.7 10*3/uL (ref 0.1–1.0)
Monocytes Relative: 8 % (ref 3–12)
NEUTROS PCT: 67 % (ref 43–77)
Neutro Abs: 5.9 10*3/uL (ref 1.7–7.7)
Platelets: 303 10*3/uL (ref 150–400)
RBC: 4.11 MIL/uL (ref 3.87–5.11)
RDW: 14.8 % (ref 11.5–15.5)
WBC: 8.8 10*3/uL (ref 4.0–10.5)

## 2015-04-11 LAB — HEPATIC FUNCTION PANEL
ALBUMIN: 4.1 g/dL (ref 3.5–5.2)
ALK PHOS: 103 U/L (ref 39–117)
ALT: 19 U/L (ref 0–35)
AST: 22 U/L (ref 0–37)
BILIRUBIN DIRECT: 0.1 mg/dL (ref 0.0–0.3)
BILIRUBIN TOTAL: 0.7 mg/dL (ref 0.2–1.2)
Indirect Bilirubin: 0.6 mg/dL (ref 0.2–1.2)
TOTAL PROTEIN: 6.8 g/dL (ref 6.0–8.3)

## 2015-04-11 LAB — LIPID PANEL
Cholesterol: 167 mg/dL (ref 0–200)
HDL: 49 mg/dL (ref >=46)
LDL Cholesterol: 82 mg/dL (ref 0–99)
Total CHOL/HDL Ratio: 3.4 Ratio
Triglycerides: 181 mg/dL — ABNORMAL HIGH (ref <150)
VLDL: 36 mg/dL (ref 0–40)

## 2015-04-11 LAB — URIC ACID: Uric Acid, Serum: 8.7 mg/dL — ABNORMAL HIGH (ref 2.4–7.0)

## 2015-04-11 LAB — MAGNESIUM: MAGNESIUM: 2.1 mg/dL (ref 1.5–2.5)

## 2015-04-11 MED ORDER — RANITIDINE HCL 300 MG PO TABS: ORAL_TABLET | ORAL | Status: DC

## 2015-04-11 NOTE — Patient Instructions (Signed)

## 2015-04-11 NOTE — Progress Notes (Signed)
Patient ID: Kelly Cantu, female   DOB: 08/22/1941, 74 y.o.   MRN: [id number]   This very nice 74 y.o.female presents for 3 month follow up with Hypertension, Hyperlipidemia, Pre-Diabetes and Vitamin D Deficiency. Patient also has Stage 3 CKD (GFR 31 ml/min) and is followed closely by Dr Deterding.    Patient is treated for HTN & BP has been controlled at home. Today's BP: 138/80 mmHg. Patient has had no complaints of any cardiac type chest pain, palpitations, dyspnea/orthopnea/PND, dizziness, claudication, or dependent edema.   Hyperlipidemia is not controlled with diet & meds. Patient denies myalgias or other med SE's. Last Lipids were near, but not at goal - Total Chol 195; HDL 50; LDL 108; and sl elevated Trig 186 on 01/03/2015:   Also, the patient has history of PreDiabetes since may 2011 with an A1c of 5.9% and 6.0% in Aug 2014 and she's has had no symptoms of reactive hypoglycemia, diabetic polys, paresthesias or visual blurring.  Last A1c was 5.9% on 01/03/2015    Further, the patient also has history of Vitamin D Deficiency and supplements vitamin D without any suspected side-effects. Last vitamin D was 75 on 09/29/2014    Medication Sig  . albuterol (PROVENTIL HFA;VENTOLIN HFA) 108 (90 BASE) MCG/ACT inhaler Inhale 2 puffs into the lungs every 6 (six) hours as needed for wheezing or shortness of breath.  . allopurinol (ZYLOPRIM) 300 MG tablet TAKE 1 TABLET BY MOUTH EVERY DAY  . atenolol (TENORMIN) 50 MG tablet TAKE 1 TABLET Daily FOR BLOOD PRESSURE  . atorvastatin (LIPITOR) 20 MG tablet TAKE 1  TABLET DAILY AS DIRECTED  FOR CHOLESTEROL  . BABY ASPIRIN PO Take 81 mg by mouth daily.  Marland Kitchen esomeprazole (NEXIUM) 40 MG capsule TAKE 1 CAPSULE DAILY FOR ACID INDIGESTION  . furosemide (LASIX) 40 MG tablet TAKE 1 TABLET DAILY FOR BLOOD PRESSURE AND FLUID  . Multiple Vitamins-Minerals (MULTIVITAMIN PO) Take by mouth daily.  . Omega-3 Fatty Acids (FISH OIL PO) Take by mouth daily.  . Cholecalciferol  (VITAMIN D PO) Take by mouth daily.   Allergies  Allergen Reactions  . Ace Inhibitors     Pt unsure of reaction  . Feldene [Piroxicam] Rash   PMHx:   Past Medical History  Diagnosis Date  . Hyperlipidemia   . Hypertension   . Prediabetes   . GERD (gastroesophageal reflux disease)   . DJD (degenerative joint disease)   . DDD (degenerative disc disease), lumbar   . IBS (irritable bowel syndrome)   . Cataract   . Chronic kidney disease    Immunization History  Administered Date(s) Administered  . DT 09/29/2014  . Influenza Split 09/28/2013  . Influenza, High Dose Seasonal PF 10/13/2014  . Pneumococcal Conjugate-13 10/13/2014  . Pneumococcal Polysaccharide-23 09/23/2012  . Td 09/02/2004  . Zoster 10/13/2013   Past Surgical History  Procedure Laterality Date  . Abdominal hysterectomy    . Cataract extraction    . Colonoscopy  x 2    w/Dr Carlean Purl   FHx:    Reviewed / unchanged  SHx:    Reviewed / unchanged  Systems Review:  Constitutional: Denies fever, chills, wt changes, headaches, insomnia, fatigue, night sweats, change in appetite. Eyes: Denies redness, blurred vision, diplopia, discharge, itchy, watery eyes.  ENT: Denies discharge, congestion, post nasal drip, epistaxis, sore throat, earache, hearing loss, dental pain, tinnitus, vertigo, sinus pain, snoring.  CV: Denies chest pain, palpitations, irregular heartbeat, syncope, dyspnea, diaphoresis, orthopnea, PND, claudication or edema. Respiratory: denies cough, dyspnea,  DOE, pleurisy, hoarseness, laryngitis, wheezing.  Gastrointestinal: Denies dysphagia, odynophagia, heartburn, reflux, water brash, abdominal pain or cramps, nausea, vomiting, bloating, diarrhea, constipation, hematemesis, melena, hematochezia  or hemorrhoids. Genitourinary: Denies dysuria, frequency, urgency, nocturia, hesitancy, discharge, hematuria or flank pain. Musculoskeletal: Denies arthralgias, myalgias, stiffness, jt. swelling, pain, limping or  strain/sprain.  Skin: Denies pruritus, rash, hives, warts, acne, eczema or change in skin lesion(s). Neuro: No weakness, tremor, incoordination, spasms, paresthesia or pain. Psychiatric: Denies confusion, memory loss or sensory loss. Endo: Denies change in weight, skin or hair change.  Heme/Lymph: No excessive bleeding, bruising or enlarged lymph nodes.  Physical Exam  BP 138/80   Pulse 64  Temp 97.5 F  Resp 16  Ht 5\' 4"    Wt 182 lb 12.8 oz   BMI 31.36   Appears well nourished and in no distress. Eyes: PERRLA, EOMs, conjunctiva no swelling or erythema. Sinuses: No frontal/maxillary tenderness ENT/Mouth: EAC's clear, TM's nl w/o erythema, bulging. Nares clear w/o erythema, swelling, exudates. Oropharynx clear without erythema or exudates. Oral hygiene is good. Tongue normal, non obstructing. Hearing intact.  Neck: Supple. Thyroid nl. Car 2+/2+ without bruits, nodes or JVD. Chest: Respirations nl with BS clear & equal w/o rales, rhonchi, wheezing or stridor.  Cor: Heart sounds normal w/ regular rate and rhythm without sig. murmurs, gallops, clicks, or rubs. Peripheral pulses normal and equal  without edema.  Abdomen: Soft & bowel sounds normal. Non-tender w/o guarding, rebound, hernias, masses, or organomegaly.  Lymphatics: Unremarkable.  Musculoskeletal: Full ROM all peripheral extremities, joint stability, 5/5 strength, and normal gait.  Skin: Warm, dry without exposed rashes, lesions or ecchymosis apparent.  Neuro: Cranial nerves intact, reflexes equal bilaterally. Sensory-motor testing grossly intact. Tendon reflexes grossly intact.  Pysch: Alert & oriented x 3.  Insight and judgement nl & appropriate. No ideations.  Assessment and Plan:  1. Essential hypertension  - TSH  2. Hyperlipidemia  - Lipid panel  3. Prediabetes  - Hemoglobin A1c - Insulin, random  4. Vitamin D deficiency  - Vit D  25 hydroxy   5. CKD stage 3, GFR 30-59 ml/min   6. Gastroesophageal  reflux disease   - Discussed transition off Nexium to Ranitidine   7. Gout  - Uric acid  8. Medication management  - CBC with Differential/Platelet - BASIC METABOLIC PANEL WITH GFR - Hepatic function panel - Magnesium   Recommended regular exercise, BP monitoring, weight control, and discussed med and SE's. Recommended labs to assess and monitor clinical status. Further disposition pending results of labs. Over 30 minutes of exam, counseling, chart review was performed

## 2015-04-12 LAB — HEMOGLOBIN A1C
Hgb A1c MFr Bld: 6.2 % — ABNORMAL HIGH (ref <5.7)
Mean Plasma Glucose: 131 mg/dL — ABNORMAL HIGH (ref <117)

## 2015-04-12 LAB — INSULIN, RANDOM: INSULIN: 25.9 u[IU]/mL — AB (ref 2.0–19.6)

## 2015-04-12 LAB — VITAMIN D 25 HYDROXY (VIT D DEFICIENCY, FRACTURES): VIT D 25 HYDROXY: 48 ng/mL (ref 30–100)

## 2015-04-12 LAB — TSH: TSH: 1.198 u[IU]/mL (ref 0.350–4.500)

## 2015-06-13 DIAGNOSIS — N183 Chronic kidney disease, stage 3 (moderate): Secondary | ICD-10-CM | POA: Diagnosis not present

## 2015-07-25 ENCOUNTER — Ambulatory Visit (INDEPENDENT_AMBULATORY_CARE_PROVIDER_SITE_OTHER): Payer: Medicare Other | Admitting: Internal Medicine

## 2015-07-25 ENCOUNTER — Encounter: Payer: Self-pay | Admitting: Internal Medicine

## 2015-07-25 VITALS — BP 144/82 | HR 60 | Temp 98.2°F | Resp 16 | Ht 64.0 in | Wt 182.0 lb

## 2015-07-25 DIAGNOSIS — I10 Essential (primary) hypertension: Secondary | ICD-10-CM | POA: Diagnosis not present

## 2015-07-25 DIAGNOSIS — N183 Chronic kidney disease, stage 3 unspecified: Secondary | ICD-10-CM

## 2015-07-25 DIAGNOSIS — Z79899 Other long term (current) drug therapy: Secondary | ICD-10-CM

## 2015-07-25 DIAGNOSIS — E559 Vitamin D deficiency, unspecified: Secondary | ICD-10-CM | POA: Diagnosis not present

## 2015-07-25 DIAGNOSIS — R7309 Other abnormal glucose: Secondary | ICD-10-CM | POA: Diagnosis not present

## 2015-07-25 DIAGNOSIS — R7303 Prediabetes: Secondary | ICD-10-CM

## 2015-07-25 DIAGNOSIS — E785 Hyperlipidemia, unspecified: Secondary | ICD-10-CM | POA: Diagnosis not present

## 2015-07-25 LAB — LIPID PANEL
CHOLESTEROL: 172 mg/dL (ref 125–200)
HDL: 48 mg/dL (ref >=46)
LDL Cholesterol: 88 mg/dL (ref <130)
TRIGLYCERIDES: 180 mg/dL — AB (ref <150)
Total CHOL/HDL Ratio: 3.6 Ratio (ref <=5.0)
VLDL: 36 mg/dL — AB (ref <30)

## 2015-07-25 LAB — CBC WITH DIFFERENTIAL/PLATELET
Basophils Absolute: 0.1 10*3/uL (ref 0.0–0.1)
Basophils Relative: 1 % (ref 0–1)
EOS ABS: 0.1 10*3/uL (ref 0.0–0.7)
Eosinophils Relative: 1 % (ref 0–5)
HCT: 36.3 % (ref 36.0–46.0)
Hemoglobin: 12.3 g/dL (ref 12.0–15.0)
Lymphocytes Relative: 18 % (ref 12–46)
Lymphs Abs: 1.5 10*3/uL (ref 0.7–4.0)
MCH: 29.7 pg (ref 26.0–34.0)
MCHC: 33.9 g/dL (ref 30.0–36.0)
MCV: 87.7 fL (ref 78.0–100.0)
MONO ABS: 0.6 10*3/uL (ref 0.1–1.0)
MONOS PCT: 8 % (ref 3–12)
MPV: 9.7 fL (ref 8.6–12.4)
Neutro Abs: 5.8 10*3/uL (ref 1.7–7.7)
Neutrophils Relative %: 72 % (ref 43–77)
PLATELETS: 263 10*3/uL (ref 150–400)
RBC: 4.14 MIL/uL (ref 3.87–5.11)
RDW: 15.4 % (ref 11.5–15.5)
WBC: 8.1 10*3/uL (ref 4.0–10.5)

## 2015-07-25 LAB — HEPATIC FUNCTION PANEL
ALBUMIN: 4.2 g/dL (ref 3.6–5.1)
ALT: 27 U/L (ref 6–29)
AST: 29 U/L (ref 10–35)
Alkaline Phosphatase: 109 U/L (ref 33–130)
BILIRUBIN DIRECT: 0.2 mg/dL (ref <=0.2)
Indirect Bilirubin: 0.7 mg/dL (ref 0.2–1.2)
Total Bilirubin: 0.9 mg/dL (ref 0.2–1.2)
Total Protein: 7 g/dL (ref 6.1–8.1)

## 2015-07-25 LAB — BASIC METABOLIC PANEL WITH GFR
BUN: 30 mg/dL — AB (ref 7–25)
CALCIUM: 9.9 mg/dL (ref 8.6–10.4)
CHLORIDE: 96 mmol/L — AB (ref 98–110)
CO2: 27 mmol/L (ref 20–31)
CREATININE: 1.7 mg/dL — AB (ref 0.60–0.93)
GFR, Est African American: 34 mL/min — ABNORMAL LOW (ref >=60)
GFR, Est Non African American: 30 mL/min — ABNORMAL LOW (ref >=60)
Glucose, Bld: 104 mg/dL — ABNORMAL HIGH (ref 65–99)
Potassium: 4.3 mmol/L (ref 3.5–5.3)
SODIUM: 137 mmol/L (ref 135–146)

## 2015-07-25 LAB — HEMOGLOBIN A1C
Hgb A1c MFr Bld: 6.2 % — ABNORMAL HIGH (ref <5.7)
Mean Plasma Glucose: 131 mg/dL — ABNORMAL HIGH (ref <117)

## 2015-07-25 LAB — MAGNESIUM: MAGNESIUM: 1.8 mg/dL (ref 1.5–2.5)

## 2015-07-25 LAB — TSH: TSH: 1.838 u[IU]/mL (ref 0.350–4.500)

## 2015-07-25 MED ORDER — TRAMADOL HCL 50 MG PO TABS: 50.0000 mg | ORAL_TABLET | Freq: Four times a day (QID) | ORAL | Status: DC | PRN

## 2015-07-25 MED ORDER — PREDNISONE 20 MG PO TABS: ORAL_TABLET | ORAL | Status: DC

## 2015-07-25 NOTE — Progress Notes (Signed)
Patient ID: Kelly Cantu, female   DOB: 18-Feb-1941, 74 y.o.   MRN: [id number]  Assessment and Plan:  Hypertension:  -Continue medication,  -monitor blood pressure at home.  -Continue DASH diet.   -Reminder to go to the ER if any CP, SOB, nausea, dizziness, severe HA, changes vision/speech, left arm numbness and tingling, and jaw pain.  Cholesterol: -Continue diet and exercise.  -Check cholesterol.   Pre-diabetes: -Continue diet and exercise.  -Check A1C  Vitamin D Def: -check level -continue medications.   Back Pain -prednisone -tramadol  Continue diet and meds as discussed. Further disposition pending results of labs.  HPI 74 y.o. female  presents for 3 month follow up with hypertension, hyperlipidemia, prediabetes and vitamin D.   Her blood pressure has been controlled at home, today their BP is BP: (!) 144/82 mmHg.   She does workout. She denies chest pain, shortness of breath, dizziness.   She is on cholesterol medication and denies myalgias. Her cholesterol is at goal. The cholesterol last visit was:   Lab Results  Component Value Date   CHOL 167 04/11/2015   HDL 49 04/11/2015   LDLCALC 82 04/11/2015   TRIG 181* 04/11/2015   CHOLHDL 3.4 04/11/2015     She has been working on diet and exercise for prediabetes, and denies foot ulcerations, hyperglycemia, hypoglycemia , increased appetite, nausea, paresthesia of the feet, polydipsia, polyuria, visual disturbances, vomiting and weight loss. Last A1C in the office was:  Lab Results  Component Value Date   HGBA1C 6.2* 04/11/2015    Patient is on Vitamin D supplement.  Lab Results  Component Value Date   VD25OH 48 04/11/2015     Patient reports that her back has started to bother her again 1 month ago.  She reports that the pain comes and goes.  She has been taking tylenol for her pain and arthritis.  She reports that this helps a little bit.    Current Medications:  Current Outpatient Prescriptions on File Prior to  Visit  Medication Sig Dispense Refill  . albuterol (PROVENTIL HFA;VENTOLIN HFA) 108 (90 BASE) MCG/ACT inhaler Inhale 2 puffs into the lungs every 6 (six) hours as needed for wheezing or shortness of breath. 1 Inhaler 2  . allopurinol (ZYLOPRIM) 300 MG tablet TAKE 1 TABLET BY MOUTH EVERY DAY 90 tablet 99  . atenolol (TENORMIN) 50 MG tablet TAKE 1 TABLET Daily FOR BLOOD PRESSURE 90 tablet 99  . atorvastatin (LIPITOR) 20 MG tablet TAKE 1  TABLET DAILY AS DIRECTED  FOR CHOLESTEROL 90 tablet 99  . BABY ASPIRIN PO Take 81 mg by mouth daily.    . calcitRIOL (ROCALTROL) 0.25 MCG capsule Take 0.25 mcg by mouth daily.    Marland Kitchen esomeprazole (NEXIUM) 40 MG capsule TAKE 1 CAPSULE DAILY FOR ACID INDIGESTION 90 capsule 99  . furosemide (LASIX) 40 MG tablet TAKE 1 TABLET DAILY FOR BLOOD PRESSURE AND FLUID 90 tablet 4  . Multiple Vitamins-Minerals (MULTIVITAMIN PO) Take by mouth daily.    . Omega-3 Fatty Acids (FISH OIL PO) Take by mouth daily.    . ranitidine (ZANTAC) 300 MG tablet Take 1 to 2 tablets daily for acid indigestion and reflux to allow transition off Nexium. 180 tablet 0   No current facility-administered medications on file prior to visit.    Medical History:  Past Medical History  Diagnosis Date  . Hyperlipidemia   . Hypertension   . Prediabetes   . GERD (gastroesophageal reflux disease)   . DJD (degenerative joint  disease)   . DDD (degenerative disc disease), lumbar   . IBS (irritable bowel syndrome)   . Cataract   . Chronic kidney disease     Allergies:  Allergies  Allergen Reactions  . Ace Inhibitors     Pt unsure of reaction  . Feldene [Piroxicam] Rash     Review of Systems:  Review of Systems  Constitutional: Negative for fever, chills and malaise/fatigue.  HENT: Negative for congestion, ear pain and sore throat.   Eyes: Negative.   Respiratory: Negative for cough, shortness of breath and wheezing.   Cardiovascular: Negative for chest pain, palpitations and leg swelling.   Gastrointestinal: Negative for heartburn, diarrhea, constipation, blood in stool and melena.  Genitourinary: Negative.   Skin: Negative.   Neurological: Negative for dizziness, sensory change, loss of consciousness and headaches.  Psychiatric/Behavioral: Negative for depression. The patient is not nervous/anxious and does not have insomnia.     Family history- Review and unchanged  Social history- Review and unchanged  Physical Exam: BP 144/82 mmHg  Pulse 60  Temp(Src) 98.2 F (36.8 C) (Temporal)  Resp 16  Ht 5\' 4"  (1.626 m)  Wt 182 lb (82.555 kg)  BMI 31.22 kg/m2 Wt Readings from Last 3 Encounters:  07/25/15 182 lb (82.555 kg)  04/11/15 182 lb 12.8 oz (82.918 kg)  01/03/15 185 lb (83.915 kg)    General Appearance: Well nourished well developed, in no apparent distress. Eyes: PERRLA, EOMs, conjunctiva no swelling or erythema ENT/Mouth: Ear canals normal without obstruction, swelling, erythma, discharge.  TMs normal bilaterally.  Oropharynx moist, clear, without exudate, or postoropharyngeal swelling. Neck: Supple, thyroid normal,no cervical adenopathy  Respiratory: Respiratory effort normal, Breath sounds clear A&P without rhonchi, wheeze, or rale.  No retractions, no accessory usage. Cardio: RRR with no MRGs. Brisk peripheral pulses without edema.  Abdomen: Soft, + BS,  Non tender, no guarding, rebound, hernias, masses. Musculoskeletal: Full ROM, 5/5 strength, Normal gait Skin: Warm, dry without rashes, lesions, ecchymosis.  Neuro: Awake and oriented X 3, Cranial nerves intact. Normal muscle tone, no cerebellar symptoms. Psych: Normal affect, Insight and Judgment appropriate.    [name], PA-C 2:41 PM [facility]

## 2015-07-26 LAB — INSULIN, RANDOM: INSULIN: 25.3 u[IU]/mL — AB (ref 2.0–19.6)

## 2015-07-26 LAB — VITAMIN D 25 HYDROXY (VIT D DEFICIENCY, FRACTURES): VIT D 25 HYDROXY: 47 ng/mL (ref 30–100)

## 2015-08-29 DIAGNOSIS — M10379 Gout due to renal impairment, unspecified ankle and foot: Secondary | ICD-10-CM | POA: Diagnosis not present

## 2015-08-29 DIAGNOSIS — N183 Chronic kidney disease, stage 3 (moderate): Secondary | ICD-10-CM | POA: Diagnosis not present

## 2015-08-29 DIAGNOSIS — Z23 Encounter for immunization: Secondary | ICD-10-CM | POA: Diagnosis not present

## 2015-08-29 DIAGNOSIS — M1990 Unspecified osteoarthritis, unspecified site: Secondary | ICD-10-CM | POA: Diagnosis not present

## 2015-08-29 DIAGNOSIS — I129 Hypertensive chronic kidney disease with stage 1 through stage 4 chronic kidney disease, or unspecified chronic kidney disease: Secondary | ICD-10-CM | POA: Diagnosis not present

## 2015-09-20 ENCOUNTER — Ambulatory Visit (INDEPENDENT_AMBULATORY_CARE_PROVIDER_SITE_OTHER): Payer: Medicare Other | Admitting: Internal Medicine

## 2015-09-20 ENCOUNTER — Encounter: Payer: Self-pay | Admitting: Internal Medicine

## 2015-09-20 VITALS — BP 154/70 | HR 60 | Temp 98.2°F | Resp 16 | Ht 64.0 in | Wt 185.0 lb

## 2015-09-20 DIAGNOSIS — Z23 Encounter for immunization: Secondary | ICD-10-CM

## 2015-09-20 DIAGNOSIS — G5600 Carpal tunnel syndrome, unspecified upper limb: Secondary | ICD-10-CM | POA: Diagnosis not present

## 2015-09-20 NOTE — Addendum Note (Signed)
Addended by: Alvah Gilder A on: 09/20/2015 03:03 PM   Modules accepted: Orders

## 2015-09-20 NOTE — Progress Notes (Signed)
   Subjective:    Patient ID: Kelly Cantu, female    DOB: March 07, 1941, 74 y.o.   MRN: [id number]  HPI  Patient presents to the office for evaluation of bilateral hand numbness that has been getting worse over the last couple weeks.  Numbness seems to happen at all hours of the day and night.  She reports that she has numbness all over her hands.  Nothing seems to bring it on other than holding a book.  She reports that she has been trying to hold her hands out when she is sleeping.  She reports that nothing seems to stop the tingling and numbness.  No neck pain, no shoulder, no swelling or color changes in hands.  She has been taking tylenol and doesn't seem to help tremendously.  She has never seen orthopedics here in [place].   Review of Systems  Constitutional: Negative for fever, chills and fatigue.  Musculoskeletal: Positive for arthralgias. Negative for neck pain.  Neurological: Positive for numbness.       Objective:   Physical Exam  Constitutional: She is oriented to person, place, and time. She appears well-developed and well-nourished. No distress.  HENT:  Head: Normocephalic.  Mouth/Throat: Oropharynx is clear and moist. No oropharyngeal exudate.  Eyes: Conjunctivae are normal. No scleral icterus.  Neck: Normal range of motion. Neck supple. No JVD present. No thyromegaly present.  Cardiovascular: Normal rate, regular rhythm, normal heart sounds and intact distal pulses.  Exam reveals no gallop and no friction rub.   No murmur heard. Pulmonary/Chest: Effort normal and breath sounds normal. No respiratory distress. She has no wheezes. She has no rales. She exhibits no tenderness.  Musculoskeletal: She exhibits no edema or tenderness.       Right hand: She exhibits normal range of motion, no tenderness, no bony tenderness, normal two-point discrimination, normal capillary refill, no deformity, no laceration and no swelling. Normal sensation noted. Decreased strength noted. She  exhibits thumb/finger opposition.       Left hand: She exhibits normal range of motion, no tenderness, no bony tenderness, normal two-point discrimination, normal capillary refill, no deformity, no laceration and no swelling. Normal sensation noted. Decreased strength noted. She exhibits thumb/finger opposition.  Lymphadenopathy:    She has no cervical adenopathy.  Neurological: She is alert and oriented to person, place, and time.  Skin: Skin is warm and dry. She is not diaphoretic.  Psychiatric: She has a normal mood and affect. Her behavior is normal. Judgment and thought content normal.  Nursing note and vitals reviewed.  Filed Vitals:   09/20/15 1403  BP: 154/70  Pulse: 60  Temp: 98.2 F (36.8 C)  Resp: 16          Assessment & Plan:    1. Carpal tunnel syndrome, unspecified laterality -night cockup splints - Ambulatory referral to Orthopedics

## 2015-09-20 NOTE — Patient Instructions (Signed)
Please buy cockup wrist splint to wear at nighttime.  Please call us if you haven't heard from [place] in 1 week about your appointment with orthopedics.      Carpal Tunnel Syndrome Carpal tunnel syndrome is a condition that causes pain in your hand and arm. The carpal tunnel is a narrow area located on the palm side of your wrist. Repeated wrist motion or certain diseases may cause swelling within the tunnel. This swelling pinches the main nerve in the wrist (median nerve). CAUSES  This condition may be caused by:   Repeated wrist motions.  Wrist injuries.  Arthritis.  A cyst or tumor in the carpal tunnel.  Fluid buildup during pregnancy. Sometimes the cause of this condition is not known.  RISK FACTORS This condition is more likely to develop in:   People who have jobs that cause them to repeatedly move their wrists in the same motion, such as butchers and cashiers.  Women.  People with certain conditions, such as:  Diabetes.  Obesity.  An underactive thyroid (hypothyroidism).  Kidney failure. SYMPTOMS  Symptoms of this condition include:   A tingling feeling in your fingers, especially in your thumb, index, and middle fingers.  Tingling or numbness in your hand.  An aching feeling in your entire arm, especially when your wrist and elbow are bent for long periods of time.  Wrist pain that goes up your arm to your shoulder.  Pain that goes down into your palm or fingers.  A weak feeling in your hands. You may have trouble grabbing and holding items. Your symptoms may feel worse during the night.  DIAGNOSIS  This condition is diagnosed with a medical history and physical exam. You may also have tests, including:   An electromyogram (EMG). This test measures electrical signals sent by your nerves into the muscles.  X-rays. TREATMENT  Treatment for this condition includes:  Lifestyle changes. It is important to stop doing or modify the activity that caused your  condition.  Physical or occupational therapy.  Medicines for pain and inflammation. This may include medicine that is injected into your wrist.  A wrist splint.  Surgery. HOME CARE INSTRUCTIONS  If You Have a Splint:  Wear it as told by your health care provider. Remove it only as told by your health care provider.  Loosen the splint if your fingers become numb and tingle, or if they turn cold and blue.  Keep the splint clean and dry. General Instructions  Take over-the-counter and prescription medicines only as told by your health care provider.  Rest your wrist from any activity that may be causing your pain. If your condition is work related, talk to your employer about changes that can be made, such as getting a wrist pad to use while typing.  If directed, apply ice to the painful area:  Put ice in a plastic bag.  Place a towel between your skin and the bag.  Leave the ice on for 20 minutes, 2-3 times per day.  Keep all follow-up visits as told by your health care provider. This is important.  Do any exercises as told by your health care provider, physical therapist, or occupational therapist. [place] IF:   You have new symptoms.  Your pain is not controlled with medicines.  Your symptoms get worse.   This information is not intended to replace advice given to you by your health care provider. Make sure you discuss any questions you have with your health care  provider.   Document Released: 11/16/2000 Document Revised: 08/10/2015 Document Reviewed: 04/06/2015 Elsevier Interactive Patient Education [employer].

## 2015-09-27 ENCOUNTER — Encounter: Payer: Self-pay | Admitting: Internal Medicine

## 2015-10-11 ENCOUNTER — Encounter: Payer: Self-pay | Admitting: Internal Medicine

## 2015-10-20 ENCOUNTER — Other Ambulatory Visit: Payer: Self-pay | Admitting: Internal Medicine

## 2015-11-11 ENCOUNTER — Encounter: Payer: Self-pay | Admitting: Internal Medicine

## 2015-11-11 ENCOUNTER — Ambulatory Visit (INDEPENDENT_AMBULATORY_CARE_PROVIDER_SITE_OTHER): Payer: Medicare Other | Admitting: Internal Medicine

## 2015-11-11 VITALS — BP 158/80 | HR 60 | Temp 97.5°F | Resp 16 | Ht 63.5 in | Wt 183.2 lb

## 2015-11-11 DIAGNOSIS — E669 Obesity, unspecified: Secondary | ICD-10-CM | POA: Insufficient documentation

## 2015-11-11 DIAGNOSIS — E559 Vitamin D deficiency, unspecified: Secondary | ICD-10-CM | POA: Diagnosis not present

## 2015-11-11 DIAGNOSIS — I10 Essential (primary) hypertension: Secondary | ICD-10-CM

## 2015-11-11 DIAGNOSIS — Z0001 Encounter for general adult medical examination with abnormal findings: Secondary | ICD-10-CM | POA: Diagnosis not present

## 2015-11-11 DIAGNOSIS — R6889 Other general symptoms and signs: Secondary | ICD-10-CM

## 2015-11-11 DIAGNOSIS — K219 Gastro-esophageal reflux disease without esophagitis: Secondary | ICD-10-CM | POA: Diagnosis not present

## 2015-11-11 DIAGNOSIS — Z0000 Encounter for general adult medical examination without abnormal findings: Secondary | ICD-10-CM

## 2015-11-11 DIAGNOSIS — E785 Hyperlipidemia, unspecified: Secondary | ICD-10-CM | POA: Diagnosis not present

## 2015-11-11 DIAGNOSIS — Z9181 History of falling: Secondary | ICD-10-CM

## 2015-11-11 DIAGNOSIS — Z79899 Other long term (current) drug therapy: Secondary | ICD-10-CM | POA: Diagnosis not present

## 2015-11-11 DIAGNOSIS — M1000 Idiopathic gout, unspecified site: Secondary | ICD-10-CM | POA: Diagnosis not present

## 2015-11-11 DIAGNOSIS — R7309 Other abnormal glucose: Secondary | ICD-10-CM

## 2015-11-11 DIAGNOSIS — N183 Chronic kidney disease, stage 3 unspecified: Secondary | ICD-10-CM

## 2015-11-11 DIAGNOSIS — Z789 Other specified health status: Secondary | ICD-10-CM

## 2015-11-11 DIAGNOSIS — Z6831 Body mass index (BMI) 31.0-31.9, adult: Secondary | ICD-10-CM

## 2015-11-11 DIAGNOSIS — R7303 Prediabetes: Secondary | ICD-10-CM | POA: Diagnosis not present

## 2015-11-11 DIAGNOSIS — Z1389 Encounter for screening for other disorder: Secondary | ICD-10-CM | POA: Diagnosis not present

## 2015-11-11 DIAGNOSIS — M109 Gout, unspecified: Secondary | ICD-10-CM | POA: Diagnosis not present

## 2015-11-11 DIAGNOSIS — Z1331 Encounter for screening for depression: Secondary | ICD-10-CM

## 2015-11-11 DIAGNOSIS — Z1212 Encounter for screening for malignant neoplasm of rectum: Secondary | ICD-10-CM

## 2015-11-11 LAB — MAGNESIUM: MAGNESIUM: 1.8 mg/dL (ref 1.5–2.5)

## 2015-11-11 LAB — BASIC METABOLIC PANEL WITH GFR
BUN: 37 mg/dL — AB (ref 7–25)
CALCIUM: 9.6 mg/dL (ref 8.6–10.4)
CHLORIDE: 100 mmol/L (ref 98–110)
CO2: 28 mmol/L (ref 20–31)
CREATININE: 1.96 mg/dL — AB (ref 0.60–0.93)
GFR, Est African American: 29 mL/min — ABNORMAL LOW (ref >=60)
GFR, Est Non African American: 25 mL/min — ABNORMAL LOW (ref >=60)
GLUCOSE: 126 mg/dL — AB (ref 65–99)
Potassium: 4 mmol/L (ref 3.5–5.3)
SODIUM: 140 mmol/L (ref 135–146)

## 2015-11-11 LAB — HEPATIC FUNCTION PANEL
ALBUMIN: 4 g/dL (ref 3.6–5.1)
ALT: 15 U/L (ref 6–29)
AST: 20 U/L (ref 10–35)
Alkaline Phosphatase: 100 U/L (ref 33–130)
BILIRUBIN DIRECT: 0.2 mg/dL (ref <=0.2)
Indirect Bilirubin: 0.5 mg/dL (ref 0.2–1.2)
TOTAL PROTEIN: 6.5 g/dL (ref 6.1–8.1)
Total Bilirubin: 0.7 mg/dL (ref 0.2–1.2)

## 2015-11-11 LAB — CBC WITH DIFFERENTIAL/PLATELET
BASOS PCT: 1 % (ref 0–1)
Basophils Absolute: 0.1 10*3/uL (ref 0.0–0.1)
Eosinophils Absolute: 0.2 10*3/uL (ref 0.0–0.7)
Eosinophils Relative: 2 % (ref 0–5)
HEMATOCRIT: 35.1 % — AB (ref 36.0–46.0)
HEMOGLOBIN: 11.7 g/dL — AB (ref 12.0–15.0)
LYMPHS ABS: 1.7 10*3/uL (ref 0.7–4.0)
Lymphocytes Relative: 22 % (ref 12–46)
MCH: 30.1 pg (ref 26.0–34.0)
MCHC: 33.3 g/dL (ref 30.0–36.0)
MCV: 90.2 fL (ref 78.0–100.0)
MONO ABS: 0.8 10*3/uL (ref 0.1–1.0)
MONOS PCT: 10 % (ref 3–12)
MPV: 10 fL (ref 8.6–12.4)
NEUTROS ABS: 5.1 10*3/uL (ref 1.7–7.7)
NEUTROS PCT: 65 % (ref 43–77)
Platelets: 295 10*3/uL (ref 150–400)
RBC: 3.89 MIL/uL (ref 3.87–5.11)
RDW: 14.7 % (ref 11.5–15.5)
WBC: 7.8 10*3/uL (ref 4.0–10.5)

## 2015-11-11 LAB — TSH: TSH: 1.541 u[IU]/mL (ref 0.350–4.500)

## 2015-11-11 LAB — URIC ACID: URIC ACID, SERUM: 5.6 mg/dL (ref 2.4–7.0)

## 2015-11-11 LAB — LIPID PANEL
CHOL/HDL RATIO: 3.4 ratio (ref <=5.0)
CHOLESTEROL: 155 mg/dL (ref 125–200)
HDL: 45 mg/dL — ABNORMAL LOW (ref >=46)
LDL Cholesterol: 74 mg/dL (ref <130)
Triglycerides: 178 mg/dL — ABNORMAL HIGH (ref <150)
VLDL: 36 mg/dL — ABNORMAL HIGH (ref <30)

## 2015-11-11 LAB — HEMOGLOBIN A1C
HEMOGLOBIN A1C: 6.2 % — AB (ref <5.7)
MEAN PLASMA GLUCOSE: 131 mg/dL — AB (ref <117)

## 2015-11-11 NOTE — Patient Instructions (Signed)

## 2015-11-12 ENCOUNTER — Other Ambulatory Visit: Payer: Self-pay | Admitting: Internal Medicine

## 2015-11-12 LAB — URINALYSIS, ROUTINE W REFLEX MICROSCOPIC
BILIRUBIN URINE: NEGATIVE
GLUCOSE, UA: NEGATIVE
Hgb urine dipstick: NEGATIVE
KETONES UR: NEGATIVE
Leukocytes, UA: NEGATIVE
Nitrite: NEGATIVE
Protein, ur: NEGATIVE
SPECIFIC GRAVITY, URINE: 1.019 (ref 1.001–1.035)
pH: 5 (ref 5.0–8.0)

## 2015-11-12 LAB — MICROALBUMIN / CREATININE URINE RATIO
CREATININE, URINE: 219 mg/dL (ref 20–320)
MICROALB/CREAT RATIO: 3 ug/mg{creat} (ref <30)
Microalb, Ur: 0.7 mg/dL

## 2015-11-12 LAB — VITAMIN D 25 HYDROXY (VIT D DEFICIENCY, FRACTURES): VIT D 25 HYDROXY: 46 ng/mL (ref 30–100)

## 2015-11-12 LAB — INSULIN, RANDOM: Insulin: 26.2 u[IU]/mL — ABNORMAL HIGH (ref 2.0–19.6)

## 2015-11-13 ENCOUNTER — Encounter: Payer: Self-pay | Admitting: Internal Medicine

## 2015-11-13 DIAGNOSIS — Z0000 Encounter for general adult medical examination without abnormal findings: Secondary | ICD-10-CM | POA: Insufficient documentation

## 2015-11-13 DIAGNOSIS — Z136 Encounter for screening for cardiovascular disorders: Secondary | ICD-10-CM | POA: Insufficient documentation

## 2015-11-13 NOTE — Progress Notes (Signed)
Patient ID: Kelly Cantu, female   DOB: 30-Nov-1941, 74 y.o.   MRN: [id number]  Medicare  Annual  Wellness Visit And  Comprehensive Evaluation & Examination   Assessment:   1. Essential hypertension  - Microalbumin / creatinine urine ratio - EKG 12-Lead - TSH  2. Hyperlipidemia  - Lipid panel - TSH  3. Prediabetes  - Hemoglobin A1c - Insulin, random  4. Other abnormal glucose  - Hemoglobin A1c - Insulin, random  5. Idiopathic gout  - Uric acid  6. CKD (chronic kidney disease) stage 3, GFR 30-59 ml/min   7. Gastroesophageal reflux disease   8. BMI 31.0-31.9,adult   9. Medication management  - Urinalysis, Routine w reflex microscopic  - CBC with Differential/Platelet - BASIC METABOLIC PANEL WITH GFR - Hepatic function panel - Magnesium  10. Screening for rectal cancer  - POC Hemoccult Bld/Stl   11. Depression screen   12. Vitamin D deficiency  - VITAMIN D 25 Hydroxy   13. Encounter for general adult medical examination with abnormal findings   14. Medicare annual wellness visit, subsequent   15. At low risk for fall  Plan:   During the course of the visit the patient was educated and counseled about appropriate screening and preventive services including:    Pneumococcal vaccine   Influenza vaccine  Td vaccine  Screening electrocardiogram  Bone densitometry screening  Colorectal cancer screening  Diabetes screening  Glaucoma screening  Nutrition counseling   Advanced directives: requested  Screening recommendations, referrals: Vaccinations:  Immunization History  Administered Date(s) Administered  . DT 09/29/2014, 09/20/2015  . Influenza Split 09/28/2013  . Influenza, High Dose Seasonal PF 10/13/2014  . Influenza-Unspecified 08/04/2015  . Pneumococcal Conjugate-13 10/13/2014  . Pneumococcal Polysaccharide-23 09/23/2012  . Td 09/02/2004  . Zoster 10/13/2013  Hep B vaccine not indicated  Nutrition assessed and  recommended  Colonoscopy 08/30/2014 Recommended yearly ophthalmology/optometry visit for glaucoma screening and checkup Recommended yearly dental visit for hygiene and checkup Advanced directives - Yes  Conditions/risks identified: BMI: Discussed weight loss, diet, and increase physical activity.  Increase physical activity: AHA recommends 150 minutes of physical activity a week.  Medications reviewed PreDiabetes is at goal, ACE/ARB therapy: Not Indicated Urinary Incontinence is not an issue: discussed non pharmacology and pharmacology options.  Fall risk: low- discussed PT, home fall assessment, medications.   Subjective:    Kelly Cantu  presents for [employer] Visit and presents for a comprehensive evaluation, examination and management of multiple medical co-morbidities.  Date of last medicare wellness visit was 04/27/2014.  This very nice 74 y.o.  [name] presents for follow up with Hypertension, Hyperlipidemia, Pre-Diabetes and Vitamin D Deficiency.    Patient is treated for HTN since 1991 & BP has been controlled at home., altho today's BP is 158/80 , rechecked at 144 80. Patient has CKD 3 and is also followed closely by Dr Deterding every 3-4 monthsPatient has had no complaints of any cardiac type chest pain, palpitations, dyspnea/orthopnea/PND, dizziness, claudication, or dependent edema.   Hyperlipidemia is controlled with diet & meds. Patient denies myalgias or other med SE's. Current Lipids are at goal with  Cholesterol 155; HDL 45*; LDL 74; & sl elevated Triglycerides 178.     Also, the patient has history of Morbid Obesity (BMI 31+) and consequent PreDiabetes with A1c 5.9% in May 2014 and then 6.0% in Aug 2014 and has had no symptoms of reactive hypoglycemia, diabetic polys, paresthesias or visual blurring.  Current  A1c is not at  goal with A1c 6.2%.     Further, the patient also has history of Vitamin D Deficiency of 34 in 2008  and supplements vitamin D without any  suspected side-effects. Current  vitamin Dis still relatively low at 46.   Names of Other Physician/Practitioners you currently use: 1. [facility] here for primary care 2. Dr Macarthur Critchley, [place], eye doctor, last visit 2015 3. Dr Randol Kern, [place], dentist, last visit Sept 2016 & every 6 months.  Patient Care Team: Unk Pinto, MD as PCP - General (Internal Medicine) Mauricia Area, MD as Consulting Physician (Nephrology) Gatha Mayer, MD as Consulting Physician (Gastroenterology) Macarthur Critchley, OD as Referring Physician (Optometry)  Medication Review: Medication Sig  . albuterol  HFA inhaler Inhale 2 puffs into the lungs every 6 (six) hours as needed for wheezing or shortness of breath.  . allopurinol 300 MG tablet TAKE 1 TABLET BY MOUTH EVERY DAY  . atenolol  50 MG tablet TAKE 1 TABLET DAILY FOR BLOOD PRESSURE  . atorvastatin 20 MG tablet TAKE 1  TABLET DAILY AS DIRECTED  FOR CHOLESTEROL  . BABY ASPIRIN  Take 81 mg by mouth daily.  . calcitRIOL (ROCALTROL) 0.25 MCG  Take 0.25 mcg by mouth daily.  Marland Kitchen esomeprazole  40 MG capsule TAKE 1 CAPSULE DAILY FOR ACID INDIGESTION  . furosemide (LASIX) 40 MG tablet TAKE 1 TABLET DAILY FOR BLOOD PRESSURE AND FLUID  . Multiple Vitamins-Minerals  Take by mouth daily.  . Omega-3 FISH OIL  Take by mouth daily.  . traMADol  50 MG tablet Take 1 tablet (50 mg total) by mouth every 6 (six) hours as needed.  . ranitidine  300 MG tablet Take 1 to 2 tablets daily for acid indigestion and reflux to allow transition off Nexium.   Allergies  Allergen Reactions  . Ace Inhibitors     Pt unsure of reaction  . Feldene [Piroxicam] Rash   Current Problems (verified) Patient Active Problem List   Diagnosis Date Noted  . Medicare annual wellness visit, subsequent 11/13/2015  . Other abnormal glucose 11/11/2015  . BMI 31.0-31.9,adult 11/11/2015  . Gout 04/11/2015  . Morbid obesity ([place]) 01/03/2015  . Vitamin D deficiency  09/29/2014  . Medication management 09/29/2014  . CKD (chronic kidney disease) stage 3, GFR 30-59 ml/min 07/22/2014  . Hyperlipidemia   . Hypertension   . Prediabetes   . GERD (gastroesophageal reflux disease)   . DJD (degenerative joint disease)   . DDD (degenerative disc disease), lumbar   . IBS (irritable bowel syndrome)    Screening Tests Health Maintenance  Topic Date Due  . INFLUENZA VACCINE  07/03/2016  . MAMMOGRAM  10/19/2016  . COLONOSCOPY  08/30/2024  . TETANUS/TDAP  09/19/2025  . DEXA SCAN  Completed  . ZOSTAVAX  Completed  . PNA vac Low Risk Adult  Completed   Immunization History  Administered Date(s) Administered  . DT 09/29/2014, 09/20/2015  . Influenza Split 09/28/2013  . Influenza, High Dose Seasonal PF 10/13/2014  . Influenza-Unspecified 08/04/2015  . Pneumococcal Conjugate-13 10/13/2014  . Pneumococcal Polysaccharide-23 09/23/2012  . Td 09/02/2004  . Zoster 10/13/2013   Preventative care: Last colonoscopy: 08/30/2014  Past Medical History  Diagnosis Date  . Hyperlipidemia   . Hypertension   . Prediabetes   . GERD (gastroesophageal reflux disease)   . DJD (degenerative joint disease)   . DDD (degenerative disc disease), lumbar   . IBS (irritable bowel syndrome)   . Cataract   . Chronic  kidney disease    Past Surgical History  Procedure Laterality Date  . Abdominal hysterectomy    . Cataract extraction    . Colonoscopy  x 2    w/Dr Carlean Purl   Risk Factors: Tobacco Social History  Substance Use Topics  . Smoking status: Former Smoker    Quit date: 06/16/2004  . Smokeless tobacco: Never Used  . Alcohol Use: No   She does not smoke.  Patient is a former smoker. Are there smokers in your home (other than you)?  No Alcohol Current alcohol use: none  Caffeine Current caffeine use: coffee 0-1 cups occasionally /day  Exercise Current exercise: housecleaning, walking and yard work  Nutrition/Diet Current diet: in general, a "healthy"  diet    Cardiac risk factors: advanced age (older than 34 for men, 2 for women), dyslipidemia, hypertension, obesity (BMI >= 30 kg/m2), sedentary lifestyle and smoking/ tobacco exposure.  Depression Screen (Note: if answer to either of the following is "Yes", a more complete depression screening is indicated)   Q1: Over the past two weeks, have you felt down, depressed or hopeless? No  Q2: Over the past two weeks, have you felt little interest or pleasure in doing things? No  Have you lost interest or pleasure in daily life? No  Do you often feel hopeless? No  Do you cry easily over simple problems? No  Activities of Daily Living In your present state of health, do you have any difficulty performing the following activities?:  Driving? No Managing money?  No Feeding yourself? No Getting from bed to chair? No Climbing a flight of stairs? No Preparing food and eating?: No Bathing or showering? No Getting dressed: No Getting to the toilet? No Using the toilet:No Moving around from place to place: No In the past year have you fallen or had a near fall?:No   Are you sexually active?  No  Do you have more than one partner?  No  Vision Difficulties: No  Hearing Difficulties: No Do you often ask people to speak up or repeat themselves? No Do you experience ringing or noises in your ears? No Do you have difficulty understanding soft or whispered voices? Sometimes.  Cognition  Do you feel that you have a problem with memory?No  Do you often misplace items? No  Do you feel safe at home?  Yes  Advanced directives Does patient have a [place]? Yes Does patient have a Living Will? Yes  ROS: Constitutional: Denies fever, chills, weight loss/gain, headaches, insomnia, fatigue, night sweats, and change in appetite. Eyes: Denies redness, blurred vision, diplopia, discharge, itchy, watery eyes.  ENT: Denies discharge, congestion, post nasal drip, epistaxis, sore  throat, earache, hearing loss, dental pain, Tinnitus, Vertigo, Sinus pain, snoring.  Cardio: Denies chest pain, palpitations, irregular heartbeat, syncope, dyspnea, diaphoresis, orthopnea, PND, claudication, edema Respiratory: denies cough, dyspnea, DOE, pleurisy, hoarseness, laryngitis, wheezing.  Gastrointestinal: Denies dysphagia, heartburn, reflux, water brash, pain, cramps, nausea, vomiting, bloating, diarrhea, constipation, hematemesis, melena, hematochezia, jaundice, hemorrhoids Genitourinary: Denies dysuria, frequency, urgency, nocturia, hesitancy, discharge, hematuria, flank pain Breast: Breast lumps, nipple discharge, bleeding.  Musculoskeletal: Denies arthralgia, myalgia, stiffness, Jt. Swelling, pain, limp, and strain/sprain. Denies falls/fractures. Skin: Denies puritis, rash, hives, warts, acne, eczema, changing in skin lesion Neuro: No weakness, tremor, incoordination, spasms, paresthesia, pain Psychiatric: Denies confusion, memory loss, sensory loss. Denies Depression/Mood changes. Endocrine: Denies change in weight, skin, hair change, nocturia, and paresthesia, diabetic polys, visual blurring, hyper / hypo glycemic episodes.  Heme/Lymph:  No excessive bleeding, bruising, enlarged lymph nodes  Objective:     BP 158/80 mmHg  Pulse 60  Temp(Src) 97.5 F (36.4 C)  Resp 16  Ht 5' 3.5" (1.613 m)  Wt 183 lb 3.2 oz (83.099 kg)  BMI 31.94 kg/m2  General Appearance: Well nourished, alert, WD/WN, female and in no apparent distress. Eyes: PERRLA, EOMs, conjunctiva no swelling or erythema, normal fundi and vessels. Sinuses: No frontal/maxillary tenderness ENT/Mouth: EACs patent / TMs  nl. Nares clear without erythema, swelling, mucoid exudates. Oral hygiene is good. No erythema, swelling, or exudate. Tongue normal, non-obstructing. Tonsils not swollen or erythematous. Hearing normal.  Neck: Supple, thyroid normal. No bruits, nodes or JVD. Respiratory: Respiratory effort normal.  BS  equal and clear bilateral without rales, rhonci, wheezing or stridor. Cardio: Heart sounds are normal with regular rate and rhythm and no murmurs, rubs or gallops. Peripheral pulses are normal and equal bilaterally without edema. No aortic or femoral bruits. Chest: symmetric with normal excursions and percussion. Breasts: Symmetric, without lumps, nipple discharge, retractions, or fibrocystic changes.  Abdomen: Flat, soft  with nl bowel sounds. Nontender, no guarding, rebound, hernias, masses, or organomegaly.  Lymphatics: Non tender without lymphadenopathy.  Genitourinary:  Musculoskeletal: Full ROM all peripheral extremities, joint stability, 5/5 strength, and normal gait. Skin: Warm and dry without rashes, lesions, cyanosis, clubbing or  ecchymosis.  Neuro: Cranial nerves intact, reflexes equal bilaterally. Normal muscle tone, no cerebellar symptoms. Sensation intact.  Pysch: Alert and oriented X 3, normal affect, Insight and Judgment appropriate.   Cognitive Testing  Alert? Yes  Normal Appearance?Yes  Oriented to person? Yes  Place? Yes   Time? Yes  Recall of three objects?  Yes  Can perform simple calculations? Yes  Displays appropriate judgment? Yes  Can read the correct time from a watch/clock?Yes  Medicare Attestation I have personally reviewed: The patient's medical and social history Their use of alcohol, tobacco or illicit drugs Their current medications and supplements The patient's functional ability including ADLs,fall risks, home safety risks, cognitive, and hearing and visual impairment Diet and physical activities Evidence for depression or mood disorders  The patient's weight, height, BMI, and visual acuity have been recorded in the chart.  I have made referrals, counseling, and provided education to the patient based on review of the above and I have provided the patient with a written personalized care plan for preventive services.  Over 40 minutes of exam,  counseling, chart review was performed.  Mikhaela Zaugg DAVID, MD   11/13/2015

## 2015-11-17 ENCOUNTER — Other Ambulatory Visit: Payer: Self-pay | Admitting: Internal Medicine

## 2015-11-21 ENCOUNTER — Other Ambulatory Visit: Payer: Self-pay

## 2015-11-21 DIAGNOSIS — Z1231 Encounter for screening mammogram for malignant neoplasm of breast: Secondary | ICD-10-CM

## 2015-11-22 DIAGNOSIS — I129 Hypertensive chronic kidney disease with stage 1 through stage 4 chronic kidney disease, or unspecified chronic kidney disease: Secondary | ICD-10-CM | POA: Diagnosis not present

## 2015-12-01 DIAGNOSIS — G5602 Carpal tunnel syndrome, left upper limb: Secondary | ICD-10-CM | POA: Diagnosis not present

## 2015-12-01 DIAGNOSIS — G5601 Carpal tunnel syndrome, right upper limb: Secondary | ICD-10-CM | POA: Diagnosis not present

## 2015-12-01 DIAGNOSIS — G5603 Carpal tunnel syndrome, bilateral upper limbs: Secondary | ICD-10-CM | POA: Diagnosis not present

## 2015-12-02 ENCOUNTER — Encounter: Payer: Self-pay | Admitting: *Deleted

## 2015-12-06 ENCOUNTER — Ambulatory Visit (INDEPENDENT_AMBULATORY_CARE_PROVIDER_SITE_OTHER): Payer: Medicare Other | Admitting: Internal Medicine

## 2015-12-06 ENCOUNTER — Encounter: Payer: Self-pay | Admitting: Internal Medicine

## 2015-12-06 VITALS — BP 156/80 | HR 68 | Temp 98.2°F | Resp 16 | Ht 63.5 in | Wt 183.0 lb

## 2015-12-06 DIAGNOSIS — J069 Acute upper respiratory infection, unspecified: Secondary | ICD-10-CM | POA: Diagnosis not present

## 2015-12-06 MED ORDER — PROMETHAZINE-DM 6.25-15 MG/5ML PO SYRP: ORAL_SOLUTION | ORAL | Status: DC

## 2015-12-06 MED ORDER — PREDNISONE 20 MG PO TABS: ORAL_TABLET | ORAL | Status: DC

## 2015-12-06 NOTE — Patient Instructions (Signed)
Please take the prednisone as prescribed until it is gone.  Please rinse your nose with saline as often as tolerated.  Please take cough syrup as needed for coughing.  Please drink plenty of fluids.  Please take claritin, zyrtec, or allegra daily to help dry up congestion.  Store brand and generic are fine.  Get what is on sale.  Please call office if breathing gets worse or you have new symptoms including fever.

## 2015-12-06 NOTE — Progress Notes (Signed)
Patient ID: Kelly Cantu, female   DOB: 1941-10-11, 75 y.o.   MRN: [id number]  HPI  Patient presents to the office for evaluation of cough.  It has been going on for 3 days.  Patient reports night > day, dry, worse with lying down.  They also endorse change in voice, postnasal drip, shortness of breath, wheezing and nasal congestion, sinus pressure, scratchy throat.  . Clear nasal sputum. They have tried tylenol.  They report that nothing has worked.  They admits to other sick contacts.  Review of Systems  Constitutional: Positive for chills and malaise/fatigue. Negative for fever.  HENT: Positive for congestion and sore throat. Negative for ear pain.   Respiratory: Positive for cough, shortness of breath and wheezing. Negative for sputum production.   Cardiovascular: Negative for chest pain, palpitations and leg swelling.  Neurological: Negative for headaches.    PE:  Filed Vitals:   12/06/15 1124  BP: 156/80  Pulse: 68  Temp: 98.2 F (36.8 C)  Resp: 16    General:  Alert and non-toxic, WDWN, NAD HEENT: NCAT, PERLA, EOM normal, no occular discharge or erythema.  Nasal mucosal edema with sinus tenderness to palpation.  Oropharynx clear with minimal oropharyngeal edema and erythema.  Mucous membranes moist and pink. Neck:  Cervical adenopathy Chest:  RRR no MRGs.  Lungs clear to auscultation A&P with no wheezes rhonchi or rales.   Abdomen: +BS x 4 quadrants, soft, non-tender, no guarding, rigidity, or rebound. Skin: warm and dry no rash Neuro: A&Ox4, CN II-XII grossly intact  Assessment and Plan:   1. Acute URI -prednisone -phenergan dextromethorphan -claritin -nasal saline

## 2015-12-21 ENCOUNTER — Ambulatory Visit
Admission: RE | Admit: 2015-12-21 | Discharge: 2015-12-21 | Disposition: A | Discharge status: Home / Self Care | Payer: Medicare Other | Source: Ambulatory Visit

## 2015-12-21 DIAGNOSIS — Z1231 Encounter for screening mammogram for malignant neoplasm of breast: Secondary | ICD-10-CM | POA: Diagnosis not present

## 2016-01-16 ENCOUNTER — Other Ambulatory Visit: Payer: Self-pay | Admitting: *Deleted

## 2016-01-16 DIAGNOSIS — Z1212 Encounter for screening for malignant neoplasm of rectum: Secondary | ICD-10-CM

## 2016-01-16 LAB — POC HEMOCCULT BLD/STL (HOME/3-CARD/SCREEN)
Card #2 Fecal Occult Blod, POC: NEGATIVE
Card #3 Fecal Occult Blood, POC: NEGATIVE
Fecal Occult Blood, POC: NEGATIVE

## 2016-01-17 ENCOUNTER — Other Ambulatory Visit: Payer: Self-pay | Admitting: Internal Medicine

## 2016-02-06 DIAGNOSIS — K219 Gastro-esophageal reflux disease without esophagitis: Secondary | ICD-10-CM | POA: Diagnosis not present

## 2016-02-06 DIAGNOSIS — I129 Hypertensive chronic kidney disease with stage 1 through stage 4 chronic kidney disease, or unspecified chronic kidney disease: Secondary | ICD-10-CM | POA: Diagnosis not present

## 2016-02-06 DIAGNOSIS — M10379 Gout due to renal impairment, unspecified ankle and foot: Secondary | ICD-10-CM | POA: Diagnosis not present

## 2016-02-06 DIAGNOSIS — M1990 Unspecified osteoarthritis, unspecified site: Secondary | ICD-10-CM | POA: Diagnosis not present

## 2016-02-06 DIAGNOSIS — N2581 Secondary hyperparathyroidism of renal origin: Secondary | ICD-10-CM | POA: Diagnosis not present

## 2016-02-06 DIAGNOSIS — E782 Mixed hyperlipidemia: Secondary | ICD-10-CM | POA: Diagnosis not present

## 2016-02-06 DIAGNOSIS — D631 Anemia in chronic kidney disease: Secondary | ICD-10-CM | POA: Diagnosis not present

## 2016-02-06 DIAGNOSIS — N183 Chronic kidney disease, stage 3 (moderate): Secondary | ICD-10-CM | POA: Diagnosis not present

## 2016-02-13 ENCOUNTER — Encounter: Payer: Self-pay | Admitting: *Deleted

## 2016-02-13 DIAGNOSIS — G5603 Carpal tunnel syndrome, bilateral upper limbs: Secondary | ICD-10-CM | POA: Diagnosis not present

## 2016-02-20 ENCOUNTER — Encounter: Payer: Self-pay | Admitting: Internal Medicine

## 2016-02-20 ENCOUNTER — Ambulatory Visit (INDEPENDENT_AMBULATORY_CARE_PROVIDER_SITE_OTHER): Payer: Medicare Other | Admitting: Internal Medicine

## 2016-02-20 VITALS — BP 124/70 | HR 58 | Temp 98.0°F | Resp 16 | Ht 63.5 in | Wt 182.0 lb

## 2016-02-20 DIAGNOSIS — E559 Vitamin D deficiency, unspecified: Secondary | ICD-10-CM

## 2016-02-20 DIAGNOSIS — I10 Essential (primary) hypertension: Secondary | ICD-10-CM | POA: Diagnosis not present

## 2016-02-20 DIAGNOSIS — E785 Hyperlipidemia, unspecified: Secondary | ICD-10-CM | POA: Diagnosis not present

## 2016-02-20 DIAGNOSIS — R7303 Prediabetes: Secondary | ICD-10-CM

## 2016-02-20 DIAGNOSIS — R7309 Other abnormal glucose: Secondary | ICD-10-CM | POA: Diagnosis not present

## 2016-02-20 DIAGNOSIS — Z79899 Other long term (current) drug therapy: Secondary | ICD-10-CM

## 2016-02-20 MED ORDER — RANITIDINE HCL 300 MG PO TABS: ORAL_TABLET | ORAL | Status: DC

## 2016-02-20 NOTE — Progress Notes (Signed)
Assessment and Plan:  Hypertension:  -Continue medication,  -monitor blood pressure at home.  -Continue DASH diet.   -Reminder to go to the ER if any CP, SOB, nausea, dizziness, severe HA, changes vision/speech, left arm numbness and tingling, and jaw pain.  Cholesterol: -Continue diet and exercise.  -Check cholesterol.   Pre-diabetes: -Continue diet and exercise.  -Check A1C  Vitamin D Def: -check level -continue medications.   Seborrheic Dermatitis -baby shampoo  Continue diet and meds as discussed. Further disposition pending results of labs.  HPI 75 y.o. female  presents for 3 month follow up with hypertension, hyperlipidemia, prediabetes and vitamin D.   Her blood pressure has been controlled at home, today their BP is BP: 124/70 mmHg.   She does workout. She denies chest pain, shortness of breath, dizziness.   She is on cholesterol medication and denies myalgias. Her cholesterol is at goal. The cholesterol last visit was:   Lab Results  Component Value Date   CHOL 155 11/11/2015   HDL 45* 11/11/2015   LDLCALC 74 11/11/2015   TRIG 178* 11/11/2015   CHOLHDL 3.4 11/11/2015     She has been working on diet and exercise for prediabetes, and denies foot ulcerations, hyperglycemia, hypoglycemia , increased appetite, nausea, paresthesia of the feet, polydipsia, polyuria, visual disturbances, vomiting and weight loss. Last A1C in the office was:  Lab Results  Component Value Date   HGBA1C 6.2* 11/11/2015    Patient is on Vitamin D supplement.  Lab Results  Component Value Date   VD25OH 46 11/11/2015     She has been having some redness around the eye lashes, lips and also the hairlines.  She reports that they have been intermittently bothersome.  It sometimes becomes crusty and dry.  She reports that the eyes slightly crusted closed but not stuck together.    Current Medications:  Current Outpatient Prescriptions on File Prior to Visit  Medication Sig Dispense  Refill  . allopurinol (ZYLOPRIM) 300 MG tablet TAKE 1 TABLET BY MOUTH EVERY DAY 90 tablet 99  . amLODipine (NORVASC) 5 MG tablet TAKE 1 TABLET EVERYDAY AT BEDTIME  6  . atenolol (TENORMIN) 50 MG tablet TAKE 1 TABLET DAILY FOR BLOOD PRESSURE 90 tablet 4  . atorvastatin (LIPITOR) 20 MG tablet TAKE 1  TABLET DAILY AS DIRECTED  FOR CHOLESTEROL 90 tablet 99  . BABY ASPIRIN PO Take 81 mg by mouth daily.    . calcitRIOL (ROCALTROL) 0.25 MCG capsule Take 0.25 mcg by mouth daily.    . furosemide (LASIX) 40 MG tablet TAKE 1 TABLET DAILY FOR BLOOD PRESSURE AND FLUID 90 tablet 3  . Multiple Vitamins-Minerals (MULTIVITAMIN PO) Take by mouth daily.    . Omega-3 Fatty Acids (FISH OIL PO) Take by mouth daily.     No current facility-administered medications on file prior to visit.    Medical History:  Past Medical History  Diagnosis Date  . Hyperlipidemia   . Hypertension   . Prediabetes   . GERD (gastroesophageal reflux disease)   . DJD (degenerative joint disease)   . DDD (degenerative disc disease), lumbar   . IBS (irritable bowel syndrome)   . Cataract   . Chronic kidney disease     Allergies:  Allergies  Allergen Reactions  . Ace Inhibitors     Pt unsure of reaction  . Feldene [Piroxicam] Rash     Review of Systems:  Review of Systems  Constitutional: Negative for fever, chills and malaise/fatigue.  HENT: Positive for  congestion. Negative for ear pain and sore throat.   Eyes: Negative.   Respiratory: Negative for cough, shortness of breath and wheezing.   Cardiovascular: Negative for chest pain, palpitations and leg swelling.  Gastrointestinal: Negative for heartburn, diarrhea, constipation, blood in stool and melena.  Genitourinary: Negative.   Neurological: Negative for dizziness, loss of consciousness and headaches.  Psychiatric/Behavioral: Negative for depression. The patient is not nervous/anxious and does not have insomnia.     Family history- Review and  unchanged  Social history- Review and unchanged  Physical Exam: BP 124/70 mmHg  Pulse 58  Temp(Src) 98 F (36.7 C) (Temporal)  Resp 16  Ht 5' 3.5" (1.613 m)  Wt 182 lb (82.555 kg)  BMI 31.73 kg/m2 Wt Readings from Last 3 Encounters:  02/20/16 182 lb (82.555 kg)  12/06/15 183 lb (83.008 kg)  11/11/15 183 lb 3.2 oz (83.099 kg)    General Appearance: Well nourished well developed, in no apparent distress. Eyes: PERRLA, EOMs, conjunctiva no swelling or erythema ENT/Mouth: Ear canals normal without obstruction, swelling, erythma, discharge.  TMs normal bilaterally.  Oropharynx moist, clear, without exudate, or postoropharyngeal swelling. Neck: Supple, thyroid normal,no cervical adenopathy  Respiratory: Respiratory effort normal, Breath sounds clear A&P without rhonchi, wheeze, or rale.  No retractions, no accessory usage. Cardio: RRR with no MRGs. Brisk peripheral pulses without edema.  Abdomen: Soft, + BS,  Non tender, no guarding, rebound, hernias, masses. Musculoskeletal: Full ROM, 5/5 strength, Normal gait Skin: Warm, dry without rashes, lesions, ecchymosis.  Neuro: Awake and oriented X 3, Cranial nerves intact. Normal muscle tone, no cerebellar symptoms. Psych: Normal affect, Insight and Judgment appropriate.    [name], PA-C 2:42 PM [facility]

## 2016-02-20 NOTE — Patient Instructions (Signed)
[place]  Seborrheic Dermatitis Seborrheic dermatitis involves pink or red skin with greasy, flaky scales. It usually occurs on the scalp, and it is often called dandruff. This condition may also affect the eyebrows, nose, ears, chest, and the bearded area of men's faces. It often occurs where skin has more oil (sebaceous) glands. It may come and go for no known reason, and it is often long-lasting (chronic). CAUSES The cause is not known. RISK FACTORS This condition is more like to develop in:  People who are stressed or tired.  People who have skin conditions, such as acne.  People who have certain conditions, such as:  HIV (human immunodeficiency virus).  AIDS (acquired immunodeficiency syndrome).  Parkinson disease.  An eating disorder.  Stroke.  Depression.  Epilepsy.  Alcoholism.  People who live in places that have extreme weather.  People who have a family history of seborrheic dermatitis.  People who use skin creams that are made with alcohol.  People who are 71-20 years old.  People who take certain medicines. SYMPTOMS Symptoms of this condition include:  Thick scales on the scalp.  Redness on the face or in the armpits.  Skin that is flaky. The flakes may be white or yellow.  Skin that seems oily or dry but is not helped with moisturizers.  Itching or burning in the affected areas. DIAGNOSIS This condition is diagnosed with a medical history and physical exam. A sample of your skin may be tested (skin biopsy). You may need to see a skin specialist (dermatologist). TREATMENT There is no cure for this condition, but treatment can help to manage the symptoms. Treatment may include:  Cortisone (steroid) ointments, creams, and lotions.  Over-the-counter or prescription shampoos. HOME CARE INSTRUCTIONS  Apply over-the-counter and prescription medicines only as told by your health care provider.  Keep all follow-up visits as told by your health care  provider. This is important.  Try to reduce your stress, such as with yoga or mediation. If you need help to reduce stress, ask your health care provider.  Shower or bathe as told by your health care provider.  Use any medicated shampoos as told by your health care provider. SEEK MEDICAL CARE IF:  Your symptoms do not improve with treatment.  Your symptoms get worse.  You have new symptoms.   This information is not intended to replace advice given to you by your health care provider. Make sure you discuss any questions you have with your health care provider.   Document Released: 11/19/2005 Document Revised: 08/10/2015 Document Reviewed: 04/06/2015 Elsevier Interactive Patient Education [employer].

## 2016-02-21 LAB — CBC WITH DIFFERENTIAL/PLATELET
BASOS ABS: 0 10*3/uL (ref 0.0–0.1)
Basophils Relative: 0 % (ref 0–1)
Eosinophils Absolute: 0.2 10*3/uL (ref 0.0–0.7)
Eosinophils Relative: 2 % (ref 0–5)
HEMATOCRIT: 37.9 % (ref 36.0–46.0)
Hemoglobin: 12.9 g/dL (ref 12.0–15.0)
LYMPHS ABS: 2 10*3/uL (ref 0.7–4.0)
LYMPHS PCT: 22 % (ref 12–46)
MCH: 30.3 pg (ref 26.0–34.0)
MCHC: 34 g/dL (ref 30.0–36.0)
MCV: 89 fL (ref 78.0–100.0)
MPV: 9.9 fL (ref 8.6–12.4)
Monocytes Absolute: 0.7 10*3/uL (ref 0.1–1.0)
Monocytes Relative: 8 % (ref 3–12)
NEUTROS ABS: 6.2 10*3/uL (ref 1.7–7.7)
NEUTROS PCT: 68 % (ref 43–77)
Platelets: 282 10*3/uL (ref 150–400)
RBC: 4.26 MIL/uL (ref 3.87–5.11)
RDW: 15.4 % (ref 11.5–15.5)
WBC: 9.1 10*3/uL (ref 4.0–10.5)

## 2016-02-21 LAB — HEPATIC FUNCTION PANEL
ALBUMIN: 4.4 g/dL (ref 3.6–5.1)
ALK PHOS: 98 U/L (ref 33–130)
ALT: 22 U/L (ref 6–29)
AST: 25 U/L (ref 10–35)
BILIRUBIN INDIRECT: 0.7 mg/dL (ref 0.2–1.2)
BILIRUBIN TOTAL: 0.9 mg/dL (ref 0.2–1.2)
Bilirubin, Direct: 0.2 mg/dL (ref <=0.2)
TOTAL PROTEIN: 6.8 g/dL (ref 6.1–8.1)

## 2016-02-21 LAB — TSH: TSH: 1.84 mIU/L

## 2016-02-21 LAB — BASIC METABOLIC PANEL WITH GFR
BUN: 44 mg/dL — AB (ref 7–25)
CHLORIDE: 96 mmol/L — AB (ref 98–110)
CO2: 30 mmol/L (ref 20–31)
Calcium: 10.4 mg/dL (ref 8.6–10.4)
Creat: 1.78 mg/dL — ABNORMAL HIGH (ref 0.60–0.93)
GFR, EST AFRICAN AMERICAN: 32 mL/min — AB (ref >=60)
GFR, EST NON AFRICAN AMERICAN: 28 mL/min — AB (ref >=60)
GLUCOSE: 112 mg/dL — AB (ref 65–99)
POTASSIUM: 4.5 mmol/L (ref 3.5–5.3)
Sodium: 139 mmol/L (ref 135–146)

## 2016-02-21 LAB — LIPID PANEL
CHOL/HDL RATIO: 4.1 ratio (ref <=5.0)
CHOLESTEROL: 183 mg/dL (ref 125–200)
HDL: 45 mg/dL — AB (ref >=46)
LDL Cholesterol: 90 mg/dL (ref <130)
TRIGLYCERIDES: 241 mg/dL — AB (ref <150)
VLDL: 48 mg/dL — ABNORMAL HIGH (ref <30)

## 2016-02-21 LAB — HEMOGLOBIN A1C
Hgb A1c MFr Bld: 6.6 % — ABNORMAL HIGH (ref <5.7)
Mean Plasma Glucose: 143 mg/dL — ABNORMAL HIGH (ref <117)

## 2016-03-08 DIAGNOSIS — I129 Hypertensive chronic kidney disease with stage 1 through stage 4 chronic kidney disease, or unspecified chronic kidney disease: Secondary | ICD-10-CM | POA: Diagnosis not present

## 2016-03-10 ENCOUNTER — Other Ambulatory Visit: Payer: Self-pay | Admitting: Physician Assistant

## 2016-05-02 ENCOUNTER — Other Ambulatory Visit: Payer: Self-pay | Admitting: Internal Medicine

## 2016-05-15 ENCOUNTER — Other Ambulatory Visit: Payer: Self-pay | Admitting: Internal Medicine

## 2016-05-28 ENCOUNTER — Other Ambulatory Visit: Payer: Self-pay | Admitting: Internal Medicine

## 2016-05-28 ENCOUNTER — Encounter: Payer: Self-pay | Admitting: Internal Medicine

## 2016-05-28 ENCOUNTER — Ambulatory Visit (INDEPENDENT_AMBULATORY_CARE_PROVIDER_SITE_OTHER): Payer: Medicare Other | Admitting: Internal Medicine

## 2016-05-28 VITALS — BP 128/82 | HR 64 | Temp 97.3°F | Resp 16 | Ht 63.5 in | Wt 185.0 lb

## 2016-05-28 DIAGNOSIS — E559 Vitamin D deficiency, unspecified: Secondary | ICD-10-CM

## 2016-05-28 DIAGNOSIS — N184 Chronic kidney disease, stage 4 (severe): Secondary | ICD-10-CM | POA: Diagnosis not present

## 2016-05-28 DIAGNOSIS — Z79899 Other long term (current) drug therapy: Secondary | ICD-10-CM | POA: Diagnosis not present

## 2016-05-28 DIAGNOSIS — E1122 Type 2 diabetes mellitus with diabetic chronic kidney disease: Secondary | ICD-10-CM | POA: Diagnosis not present

## 2016-05-28 DIAGNOSIS — R7309 Other abnormal glucose: Secondary | ICD-10-CM

## 2016-05-28 DIAGNOSIS — I10 Essential (primary) hypertension: Secondary | ICD-10-CM

## 2016-05-28 DIAGNOSIS — M1000 Idiopathic gout, unspecified site: Secondary | ICD-10-CM | POA: Diagnosis not present

## 2016-05-28 DIAGNOSIS — E785 Hyperlipidemia, unspecified: Secondary | ICD-10-CM | POA: Diagnosis not present

## 2016-05-28 DIAGNOSIS — R7303 Prediabetes: Secondary | ICD-10-CM

## 2016-05-28 LAB — CBC WITH DIFFERENTIAL/PLATELET
Basophils Absolute: 78 cells/uL (ref 0–200)
Basophils Relative: 1 %
EOS PCT: 2 %
Eosinophils Absolute: 156 cells/uL (ref 15–500)
HCT: 35.2 % (ref 35.0–45.0)
HEMOGLOBIN: 11.9 g/dL (ref 11.7–15.5)
LYMPHS ABS: 1716 {cells}/uL (ref 850–3900)
Lymphocytes Relative: 22 %
MCH: 30.4 pg (ref 27.0–33.0)
MCHC: 33.8 g/dL (ref 32.0–36.0)
MCV: 90 fL (ref 80.0–100.0)
MPV: 9.4 fL (ref 7.5–12.5)
Monocytes Absolute: 780 cells/uL (ref 200–950)
Monocytes Relative: 10 %
NEUTROS ABS: 5070 {cells}/uL (ref 1500–7800)
Neutrophils Relative %: 65 %
PLATELETS: 252 10*3/uL (ref 140–400)
RBC: 3.91 MIL/uL (ref 3.80–5.10)
RDW: 15.5 % — ABNORMAL HIGH (ref 11.0–15.0)
WBC: 7.8 10*3/uL (ref 3.8–10.8)

## 2016-05-28 NOTE — Progress Notes (Signed)
Patient ID: Kelly Cantu, female   DOB: 1941-09-22, 75 y.o.   MRN: [id number]  [facility]                       Unk Pinto, M.D.        [name]. [name], P.A.-C       [name], P.A.-C   [facility]                [address] [place], [place] [id number] Telephone [phone] Telefax [phone] ______________________________________________________________________     This very nice 75 y.o. 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      Patient is treated for HTN predating since 1991 & BP has been controlled at home. Today's BP: 128/82 mmHg. Patient has had no complaints of any cardiac type chest pain, palpitations, dyspnea/orthopnea/PND, dizziness, claudication, or dependent edema.     Hyperlipidemia is controlled with diet & meds. Patient denies myalgias or other med SE's. Last Lipids were at goal with Cholesterol 183; HDL 45*; LDL Cholesterol 90; but elevated Triglycerides 241 on 02/20/2016.     Also, the patient has history of Morbid Obesity  (BMI 32+) and consequent PreDiabetes with A1c 6.0% in 2014 and she has had no symptoms of reactive hypoglycemia, diabetic polys, paresthesias or visual blurring.  Last A1c was not at goal with A1c  6.6% now in the T2_DM range on 02/20/2016 Patient also has hx/o multifactorial CKD 4  (GFR 28 ml/min) attributed to HTN, NSAID's and also underlying T2_DM. Patient is also followed by Dr Deterding.  .      Further, the patient also has history of Vitamin D Deficiency  Of "43" in 2008 and supplements vitamin D without any suspected side-effects. Last vitamin D was  46 on 11/11/2015.  Medication Sig  . allopurinol  300 MG tablet TAKE 1 TABLET BY MOUTH EVERY DAY  . amLODipine  5 MG tablet TAKE 1 TABLET EVERYDAY AT BEDTIME  . atenolol  50 MG tablet TAKE 1 TABLET DAILY  FOR BLOOD PRESSURE  . atorvastatin  20 MG tablet TAKE 1 TABLET DAILY AS DIRECTED FOR CHOLESTEROL  . BABY ASPIRIN Take 81 mg by mouth daily.  . calcitRIOL  0.25 MCG  Take 0.25 mcg by mouth daily.  . furosemide  40 MG tablet TAKE 1 TABLET DAILY FOR BLOOD PRESSURE AND FLUID  . Multiple Vitamins-Minerals  Take by mouth daily.  . Omega-3  FISH OIL  Take by mouth daily.  . ranitidine  300 MG tablet TAKE 1 TO 2 TABLETS DAILY FOR ACID INDIGESTION AND REFLUX TO ALLOW TRANSITION OFF NEXIUM  . NEXIUM 40 MG capsule TAKE 1 CAPSULE DAILY FOR ACID INDIGESTION   Allergies  Allergen Reactions  . Ace Inhibitors     Pt unsure of reaction  . Feldene [Piroxicam] Rash   PMHx:   Past Medical History  Diagnosis Date  . Hyperlipidemia   . Hypertension   . Prediabetes   . GERD (gastroesophageal reflux disease)   . DJD (degenerative joint disease)   . DDD (degenerative disc disease), lumbar   . IBS (irritable bowel syndrome)   . Cataract   . Chronic kidney disease    Immunization History  Administered Date(s) Administered  . DT 09/29/2014, 09/20/2015  .  Influenza Split 09/28/2013  . Influenza, High Dose Seasonal PF 10/13/2014  . Influenza-Unspecified 08/04/2015  . Pneumococcal Conjugate-13 10/13/2014  . Pneumococcal Polysaccharide-23 09/23/2012  . Td 09/02/2004  . Zoster 10/13/2013   Past Surgical History  Procedure Laterality Date  . Abdominal hysterectomy    . Cataract extraction    . Colonoscopy  x 2    w/Dr Carlean Purl   FHx:    Reviewed / unchanged  SHx:    Reviewed / unchanged  Systems Review:  Constitutional: Denies fever, chills, wt changes, headaches, insomnia, fatigue, night sweats, change in appetite. Eyes: Denies redness, blurred vision, diplopia, discharge, itchy, watery eyes.  ENT: Denies discharge, congestion, post nasal drip, epistaxis, sore throat, earache, hearing loss, dental pain, tinnitus, vertigo, sinus pain, snoring.  CV: Denies chest pain, palpitations, irregular  heartbeat, syncope, dyspnea, diaphoresis, orthopnea, PND, claudication or edema. Respiratory: denies cough, dyspnea, DOE, pleurisy, hoarseness, laryngitis, wheezing.  Gastrointestinal: Denies dysphagia, odynophagia, heartburn, reflux, water brash, abdominal pain or cramps, nausea, vomiting, bloating, diarrhea, constipation, hematemesis, melena, hematochezia  or hemorrhoids. Genitourinary: Denies dysuria, frequency, urgency, nocturia, hesitancy, discharge, hematuria or flank pain. Musculoskeletal: Denies arthralgias, myalgias, stiffness, jt. swelling, pain, limping or strain/sprain.  Skin: Denies pruritus, rash, hives, warts, acne, eczema or change in skin lesion(s). Neuro: No weakness, tremor, incoordination, spasms, paresthesia or pain. Psychiatric: Denies confusion, memory loss or sensory loss. Endo: Denies change in weight, skin or hair change.  Heme/Lymph: No excessive bleeding, bruising or enlarged lymph nodes.  Physical Exam  BP 128/82 mmHg  Pulse 64  Temp(Src) 97.3 F (36.3 C)  Resp 16  Ht 5' 3.5" (1.613 m)  Wt 185 lb (83.915 kg)  BMI 32.25 kg/m2  Appears over nourished and in no distress. Eyes: PERRLA, EOMs, conjunctiva no swelling or erythema. Sinuses: No frontal/maxillary tenderness ENT/Mouth: EAC's clear, TM's nl w/o erythema, bulging. Nares clear w/o erythema, swelling, exudates. Oropharynx clear without erythema or exudates. Oral hygiene is good. Tongue normal, non obstructing. Hearing intact.  Neck: Supple. Thyroid nl. Car 2+/2+ without bruits, nodes or JVD. Chest: Respirations nl with BS clear & equal w/o rales, rhonchi, wheezing or stridor.  Cor: Heart sounds normal w/ regular rate and rhythm without sig. murmurs, gallops, clicks, or rubs. Peripheral pulses normal and equal  without edema.  Abdomen: Soft & bowel sounds normal. Non-tender w/o guarding, rebound, hernias, masses, or organomegaly.  Lymphatics: Unremarkable.  Musculoskeletal: Full ROM all peripheral  extremities, joint stability, 5/5 strength, and normal gait.  Skin: Warm, dry without exposed rashes, lesions or ecchymosis apparent.  Neuro: Cranial nerves intact, reflexes equal bilaterally. Sensory-motor testing grossly intact. Tendon reflexes grossly intact.  Pysch: Alert & oriented x 3.  Insight and judgement nl & appropriate. No ideations.  Assessment and Plan:  1. Essential hypertension  - Continue monitor blood pressure at home. Continue diet/meds same. - TSH  2. Hyperlipidemia  - Continue diet/meds, exercise,& lifestyle modifications. Continue monitor periodic cholesterol/liver & renal functions   - Lipid panel - TSH  3. Other abnormal glucose  - Continue diet, exercise, lifestyle modifications. Monitor appropriate labs. - Hemoglobin A1c - Insulin, random  4. Vitamin D deficiency  - Continue supplementation. - VITAMIN D 25 Hydroxy   5. Prediabetes  - Hemoglobin A1c - Insulin, random  6. Idiopathic gout  - Uric acid  7. CKD (chronic kidney disease) stage 3, GFR 30-59 ml/min   8. Medication management  - CBC with Differential/Platelet - BASIC METABOLIC PANEL WITH GFR - Hepatic function panel - Magnesium  Recommended regular exercise, BP monitoring, weight control, and discussed med and SE's. Recommended labs to assess and monitor clinical status. Further disposition pending results of labs. Over 30 minutes of exam, counseling, chart review was performed

## 2016-05-28 NOTE — Patient Instructions (Signed)

## 2016-05-29 LAB — HEPATIC FUNCTION PANEL
ALBUMIN: 4.2 g/dL (ref 3.6–5.1)
ALK PHOS: 89 U/L (ref 33–130)
ALT: 27 U/L (ref 6–29)
AST: 32 U/L (ref 10–35)
BILIRUBIN TOTAL: 1 mg/dL (ref 0.2–1.2)
Bilirubin, Direct: 0.2 mg/dL (ref <=0.2)
Indirect Bilirubin: 0.8 mg/dL (ref 0.2–1.2)
TOTAL PROTEIN: 6.4 g/dL (ref 6.1–8.1)

## 2016-05-29 LAB — LIPID PANEL
Cholesterol: 173 mg/dL (ref 125–200)
HDL: 43 mg/dL — AB (ref >=46)
LDL CALC: 88 mg/dL (ref <130)
Total CHOL/HDL Ratio: 4 Ratio (ref <=5.0)
Triglycerides: 208 mg/dL — ABNORMAL HIGH (ref <150)
VLDL: 42 mg/dL — ABNORMAL HIGH (ref <30)

## 2016-05-29 LAB — URIC ACID: URIC ACID, SERUM: 5.7 mg/dL (ref 2.5–7.0)

## 2016-05-29 LAB — INSULIN, RANDOM: INSULIN: 14.9 u[IU]/mL (ref 2.0–19.6)

## 2016-05-29 LAB — TSH: TSH: 1.5 m[IU]/L

## 2016-05-29 LAB — MAGNESIUM: MAGNESIUM: 2 mg/dL (ref 1.5–2.5)

## 2016-05-29 LAB — HEMOGLOBIN A1C
Hgb A1c MFr Bld: 6.5 % — ABNORMAL HIGH (ref <5.7)
MEAN PLASMA GLUCOSE: 140 mg/dL

## 2016-05-29 LAB — VITAMIN D 25 HYDROXY (VIT D DEFICIENCY, FRACTURES): Vit D, 25-Hydroxy: 44 ng/mL (ref 30–100)

## 2016-05-30 LAB — BASIC METABOLIC PANEL WITH GFR
BUN: 37 mg/dL — AB (ref 7–25)
CALCIUM: 10.4 mg/dL (ref 8.6–10.4)
CHLORIDE: 96 mmol/L — AB (ref 98–110)
CO2: 28 mmol/L (ref 20–31)
CREATININE: 1.98 mg/dL — AB (ref 0.60–0.93)
GFR, Est African American: 28 mL/min — ABNORMAL LOW (ref >=60)
GFR, Est Non African American: 25 mL/min — ABNORMAL LOW (ref >=60)
Glucose, Bld: 94 mg/dL (ref 65–99)
Potassium: 3.8 mmol/L (ref 3.5–5.3)
Sodium: 135 mmol/L (ref 135–146)

## 2016-07-16 DIAGNOSIS — H43812 Vitreous degeneration, left eye: Secondary | ICD-10-CM | POA: Diagnosis not present

## 2016-08-08 DIAGNOSIS — H43812 Vitreous degeneration, left eye: Secondary | ICD-10-CM | POA: Diagnosis not present

## 2016-08-13 DIAGNOSIS — N2581 Secondary hyperparathyroidism of renal origin: Secondary | ICD-10-CM | POA: Diagnosis not present

## 2016-08-13 DIAGNOSIS — D631 Anemia in chronic kidney disease: Secondary | ICD-10-CM | POA: Diagnosis not present

## 2016-08-13 DIAGNOSIS — E669 Obesity, unspecified: Secondary | ICD-10-CM | POA: Diagnosis not present

## 2016-08-13 DIAGNOSIS — I129 Hypertensive chronic kidney disease with stage 1 through stage 4 chronic kidney disease, or unspecified chronic kidney disease: Secondary | ICD-10-CM | POA: Diagnosis not present

## 2016-08-13 DIAGNOSIS — E782 Mixed hyperlipidemia: Secondary | ICD-10-CM | POA: Diagnosis not present

## 2016-08-13 DIAGNOSIS — N183 Chronic kidney disease, stage 3 (moderate): Secondary | ICD-10-CM | POA: Diagnosis not present

## 2016-08-13 DIAGNOSIS — K219 Gastro-esophageal reflux disease without esophagitis: Secondary | ICD-10-CM | POA: Diagnosis not present

## 2016-08-13 DIAGNOSIS — M1990 Unspecified osteoarthritis, unspecified site: Secondary | ICD-10-CM | POA: Diagnosis not present

## 2016-08-13 DIAGNOSIS — M10379 Gout due to renal impairment, unspecified ankle and foot: Secondary | ICD-10-CM | POA: Diagnosis not present

## 2016-09-03 ENCOUNTER — Ambulatory Visit (INDEPENDENT_AMBULATORY_CARE_PROVIDER_SITE_OTHER): Payer: Medicare Other | Admitting: Physician Assistant

## 2016-09-03 ENCOUNTER — Encounter: Payer: Self-pay | Admitting: Physician Assistant

## 2016-09-03 VITALS — BP 118/70 | HR 86 | Temp 97.6°F | Resp 16 | Ht 63.5 in | Wt 180.6 lb

## 2016-09-03 DIAGNOSIS — E1122 Type 2 diabetes mellitus with diabetic chronic kidney disease: Secondary | ICD-10-CM

## 2016-09-03 DIAGNOSIS — N184 Chronic kidney disease, stage 4 (severe): Secondary | ICD-10-CM

## 2016-09-03 DIAGNOSIS — E785 Hyperlipidemia, unspecified: Secondary | ICD-10-CM

## 2016-09-03 DIAGNOSIS — Z23 Encounter for immunization: Secondary | ICD-10-CM

## 2016-09-03 DIAGNOSIS — I10 Essential (primary) hypertension: Secondary | ICD-10-CM

## 2016-09-03 LAB — CBC WITH DIFFERENTIAL/PLATELET
BASOS PCT: 0 %
Basophils Absolute: 0 cells/uL (ref 0–200)
EOS PCT: 3 %
Eosinophils Absolute: 261 cells/uL (ref 15–500)
HEMATOCRIT: 35.4 % (ref 35.0–45.0)
HEMOGLOBIN: 12.1 g/dL (ref 11.7–15.5)
LYMPHS ABS: 2088 {cells}/uL (ref 850–3900)
Lymphocytes Relative: 24 %
MCH: 30.5 pg (ref 27.0–33.0)
MCHC: 34.2 g/dL (ref 32.0–36.0)
MCV: 89.2 fL (ref 80.0–100.0)
MONO ABS: 783 {cells}/uL (ref 200–950)
MPV: 10.5 fL (ref 7.5–12.5)
Monocytes Relative: 9 %
NEUTROS ABS: 5568 {cells}/uL (ref 1500–7800)
NEUTROS PCT: 64 %
Platelets: 271 10*3/uL (ref 140–400)
RBC: 3.97 MIL/uL (ref 3.80–5.10)
RDW: 15 % (ref 11.0–15.0)
WBC: 8.7 10*3/uL (ref 3.8–10.8)

## 2016-09-03 LAB — BASIC METABOLIC PANEL WITH GFR
BUN: 36 mg/dL — ABNORMAL HIGH (ref 7–25)
CALCIUM: 11 mg/dL — AB (ref 8.6–10.4)
CHLORIDE: 99 mmol/L (ref 98–110)
CO2: 29 mmol/L (ref 20–31)
CREATININE: 1.93 mg/dL — AB (ref 0.60–0.93)
GFR, EST NON AFRICAN AMERICAN: 25 mL/min — AB (ref >=60)
GFR, Est African American: 29 mL/min — ABNORMAL LOW (ref >=60)
GLUCOSE: 107 mg/dL — AB (ref 65–99)
POTASSIUM: 4.3 mmol/L (ref 3.5–5.3)
SODIUM: 138 mmol/L (ref 135–146)

## 2016-09-03 LAB — HEPATIC FUNCTION PANEL
ALT: 17 U/L (ref 6–29)
AST: 22 U/L (ref 10–35)
Albumin: 4.2 g/dL (ref 3.6–5.1)
Alkaline Phosphatase: 96 U/L (ref 33–130)
BILIRUBIN DIRECT: 0.2 mg/dL (ref <=0.2)
BILIRUBIN TOTAL: 0.9 mg/dL (ref 0.2–1.2)
Indirect Bilirubin: 0.7 mg/dL (ref 0.2–1.2)
Total Protein: 6.4 g/dL (ref 6.1–8.1)

## 2016-09-03 LAB — LIPID PANEL
CHOL/HDL RATIO: 3.8 ratio (ref <=5.0)
CHOLESTEROL: 145 mg/dL (ref 125–200)
HDL: 38 mg/dL — ABNORMAL LOW (ref >=46)
LDL Cholesterol: 60 mg/dL (ref <130)
Triglycerides: 235 mg/dL — ABNORMAL HIGH (ref <150)
VLDL: 47 mg/dL — ABNORMAL HIGH (ref <30)

## 2016-09-03 LAB — TSH: TSH: 1.96 m[IU]/L

## 2016-09-03 NOTE — Patient Instructions (Addendum)
Your ears and sinuses are connected by the eustachian tube. When your sinuses are inflamed, this can close off the tube and cause fluid to collect in your middle ear. This can then cause dizziness, popping, clicking, ringing, and echoing in your ears. This is often NOT an infection and does NOT require antibiotics, it is caused by inflammation so the treatments help the inflammation. This can take a long time to get better so please be patient.  Here are things you can do to help with this: - Try the Flonase or Nasonex. Remember to spray each nostril twice towards the outer part of your eye.  Do not sniff but instead pinch your nose and tilt your head back to help the medicine get into your sinuses.  The best time to do this is at bedtime.Stop if you get blurred vision or nose bleeds.  -While drinking fluids, pinch and hold nose close and swallow, to help open eustachian tubes to drain fluid behind ear drums. -Please pick one of the over the counter allergy medications below and take it once daily for allergies.  It will also help with fluid behind ear drums. Claritin or loratadine cheapest but likely the weakest  Zyrtec or certizine at night because it can make you sleepy The strongest is allegra or fexafinadine  Cheapest at [employer], [employer], [employer] -can use decongestant over the counter, please do not use if you have high blood pressure or certain heart conditions.   if worsening HA, changes vision/speech, imbalance, weakness go to the ER   We want weight loss that will last so you should lose 1-2 pounds a week.  THAT IS IT! Please pick THREE things a month to change. Once it is a habit check off the item. Then pick another three items off the list to become habits.  If you are already doing a habit on the list GREAT!  Cross that item off! o Don't drink your calories. Ie, alcohol, soda, fruit juice, and sweet tea.  o Drink more water. Drink a glass when you feel hungry or before each meal.  o Eat  breakfast - Complex carb and protein (likeDannon light and fit yogurt, oatmeal, fruit, eggs, [place] bacon). o Measure your cereal.  Eat no more than one cup a day. (ie [place]) o Eat an apple a day. o Add a vegetable a day. o Try a new vegetable a month. o Use Pam! Stop using oil or butter to cook. o Don't finish your plate or use smaller plates. o Share your dessert. o Eat sugar free Jello for dessert or frozen grapes. o Don't eat 2-3 hours before bed. o Switch to whole wheat bread, pasta, and brown rice. o Make healthier choices when you eat out. No fries! o Pick baked chicken, NOT fried. o Don't forget to SLOW DOWN when you eat. It is not going anywhere.  o Take the stairs. o Park far away in the parking lot o [employer] (or weights) for 10 minutes while watching TV. o Walk at work for 10 minutes during break. o Walk outside 1 time a week with your friend, kids, dog, or significant other. o Start a walking group at [place] the mall as much as you can tolerate.  o Keep a food diary. o Weigh yourself daily. o Walk for 15 minutes 3 days per week. o Cook at home more often and eat out less.  If life happens and you go back to old habits, it is  okay.  Just start over. You can do it!   If you experience chest pain, get short of breath, or tired during the exercise, please stop immediately and inform your doctor.

## 2016-09-03 NOTE — Progress Notes (Signed)
Assessment and Plan:  Hypertension:  -Continue medication,  -monitor blood pressure at home.  -Continue DASH diet.   -Reminder to go to the ER if any CP, SOB, nausea, dizziness, severe HA, changes vision/speech, left arm numbness and tingling, and jaw pain.  Cholesterol: -Continue diet and exercise.  -Check cholesterol.   Pre-diabetes: -Continue diet and exercise.  -Check A1C  Vitamin D Def: -check level -continue medications.   Morbid Obesity with co morbidities - long discussion about weight loss, diet, and exercise  Continue diet and meds as discussed. Further disposition pending results of labs.  HPI 75 y.o. female  presents for 3 month follow up with hypertension, hyperlipidemia, prediabetes and vitamin D.   Her blood pressure has been controlled at home, today their BP is BP: 118/70.   She does workout. She denies chest pain, shortness of breath, dizziness.   She is on cholesterol medication and denies myalgias. Her cholesterol is at goal. The cholesterol last visit was:   Lab Results  Component Value Date   CHOL 173 05/28/2016   HDL 43 (L) 05/28/2016   LDLCALC 88 05/28/2016   TRIG 208 (H) 05/28/2016   CHOLHDL 4.0 05/28/2016    She has been working on diet and exercise for diabetes with CKD, last visit she was in the DM range, and denies foot ulcerations, hyperglycemia, hypoglycemia , increased appetite, nausea, paresthesia of the feet, polydipsia, polyuria, visual disturbances, vomiting and weight loss. Last A1C in the office was:  Lab Results  Component Value Date   HGBA1C 6.5 (H) 05/28/2016   She has CKD due to HTN/DM2 Lab Results  Component Value Date   GFRNONAA 25 (L) 05/28/2016   Patient is on Vitamin D supplement.  Lab Results  Component Value Date   VD25OH 44 05/28/2016     BMI is Body mass index is 31.49 kg/m., she is working on diet and exercise. Wt Readings from Last 3 Encounters:  09/03/16 180 lb 9.6 oz (81.9 kg)  05/28/16 185 lb (83.9 kg)   02/20/16 182 lb (82.6 kg)   Patient is on allopurinol for gout and does not report a recent flare.  Lab Results  Component Value Date   LABURIC 5.7 05/28/2016   Current Medications:  Current Outpatient Prescriptions on File Prior to Visit  Medication Sig Dispense Refill  . allopurinol (ZYLOPRIM) 300 MG tablet TAKE 1 TABLET BY MOUTH EVERY DAY 90 tablet 99  . amLODipine (NORVASC) 5 MG tablet TAKE 1 TABLET EVERYDAY AT BEDTIME  6  . atenolol (TENORMIN) 50 MG tablet TAKE 1 TABLET DAILY FOR BLOOD PRESSURE 90 tablet 4  . atorvastatin (LIPITOR) 20 MG tablet TAKE 1 TABLET DAILY AS DIRECTED FOR CHOLESTEROL 90 tablet 1  . BABY ASPIRIN PO Take 81 mg by mouth daily.    . calcitRIOL (ROCALTROL) 0.25 MCG capsule Take 0.25 mcg by mouth daily.    . furosemide (LASIX) 40 MG tablet TAKE 1 TABLET DAILY FOR BLOOD PRESSURE AND FLUID 90 tablet 3  . Multiple Vitamins-Minerals (MULTIVITAMIN PO) Take by mouth daily.    Marland Kitchen NEXIUM 40 MG capsule     . Omega-3 Fatty Acids (FISH OIL PO) Take by mouth daily.    . ranitidine (ZANTAC) 300 MG tablet TAKE 1 TO 2 TABLETS DAILY FOR ACID INDIGESTION AND REFLUX TO ALLOW TRANSITION OFF NEXIUM 180 tablet 1   No current facility-administered medications on file prior to visit.     Medical History:  Past Medical History:  Diagnosis Date  . Cataract   .  Chronic kidney disease   . DDD (degenerative disc disease), lumbar   . DJD (degenerative joint disease)   . GERD (gastroesophageal reflux disease)   . Hyperlipidemia   . Hypertension   . IBS (irritable bowel syndrome)   . Prediabetes     Allergies:  Allergies  Allergen Reactions  . Ace Inhibitors     Pt unsure of reaction  . Feldene [Piroxicam] Rash     Review of Systems:  Review of Systems  Constitutional: Negative for chills, fever and malaise/fatigue.  HENT: Positive for congestion. Negative for ear pain and sore throat.   Eyes: Negative.   Respiratory: Negative for cough, shortness of breath and wheezing.    Cardiovascular: Negative for chest pain, palpitations and leg swelling.  Gastrointestinal: Negative for blood in stool, constipation, diarrhea, heartburn and melena.  Genitourinary: Negative.   Neurological: Negative for dizziness, loss of consciousness and headaches.  Psychiatric/Behavioral: Negative for depression. The patient is not nervous/anxious and does not have insomnia.     Family history- Review and unchanged  Social history- Review and unchanged  Physical Exam: BP 118/70   Pulse 86   Temp 97.6 F (36.4 C)   Resp 16   Ht 5' 3.5" (1.613 m)   Wt 180 lb 9.6 oz (81.9 kg)   SpO2 97%   BMI 31.49 kg/m  Wt Readings from Last 3 Encounters:  09/03/16 180 lb 9.6 oz (81.9 kg)  05/28/16 185 lb (83.9 kg)  02/20/16 182 lb (82.6 kg)   General Appearance: Well nourished well developed, in no apparent distress. Eyes: PERRLA, EOMs, conjunctiva no swelling or erythema ENT/Mouth: Ear canals normal without obstruction, swelling, erythma, discharge.  TMs normal bilaterally.  Oropharynx moist, clear, without exudate, or postoropharyngeal swelling. Neck: Supple, thyroid normal,no cervical adenopathy  Respiratory: Respiratory effort normal, Breath sounds clear A&P without rhonchi, wheeze, or rale.  No retractions, no accessory usage. Cardio: RRR with no MRGs. Brisk peripheral pulses without edema.  Abdomen: Soft, + BS,  Non tender, no guarding, rebound, hernias, masses. Musculoskeletal: Full ROM, 5/5 strength, Normal gait Skin: Warm, dry without rashes, lesions, ecchymosis.  Neuro: Awake and oriented X 3, Cranial nerves intact. Normal muscle tone, no cerebellar symptoms. Psych: Normal affect, Insight and Judgment appropriate.    Vicie Mutters, PA-C 2:35 PM [facility]

## 2016-09-04 LAB — HEMOGLOBIN A1C
Hgb A1c MFr Bld: 6.1 % — ABNORMAL HIGH (ref <5.7)
MEAN PLASMA GLUCOSE: 128 mg/dL

## 2016-09-06 ENCOUNTER — Other Ambulatory Visit: Payer: Self-pay | Admitting: Internal Medicine

## 2016-09-25 DIAGNOSIS — H43812 Vitreous degeneration, left eye: Secondary | ICD-10-CM | POA: Diagnosis not present

## 2016-09-25 LAB — HM DIABETES EYE EXAM

## 2016-10-29 ENCOUNTER — Other Ambulatory Visit: Payer: Self-pay | Admitting: Physician Assistant

## 2016-12-24 ENCOUNTER — Ambulatory Visit (INDEPENDENT_AMBULATORY_CARE_PROVIDER_SITE_OTHER): Payer: Medicare Other | Admitting: Internal Medicine

## 2016-12-24 ENCOUNTER — Other Ambulatory Visit: Payer: Self-pay | Admitting: Internal Medicine

## 2016-12-24 ENCOUNTER — Encounter: Payer: Self-pay | Admitting: Internal Medicine

## 2016-12-24 VITALS — BP 138/68 | HR 56 | Temp 97.5°F | Resp 16 | Ht 63.5 in | Wt 179.4 lb

## 2016-12-24 DIAGNOSIS — I10 Essential (primary) hypertension: Secondary | ICD-10-CM

## 2016-12-24 DIAGNOSIS — E1122 Type 2 diabetes mellitus with diabetic chronic kidney disease: Secondary | ICD-10-CM | POA: Diagnosis not present

## 2016-12-24 DIAGNOSIS — Z1212 Encounter for screening for malignant neoplasm of rectum: Secondary | ICD-10-CM

## 2016-12-24 DIAGNOSIS — K219 Gastro-esophageal reflux disease without esophagitis: Secondary | ICD-10-CM

## 2016-12-24 DIAGNOSIS — N182 Chronic kidney disease, stage 2 (mild): Secondary | ICD-10-CM

## 2016-12-24 DIAGNOSIS — N184 Chronic kidney disease, stage 4 (severe): Secondary | ICD-10-CM

## 2016-12-24 DIAGNOSIS — E782 Mixed hyperlipidemia: Secondary | ICD-10-CM

## 2016-12-24 DIAGNOSIS — M1000 Idiopathic gout, unspecified site: Secondary | ICD-10-CM | POA: Diagnosis not present

## 2016-12-24 DIAGNOSIS — Z79899 Other long term (current) drug therapy: Secondary | ICD-10-CM

## 2016-12-24 DIAGNOSIS — E559 Vitamin D deficiency, unspecified: Secondary | ICD-10-CM

## 2016-12-24 DIAGNOSIS — E0822 Diabetes mellitus due to underlying condition with diabetic chronic kidney disease: Secondary | ICD-10-CM

## 2016-12-24 DIAGNOSIS — Z136 Encounter for screening for cardiovascular disorders: Secondary | ICD-10-CM | POA: Diagnosis not present

## 2016-12-24 DIAGNOSIS — Z1231 Encounter for screening mammogram for malignant neoplasm of breast: Secondary | ICD-10-CM

## 2016-12-24 LAB — CBC WITH DIFFERENTIAL/PLATELET
BASOS PCT: 0 %
Basophils Absolute: 0 cells/uL (ref 0–200)
EOS PCT: 2 %
Eosinophils Absolute: 208 cells/uL (ref 15–500)
HCT: 36.4 % (ref 35.0–45.0)
Hemoglobin: 12.2 g/dL (ref 11.7–15.5)
LYMPHS PCT: 20 %
Lymphs Abs: 2080 cells/uL (ref 850–3900)
MCH: 30.3 pg (ref 27.0–33.0)
MCHC: 33.5 g/dL (ref 32.0–36.0)
MCV: 90.3 fL (ref 80.0–100.0)
MPV: 10.5 fL (ref 7.5–12.5)
Monocytes Absolute: 936 cells/uL (ref 200–950)
Monocytes Relative: 9 %
NEUTROS PCT: 69 %
Neutro Abs: 7176 cells/uL (ref 1500–7800)
Platelets: 263 10*3/uL (ref 140–400)
RBC: 4.03 MIL/uL (ref 3.80–5.10)
RDW: 14.8 % (ref 11.0–15.0)
WBC: 10.4 10*3/uL (ref 3.8–10.8)

## 2016-12-24 LAB — HEMOGLOBIN A1C
Hgb A1c MFr Bld: 5.7 % — ABNORMAL HIGH (ref <5.7)
Mean Plasma Glucose: 117 mg/dL

## 2016-12-24 LAB — TSH: TSH: 2.08 mIU/L

## 2016-12-24 NOTE — Progress Notes (Signed)
[facility] Unk Pinto, M.D.    [name]. [name], P.A.-C      [name], P.A.-C  [facility]                [address] [place] [address] Telephone [phone] Telefax [phone]  Comprehensive Evaluation &  Examination     This very nice 76 y.o. [name] presents for a Screening/Preventative Visit & comprehensive evaluation and management of multiple medical co-morbidities.  Patient has been followed for HTN, Hyperlipidemia, T2_NIDDM,  GERD and Vitamin D Deficiency. Her Gout has been asymptomatic apparently controlled with Allopurinol.      HTN predates circa 1991. Patient's BP has been controlled at home and patient denies any cardiac symptoms as chest pain, palpitations, shortness of breath, dizziness or ankle swelling. Today's BP is borderline elevated at 138/68.      Patient's hyperlipidemia is controlled with diet and medications. Patient denies myalgias or other medication SE's. Last lipids were at goal albeit elevated Trig's: Lab Results  Component Value Date   CHOL 145 09/03/2016   HDL 38 (L) 09/03/2016   LDLCALC 60 09/03/2016   TRIG 235 (H) 09/03/2016   CHOLHDL 3.8 09/03/2016      Patient has Morbid Obesity (BMI 31+) with PreDiabetes in 2014 with A1c 6.0% and then T2_NIDDM predating since Mar 2017 with A1c 6.6% which she is attempting to control with diet. 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 In 2012 she was hospitalized with ARF and has been advised avoidance of ACEi/ARBs and NSAID's. She is followed by Dr Deterding. Patient denies reactive hypoglycemic symptoms, visual blurring, diabetic polys, or paresthesias. Last A1c was not at goal: Lab Results  Component Value Date   HGBA1C 6.1 (H) 09/03/2016      Finally, patient has history of Vitamin D Deficiency in 2008 of "34" and last Vitamin D was still low (goal is 70-100): Lab Results  Component Value  Date   VD25OH 44 05/28/2016   Current Outpatient Prescriptions on File Prior to Visit  Medication Sig  . allopurinol  300 MG tablet TAKE 1 TABLET BY MOUTH EVERY DAY  . amLODipine  5 MG tablet TAKE 1 TABLET EVERYDAY AT BEDTIME  . atenolol  50 MG tablet TAKE 1 TABLET DAILY FOR BLOOD PRESSURE  . atorvastatin  20 MG tablet TAKE 1 TABLET DAILY AS DIRECTED FOR CHOLESTEROL  . BABY ASPIRIN Take 81 mg by mouth daily.  . calcitRIOL (ROCALTROL) 0.25 MCG  Take 0.25 mcg by mouth daily.  . furosemide  40 MG tablet TAKE 1 TABLET DAILY FOR BLOOD PRESSURE AND FLUID  . Multiple Vitamins-Minerals Take by mouth daily.  Marland Kitchen NEXIUM 40 MG capsule   . Omega-3 FISH OIL  Take by mouth daily.  . ranitidine 300 MG tablet Take 1 tablet 2 x/ day for Acid Indigestion & Reflux   Allergies  Allergen Reactions  . Ace Inhibitors     Pt unsure of reaction  . Feldene [Piroxicam] Rash   Past Medical History:  Diagnosis Date  . Cataract   . Chronic kidney disease   . DDD (degenerative disc disease), lumbar   . DJD (degenerative joint disease)   . GERD (gastroesophageal reflux disease)   . Hyperlipidemia   . Hypertension   . IBS (irritable bowel syndrome)   . Prediabetes    Health  Maintenance  Topic Date Due  . OPHTHALMOLOGY EXAM  07/29/1951  . FOOT EXAM  04/28/2015  . URINE MICROALBUMIN  11/10/2016  . HEMOGLOBIN A1C  03/04/2017  . MAMMOGRAM  12/20/2017  . COLONOSCOPY  08/30/2024  . TETANUS/TDAP  09/19/2025  . INFLUENZA VACCINE  Addressed  . DEXA SCAN  Completed  . ZOSTAVAX  Completed  . PNA vac Low Risk Adult  Completed   Immunization History  Administered Date(s) Administered  . DT 09/29/2014, 09/20/2015  . Influenza Split 09/28/2013  . Influenza, High Dose Seasonal PF 10/13/2014, 09/03/2016  . Influenza-Unspecified 08/04/2015  . Pneumococcal Conjugate-13 10/13/2014  . Pneumococcal Polysaccharide-23 09/23/2012  . Td 09/02/2004  . Zoster 10/13/2013   Past Surgical History:  Procedure Laterality  Date  . ABDOMINAL HYSTERECTOMY    . CATARACT EXTRACTION    . COLONOSCOPY  x 2   w/Dr Carlean Purl   Family History  Problem Relation Age of Onset  . Cancer Mother     liver  . Liver cancer Mother   . Heart disease Father   . Heart attack Father   . Hypertension Sister   . Cancer Sister     kidney  . Colon cancer Neg Hx    Social History  Substance Use Topics  . Smoking status: Former Smoker    Quit date: 06/16/2004  . Smokeless tobacco: Never Used  . Alcohol use No    ROS Constitutional: Denies fever, chills, weight loss/gain, headaches, insomnia,  night sweats, and change in appetite. Does c/o fatigue. Eyes: Denies redness, blurred vision, diplopia, discharge, itchy, watery eyes.  ENT: Denies discharge, congestion, post nasal drip, epistaxis, sore throat, earache, hearing loss, dental pain, Tinnitus, Vertigo, Sinus pain, snoring.  Cardio: Denies chest pain, palpitations, irregular heartbeat, syncope, dyspnea, diaphoresis, orthopnea, PND, claudication, edema Respiratory: denies cough, dyspnea, DOE, pleurisy, hoarseness, laryngitis, wheezing.  Gastrointestinal: Denies dysphagia, heartburn, reflux, water brash, pain, cramps, nausea, vomiting, bloating, diarrhea, constipation, hematemesis, melena, hematochezia, jaundice, hemorrhoids Genitourinary: Denies dysuria, frequency, urgency, nocturia, hesitancy, discharge, hematuria, flank pain Breast: Breast lumps, nipple discharge, bleeding.  Musculoskeletal: Denies arthralgia, myalgia, stiffness, Jt. Swelling, pain, limp, and strain/sprain. Denies falls. Skin: Denies puritis, rash, hives, warts, acne, eczema, changing in skin lesion Neuro: No weakness, tremor, incoordination, spasms, paresthesia, pain Psychiatric: Denies confusion, memory loss, sensory loss. Denies Depression. Endocrine: Denies change in weight, skin, hair change, nocturia, and paresthesia, diabetic polys, visual blurring, hyper / hypo glycemic episodes.  Heme/Lymph: No  excessive bleeding, bruising, enlarged lymph nodes.  Physical Exam  BP 138/68   Pulse (!) 56   Temp 97.5 F (36.4 C)   Resp 16   Ht 5' 3.5" (1.613 m)   Wt 179 lb 6.4 oz (81.4 kg)   BMI 31.28 kg/m   General Appearance: Well nourished and in no apparent distress.  Eyes: PERRLA, EOMs, conjunctiva no swelling or erythema, normal fundi and vessels. Sinuses: No frontal/maxillary tenderness ENT/Mouth: EACs patent / TMs  nl. Nares clear without erythema, swelling, mucoid exudates. Oral hygiene is good. No erythema, swelling, or exudate. Tongue normal, non-obstructing. Tonsils not swollen or erythematous. Hearing normal.  Neck: Supple, thyroid normal. No bruits, nodes or JVD. Respiratory: Respiratory effort normal.  BS equal and clear bilateral without rales, rhonci, wheezing or stridor. Cardio: Heart sounds are normal with regular rate and rhythm and no murmurs, rubs or gallops. Peripheral pulses are normal and equal bilaterally without edema. No aortic or femoral bruits. Chest: symmetric with normal excursions and percussion. Breasts: Symmetric, without lumps, nipple discharge,  retractions, or fibrocystic changes.  Abdomen: Flat, soft with bowel sounds active. Nontender, no guarding, rebound, hernias, masses, or organomegaly.  Lymphatics: Non tender without lymphadenopathy.  Musculoskeletal: Full ROM all peripheral extremities, joint stability, 5/5 strength, and normal gait. Skin: Warm and dry without rashes, lesions, cyanosis, clubbing or  ecchymosis.  Neuro: Cranial nerves intact, reflexes equal bilaterally. Normal muscle tone, no cerebellar symptoms. Sensation intact to touch, vibratory and Monofilament to the toes bilaterally.  Pysch: Alert and oriented X 3, normal affect, Insight and Judgment appropriate.   Assessment and Plan . 1. Essential hypertension  - Microalbumin / creatinine urine ratio - EKG 12-Lead - Urinalysis, Routine w reflex microscopic - CBC with  Differential/Platelet - BASIC METABOLIC PANEL WITH GFR - TSH  2. Mixed hyperlipidemia  - EKG 12-Lead - Hepatic function panel - Lipid panel - TSH  3. 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 Vitamin D deficiency   5. Screening for ischemic heart disease  - EKG 12-Lead  6. Idiopathic gout  - Uric acid  7. Gastroesophageal reflux disease   8. Screening for rectal cancer  - POC Hemoccult Bld/Stl   9. 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 Medication management  - Urinalysis, Routine w reflex microscopic - CBC with Differential/Platelet - BASIC METABOLIC PANEL WITH GFR - Hepatic function panel - Magnesium - VITAMIN D 25 Hydroxy        Continue prudent diet as discussed, weight control, BP monitoring, regular exercise, and medications. Discussed med's effects and SE's. Screening labs and tests as requested with regular follow-up as recommended. Over 40 minutes of exam, counseling, chart review and high complex critical decision making was performed.

## 2016-12-24 NOTE — Patient Instructions (Signed)
Preventive Care for Adults  A healthy lifestyle and preventive care can promote health and wellness. Preventive health guidelines for women include the following key practices.  A routine yearly physical is a good way to check with your health care provider about your health and preventive screening. It is a chance to share any concerns and updates on your health and to receive a thorough exam.  Visit your dentist for a routine exam and preventive care every 6 months. Brush your teeth twice a day and floss once a day. Good oral hygiene prevents tooth decay and gum disease.  The frequency of eye exams is based on your age, health, family medical history, use of contact lenses, and other factors. Follow your health care provider's recommendations for frequency of eye exams.  Eat a healthy diet. Foods like vegetables, fruits, whole grains, low-fat dairy products, and lean protein foods contain the nutrients you need without too many calories. Decrease your intake of foods high in solid fats, added sugars, and salt. Eat the right amount of calories for you.Get information about a proper diet from your health care provider, if necessary.  Regular physical exercise is one of the most important things you can do for your health. Most adults should get at least 150 minutes of moderate-intensity exercise (any activity that increases your heart rate and causes you to sweat) each week. In addition, most adults need muscle-strengthening exercises on 2 or more days a week.  Maintain a healthy weight. The body mass index (BMI) is a screening tool to identify possible weight problems. It provides an estimate of body fat based on height and weight. Your health care provider can find your BMI and can help you achieve or maintain a healthy weight.For adults 20 years and older:  A BMI below 18.5 is considered underweight.  A BMI of 18.5 to 24.9 is normal.  A BMI of 25 to 29.9 is considered overweight.  A BMI of  30 and above is considered obese.  Maintain normal blood lipids and cholesterol levels by exercising and minimizing your intake of saturated fat. Eat a balanced diet with plenty of fruit and vegetables. If your lipid or cholesterol levels are high, you are over 50, or you are at high risk for heart disease, you may need your cholesterol levels checked more frequently.Ongoing high lipid and cholesterol levels should be treated with medicines if diet and exercise are not working.  If you smoke, find out from your health care provider how to quit. If you do not use tobacco, do not start.  Lung cancer screening is recommended for adults aged 2-80 years who are at high risk for developing lung cancer because of a history of smoking. A yearly low-dose CT scan of the lungs is recommended for people who have at least a 30-pack-year history of smoking and are a current smoker or have quit within the past 15 years. A pack year of smoking is smoking an average of 1 pack of cigarettes a day for 1 year (for example: 1 pack a day for 30 years or 2 packs a day for 15 years). Yearly screening should continue until the smoker has stopped smoking for at least 15 years. Yearly screening should be stopped for people who develop a health problem that would prevent them from having lung cancer treatment.  Avoid use of street drugs. Do not share needles with anyone. Ask for help if you need support or instructions about stopping the use of drugs.  High  blood pressure causes heart disease and increases the risk of stroke.  Ongoing high blood pressure should be treated with medicines if weight loss and exercise do not work.  If you are 10-46 years old, ask your health care provider if you should take aspirin to prevent strokes.  Diabetes screening involves taking a blood sample to check your fasting blood sugar level. This should be done once every 3 years, after age 87, if you are within normal weight and without risk  factors for diabetes. Testing should be considered at a younger age or be carried out more frequently if you are overweight and have at least 1 risk factor for diabetes.  Breast cancer screening is essential preventive care for women. You should practice "breast self-awareness." This means understanding the normal appearance and feel of your breasts and may include breast self-examination. Any changes detected, no matter how small, should be reported to a health care provider. Women in their 35s and 30s should have a clinical breast exam (CBE) by a health care provider as part of a regular health exam every 1 to 3 years. After age 23, women should have a CBE every year. Starting at age 17, women should consider having a mammogram (breast X-ray test) every year. Women who have a family history of breast cancer should talk to their health care provider about genetic screening. Women at a high risk of breast cancer should talk to their health care providers about having an MRI and a mammogram every year.  Breast cancer gene (BRCA)-related cancer risk assessment is recommended for women who have family members with BRCA-related cancers. BRCA-related cancers include breast, ovarian, tubal, and peritoneal cancers. Having family members with these cancers may be associated with an increased risk for harmful changes (mutations) in the breast cancer genes BRCA1 and BRCA2. Results of the assessment will determine the need for genetic counseling and BRCA1 and BRCA2 testing.  Routine pelvic exams to screen for cancer are no longer recommended for nonpregnant women who are considered low risk for cancer of the pelvic organs (ovaries, uterus, and vagina) and who do not have symptoms. Ask your health care provider if a screening pelvic exam is right for you.  If you have had past treatment for cervical cancer or a condition that could lead to cancer, you need Pap tests and screening for cancer for at least 20 years after  your treatment. If Pap tests have been discontinued, your risk factors (such as having a new sexual partner) need to be reassessed to determine if screening should be resumed. Some women have medical problems that increase the chance of getting cervical cancer. In these cases, your health care provider may recommend more frequent screening and Pap tests.    Colorectal cancer can be detected and often prevented. Most routine colorectal cancer screening begins at the age of 71 years and continues through age 28 years. However, your health care provider may recommend screening at an earlier age if you have risk factors for colon cancer. On a yearly basis, your health care provider may provide home test kits to check for hidden blood in the stool. Use of a small camera at the end of a tube, to directly examine the colon (sigmoidoscopy or colonoscopy), can detect the earliest forms of colorectal cancer. Talk to your health care provider about this at age 4, when routine screening begins. Direct exam of the colon should be repeated every 5-10 years through age 34 years, unless early forms of pre-cancerous  polyps or small growths are found.  Osteoporosis is a disease in which the bones lose minerals and strength with aging. This can result in serious bone fractures or breaks. The risk of osteoporosis can be identified using a bone density scan. Women ages 61 years and over and women at risk for fractures or osteoporosis should discuss screening with their health care providers. Ask your health care provider whether you should take a calcium supplement or vitamin D to reduce the rate of osteoporosis.  Menopause can be associated with physical symptoms and risks. Hormone replacement therapy is available to decrease symptoms and risks. You should talk to your health care provider about whether hormone replacement therapy is right for you.  Use sunscreen. Apply sunscreen liberally and repeatedly throughout the day.  You should seek shade when your shadow is shorter than you. Protect yourself by wearing long sleeves, pants, a wide-brimmed hat, and sunglasses year round, whenever you are outdoors.  Once a month, do a whole body skin exam, using a mirror to look at the skin on your back. Tell your health care provider of new moles, moles that have irregular borders, moles that are larger than a pencil eraser, or moles that have changed in shape or color.  Stay current with required vaccines (immunizations).  Influenza vaccine. All adults should be immunized every year.  Tetanus, diphtheria, and acellular pertussis (Td, Tdap) vaccine. Pregnant women should receive 1 dose of Tdap vaccine during each pregnancy. The dose should be obtained regardless of the length of time since the last dose. Immunization is preferred during the 27th-36th week of gestation. An adult who has not previously received Tdap or who does not know her vaccine status should receive 1 dose of Tdap. This initial dose should be followed by tetanus and diphtheria toxoids (Td) booster doses every 10 years. Adults with an unknown or incomplete history of completing a 3-dose immunization series with Td-containing vaccines should begin or complete a primary immunization series including a Tdap dose. Adults should receive a Td booster every 10 years.    Zoster vaccine. One dose is recommended for adults aged 65 years or older unless certain conditions are present.    Pneumococcal 13-valent conjugate (PCV13) vaccine. When indicated, a person who is uncertain of her immunization history and has no record of immunization should receive the PCV13 vaccine. An adult aged 49 years or older who has certain medical conditions and has not been previously immunized should receive 1 dose of PCV13 vaccine. This PCV13 should be followed with a dose of pneumococcal polysaccharide (PPSV23) vaccine. The PPSV23 vaccine dose should be obtained at least 8 weeks after the  dose of PCV13 vaccine. An adult aged 84 years or older who has certain medical conditions and previously received 1 or more doses of PPSV23 vaccine should receive 1 dose of PCV13. The PCV13 vaccine dose should be obtained 1 or more years after the last PPSV23 vaccine dose.    Pneumococcal polysaccharide (PPSV23) vaccine. When PCV13 is also indicated, PCV13 should be obtained first. All adults aged 29 years and older should be immunized. An adult younger than age 58 years who has certain medical conditions should be immunized. Any person who resides in a nursing home or long-term care facility should be immunized. An adult smoker should be immunized. People with an immunocompromised condition and certain other conditions should receive both PCV13 and PPSV23 vaccines. People with human immunodeficiency virus (HIV) infection should be immunized as soon as possible after diagnosis. Immunization  during chemotherapy or radiation therapy should be avoided. Routine use of PPSV23 vaccine is not recommended for American Indians, [place] Natives, or people younger than 65 years unless there are medical conditions that require PPSV23 vaccine. When indicated, people who have unknown immunization and have no record of immunization should receive PPSV23 vaccine. One-time revaccination 5 years after the first dose of PPSV23 is recommended for people aged 19-64 years who have chronic kidney failure, nephrotic syndrome, asplenia, or immunocompromised conditions. People who received 1-2 doses of PPSV23 before age 65 years should receive another dose of PPSV23 vaccine at age 22 years or later if at least 5 years have passed since the previous dose. Doses of PPSV23 are not needed for people immunized with PPSV23 at or after age 37 years.   Preventive Services / Frequency  Ages 5 years and over  Blood pressure check.  Lipid and cholesterol check.  Lung cancer screening. / Every year if you are aged 93-80 years and have a  30-pack-year history of smoking and currently smoke or have quit within the past 15 years. Yearly screening is stopped once you have quit smoking for at least 15 years or develop a health problem that would prevent you from having lung cancer treatment.  Clinical breast exam.** / Every year after age 49 years.  BRCA-related cancer risk assessment.** / For women who have family members with a BRCA-related cancer (breast, ovarian, tubal, or peritoneal cancers).  Mammogram.** / Every year beginning at age 64 years and continuing for as long as you are in good health. Consult with your health care provider.  Pap test.** / Every 3 years starting at age 97 years through age 69 or 54 years with 3 consecutive normal Pap tests. Testing can be stopped between 65 and 70 years with 3 consecutive normal Pap tests and no abnormal Pap or HPV tests in the past 10 years.  Fecal occult blood test (FOBT) of stool. / Every year beginning at age 70 years and continuing until age 51 years. You may not need to do this test if you get a colonoscopy every 10 years.  Flexible sigmoidoscopy or colonoscopy.** / Every 5 years for a flexible sigmoidoscopy or every 10 years for a colonoscopy beginning at age 65 years and continuing until age 55 years.  Hepatitis C blood test.** / For all people born from [birth year] through 1964 and any individual with known risks for hepatitis C.  Osteoporosis screening.** / A one-time screening for women ages 56 years and over and women at risk for fractures or osteoporosis.  Skin self-exam. / Monthly.  Influenza vaccine. / Every year.  Tetanus, diphtheria, and acellular pertussis (Tdap/Td) vaccine.** / 1 dose of Td every 10 years.  Zoster vaccine.** / 1 dose for adults aged 55 years or older.  Pneumococcal 13-valent conjugate (PCV13) vaccine.** / Consult your health care provider.  Pneumococcal polysaccharide (PPSV23) vaccine.** / 1 dose for all adults aged 75 years and older. Screening  for abdominal aortic aneurysm (AAA)  by ultrasound is recommended for people who have history of high blood pressure or who are current or former smokers. ++++++++++++++++++++ Recommend Adult Low Dose Aspirin or  coated  Aspirin 81 mg daily  To reduce risk of Colon Cancer 20 %,  Skin Cancer 26 % ,  Melanoma 46%  and  Pancreatic cancer 60% ++++++++++++++++++++ Vitamin D goal  is between 70-100.  Please make sure that you are taking your Vitamin D as directed.  It is very important as  a natural anti-inflammatory  helping hair, skin, and nails, as well as reducing stroke and heart attack risk.  It helps your bones and helps with mood. It also decreases numerous cancer risks so please take it as directed.  Low Vit D is associated with a 200-300% higher risk for CANCER  and 200-300% higher risk for HEART   ATTACK  &  STROKE.   .....................................Marland Kitchen It is also associated with higher death rate at younger ages,  autoimmune diseases like Rheumatoid arthritis, Lupus, Multiple Sclerosis.    Also many other serious conditions, like depression, Alzheimer's Dementia, infertility, muscle aches, fatigue, fibromyalgia - just to name a few. ++++++++++++++++++ Recommend the book "The END of DIETING" by Dr Excell Seltzer  & the book "The END of DIABETES " by Dr Excell Seltzer At [email or web address] - get book & Audio CD's    Being diabetic has a  300% increased risk for heart attack, stroke, cancer, and alzheimer- type vascular dementia. It is very important that you work harder with diet by avoiding all foods that are white. Avoid white rice (brown & wild rice is OK), white potatoes (sweetpotatoes in moderation is OK), White bread or wheat bread or anything made out of white flour like bagels, donuts, rolls, buns, biscuits, cakes, pastries, cookies, pizza crust, and pasta (made from white flour & egg whites) - vegetarian pasta or spinach or wheat pasta is OK. Multigrain breads like Arnold's or  [employer], or multigrain sandwich thins or flatbreads.  Diet, exercise and weight loss can reverse and cure diabetes in the early stages.  Diet, exercise and weight loss is very important in the control and prevention of complications of diabetes which affects every system in your body, ie. Brain - dementia/stroke, eyes - glaucoma/blindness, heart - heart attack/heart failure, kidneys - dialysis, stomach - gastric paralysis, intestines - malabsorption, nerves - severe painful neuritis, circulation - gangrene & loss of a leg(s), and finally cancer and Alzheimers.    I recommend avoid fried & greasy foods,  sweets/candy, white rice (brown or wild rice or Quinoa is OK), white potatoes (sweet potatoes are OK) - anything made from white flour - bagels, doughnuts, rolls, buns, biscuits,white and wheat breads, pizza crust and traditional pasta made of white flour & egg white(vegetarian pasta or spinach or wheat pasta is OK).  Multi-grain bread is OK - like multi-grain flat bread or sandwich thins. Avoid alcohol in excess. Exercise is also important.    Eat all the vegetables you want - avoid meat, especially red meat and dairy - especially cheese.  Cheese is the most concentrated form of trans-fats which is the worst thing to clog up our arteries. Veggie cheese is OK which can be found in the fresh produce section at [employer] or [employer] or [employer]  +++++++++++++++++++ DASH Eating Plan  DASH stands for "Dietary Approaches to Stop Hypertension."   The DASH eating plan is a healthy eating plan that has been shown to reduce high blood pressure (hypertension). Additional health benefits may include reducing the risk of type 2 diabetes mellitus, heart disease, and stroke. The DASH eating plan may also help with weight loss. WHAT DO I NEED TO KNOW ABOUT THE DASH EATING PLAN? For the DASH eating plan, you will follow these general guidelines:  Choose foods with a percent daily value for sodium of  less than 5% (as listed on the food label).  Use salt-free seasonings or herbs instead of table salt or sea salt.  Check with your  health care provider or pharmacist before using salt substitutes.  Eat lower-sodium products, often labeled as "lower sodium" or "no salt added."  Eat fresh foods.  Eat more vegetables, fruits, and low-fat dairy products.  Choose whole grains. Look for the word "whole" as the first word in the ingredient list.  Choose fish   Limit sweets, desserts, sugars, and sugary drinks.  Choose heart-healthy fats.  Eat veggie cheese   Eat more home-cooked food and less restaurant, buffet, and fast food.  Limit fried foods.  Cook foods using methods other than frying.  Limit canned vegetables. If you do use them, rinse them well to decrease the sodium.  When eating at a restaurant, ask that your food be prepared with less salt, or no salt if possible.                      WHAT FOODS CAN I EAT? Read Dr Joel Fuhrman's books on The End of Dieting & The End of Diabetes  Grains Whole grain or whole wheat bread. Brown rice. Whole grain or whole wheat pasta. Quinoa, bulgur, and whole grain cereals. Low-sodium cereals. Corn or whole wheat flour tortillas. Whole grain cornbread. Whole grain crackers. Low-sodium crackers.  Vegetables Fresh or frozen vegetables (raw, steamed, roasted, or grilled). Low-sodium or reduced-sodium tomato and vegetable juices. Low-sodium or reduced-sodium tomato sauce and paste. Low-sodium or reduced-sodium canned vegetables.   Fruits All fresh, canned (in natural juice), or frozen fruits.  Protein Products  All fish and seafood.  Dried beans, peas, or lentils. Unsalted nuts and seeds. Unsalted canned beans.  Dairy Low-fat dairy products, such as skim or 1% milk, 2% or reduced-fat cheeses, low-fat ricotta or cottage cheese, or plain low-fat yogurt. Low-sodium or reduced-sodium cheeses.  Fats and Oils Tub margarines without trans  fats. Light or reduced-fat mayonnaise and salad dressings (reduced sodium). Avocado. Safflower, olive, or canola oils. Natural peanut or almond butter.  Other Unsalted popcorn and pretzels. The items listed above may not be a complete list of recommended foods or beverages. Contact your dietitian for more options.  +++++++++++++++  WHAT FOODS ARE NOT RECOMMENDED? Grains/ White flour or wheat flour White bread. White pasta. White rice. Refined cornbread. Bagels and croissants. Crackers that contain trans fat.  Vegetables  Creamed or fried vegetables. Vegetables in a . Regular canned vegetables. Regular canned tomato sauce and paste. Regular tomato and vegetable juices.  Fruits Dried fruits. Canned fruit in light or heavy syrup. Fruit juice.  Meat and Other Protein Products Meat in general - RED meat & White meat.  Fatty cuts of meat. Ribs, chicken wings, all processed meats as bacon, sausage, bologna, salami, fatback, hot dogs, bratwurst and packaged luncheon meats.  Dairy Whole or 2% milk, cream, half-and-half, and cream cheese. Whole-fat or sweetened yogurt. Full-fat cheeses or blue cheese. Non-dairy creamers and whipped toppings. Processed cheese, cheese spreads, or cheese curds.  Condiments Onion and garlic salt, seasoned salt, table salt, and sea salt. Canned and packaged gravies. Worcestershire sauce. Tartar sauce. Barbecue sauce. Teriyaki sauce. Soy sauce, including reduced sodium. Steak sauce. Fish sauce. Oyster sauce. Cocktail sauce. Horseradish. Ketchup and mustard. Meat flavorings and tenderizers. Bouillon cubes. Hot sauce. Tabasco sauce. Marinades. Taco seasonings. Relishes.  Fats and Oils Butter, stick margarine, lard, shortening and bacon fat. Coconut, palm kernel, or palm oils. Regular salad dressings.  Pickles and olives. Salted popcorn and pretzels.  The items listed above may not be a complete list of foods and beverages to   avoid.

## 2016-12-25 LAB — MICROALBUMIN / CREATININE URINE RATIO: CREATININE, URINE: 35 mg/dL (ref 20–320)

## 2016-12-25 LAB — URINALYSIS, ROUTINE W REFLEX MICROSCOPIC
Bilirubin Urine: NEGATIVE
Glucose, UA: NEGATIVE
HGB URINE DIPSTICK: NEGATIVE
KETONES UR: NEGATIVE
LEUKOCYTES UA: NEGATIVE
Nitrite: NEGATIVE
Protein, ur: NEGATIVE
SPECIFIC GRAVITY, URINE: 1.008 (ref 1.001–1.035)
pH: 6 (ref 5.0–8.0)

## 2016-12-25 LAB — BASIC METABOLIC PANEL WITH GFR
BUN: 36 mg/dL — ABNORMAL HIGH (ref 7–25)
CALCIUM: 9.8 mg/dL (ref 8.6–10.4)
CO2: 27 mmol/L (ref 20–31)
Chloride: 97 mmol/L — ABNORMAL LOW (ref 98–110)
Creat: 1.81 mg/dL — ABNORMAL HIGH (ref 0.60–0.93)
GFR, EST AFRICAN AMERICAN: 31 mL/min — AB (ref >=60)
GFR, EST NON AFRICAN AMERICAN: 27 mL/min — AB (ref >=60)
Glucose, Bld: 114 mg/dL — ABNORMAL HIGH (ref 65–99)
Potassium: 4.2 mmol/L (ref 3.5–5.3)
SODIUM: 137 mmol/L (ref 135–146)

## 2016-12-25 LAB — VITAMIN D 25 HYDROXY (VIT D DEFICIENCY, FRACTURES): Vit D, 25-Hydroxy: 42 ng/mL (ref 30–100)

## 2016-12-25 LAB — HEPATIC FUNCTION PANEL
ALT: 15 U/L (ref 6–29)
AST: 21 U/L (ref 10–35)
Albumin: 3.9 g/dL (ref 3.6–5.1)
Alkaline Phosphatase: 100 U/L (ref 33–130)
BILIRUBIN DIRECT: 0.2 mg/dL (ref <=0.2)
BILIRUBIN INDIRECT: 0.7 mg/dL (ref 0.2–1.2)
BILIRUBIN TOTAL: 0.9 mg/dL (ref 0.2–1.2)
TOTAL PROTEIN: 6.3 g/dL (ref 6.1–8.1)

## 2016-12-25 LAB — MAGNESIUM: Magnesium: 1.9 mg/dL (ref 1.5–2.5)

## 2016-12-25 LAB — LIPID PANEL
Cholesterol: 157 mg/dL (ref <200)
HDL: 40 mg/dL — AB (ref >50)
LDL Cholesterol: 56 mg/dL (ref <100)
TRIGLYCERIDES: 304 mg/dL — AB (ref <150)
Total CHOL/HDL Ratio: 3.9 Ratio (ref <5.0)
VLDL: 61 mg/dL — AB (ref <30)

## 2016-12-25 LAB — INSULIN, RANDOM: Insulin: 28.6 u[IU]/mL — ABNORMAL HIGH (ref 2.0–19.6)

## 2016-12-25 LAB — URIC ACID: URIC ACID, SERUM: 4.4 mg/dL (ref 2.5–7.0)

## 2017-01-12 ENCOUNTER — Other Ambulatory Visit: Payer: Self-pay | Admitting: Internal Medicine

## 2017-01-21 ENCOUNTER — Encounter: Payer: Self-pay | Admitting: Internal Medicine

## 2017-01-21 ENCOUNTER — Ambulatory Visit (INDEPENDENT_AMBULATORY_CARE_PROVIDER_SITE_OTHER): Payer: Medicare Other | Admitting: Internal Medicine

## 2017-01-21 VITALS — BP 126/72 | HR 62 | Temp 98.2°F | Resp 16 | Ht 63.5 in | Wt 180.0 lb

## 2017-01-21 DIAGNOSIS — J0121 Acute recurrent ethmoidal sinusitis: Secondary | ICD-10-CM | POA: Diagnosis not present

## 2017-01-21 MED ORDER — AZITHROMYCIN 250 MG PO TABS: ORAL_TABLET | ORAL | 0 refills | Status: DC

## 2017-01-21 MED ORDER — PREDNISONE 20 MG PO TABS: ORAL_TABLET | ORAL | 0 refills | Status: DC

## 2017-01-21 MED ORDER — PROMETHAZINE-DM 6.25-15 MG/5ML PO SYRP: ORAL_SOLUTION | ORAL | 1 refills | Status: DC

## 2017-01-21 MED ORDER — FLUTICASONE PROPIONATE 50 MCG/ACT NA SUSP: 2.0000 | Freq: Every day | NASAL | 0 refills | Status: DC

## 2017-01-21 NOTE — Progress Notes (Signed)
HPI  Patient presents to the office for evaluation of sinus congestion and headache.  It has been going on for 3 days.  Patient reports minimal cough.  They also endorse change in voice, postnasal drip, wheezing and nasal congestion, thick green nasal sputum, sinus pressure, ear pain, headaches.  .  They have tried mucinex.  They report that nothing has worked.  They denies other sick contacts.  She is a former smoker.  Her daughter became ill after she was.    Review of Systems  Constitutional: Positive for malaise/fatigue. Negative for chills and fever.  HENT: Positive for congestion, ear pain, hearing loss and sore throat.   Respiratory: Positive for cough. Negative for sputum production, shortness of breath and wheezing.   Cardiovascular: Negative for chest pain, palpitations and leg swelling.  Neurological: Positive for headaches.    PE:  Vitals:   01/21/17 1431  BP: 126/72  Pulse: 62  Resp: 16  Temp: 98.2 F (36.8 C)    General:  Alert and non-toxic, WDWN, NAD HEENT: NCAT, PERLA, EOM normal, no occular discharge or erythema.  Nasal mucosal edema with sinus tenderness to palpation.  Oropharynx clear with minimal oropharyngeal edema and erythema.  Mucous membranes moist and pink. Neck:  Cervical adenopathy Chest:  RRR no MRGs.  Lungs clear to auscultation A&P with no wheezes rhonchi or rales.   Abdomen: +BS x 4 quadrants, soft, non-tender, no guarding, rigidity, or rebound. Skin: warm and dry no rash Neuro: A&Ox4, CN II-XII grossly intact  Assessment and Plan:   1. Acute recurrent ethmoidal sinusitis -cont daily antihistamine -nasal saline -recheck as needed.   - fluticasone (FLONASE) 50 MCG/ACT nasal spray; Place 2 sprays into both nostrils daily.  Dispense: 16 g; Refill: 0 - promethazine-dextromethorphan (PROMETHAZINE-DM) 6.25-15 MG/5ML syrup; Take 5-10 ML PO q8hr prn for cough and congestion  Dispense: 360 mL; Refill: 1 - predniSONE (DELTASONE) 20 MG tablet; 3 tabs po  day one, then 2 tabs daily x 4 days  Dispense: 11 tablet; Refill: 0 - azithromycin (ZITHROMAX Z-PAK) 250 MG tablet; 2 po day one, then 1 daily x 4 days  Dispense: 6 tablet; Refill: 0

## 2017-01-21 NOTE — Patient Instructions (Signed)
Please take prednisone taper with breakfast.  You can take all the tablets for 1 day at the same time.  Please use flonase 2 sprays per nostril before bedtime.  Remember to not snort it up, and look down while spraying.  Please take zpak until it is gone.  Please take claritin, zyrtec or allegra.  Store brands and generics are okay.  Get what is on sale and take once daily.  Please take cough syrup up to 3 times daily as needed for coughing and congestion.   You can take 25-50 mg of benadryl at bedtime if you want to help you sleep.

## 2017-01-29 ENCOUNTER — Ambulatory Visit
Admission: RE | Admit: 2017-01-29 | Discharge: 2017-01-29 | Disposition: A | Discharge status: Home / Self Care | Payer: Medicare Other | Source: Ambulatory Visit | Attending: Internal Medicine | Admitting: Internal Medicine

## 2017-01-29 DIAGNOSIS — Z1231 Encounter for screening mammogram for malignant neoplasm of breast: Secondary | ICD-10-CM | POA: Diagnosis not present

## 2017-02-04 DIAGNOSIS — M10379 Gout due to renal impairment, unspecified ankle and foot: Secondary | ICD-10-CM | POA: Diagnosis not present

## 2017-02-04 DIAGNOSIS — I129 Hypertensive chronic kidney disease with stage 1 through stage 4 chronic kidney disease, or unspecified chronic kidney disease: Secondary | ICD-10-CM | POA: Diagnosis not present

## 2017-02-04 DIAGNOSIS — M1990 Unspecified osteoarthritis, unspecified site: Secondary | ICD-10-CM | POA: Diagnosis not present

## 2017-02-04 DIAGNOSIS — N183 Chronic kidney disease, stage 3 (moderate): Secondary | ICD-10-CM | POA: Diagnosis not present

## 2017-02-04 DIAGNOSIS — Z0000 Encounter for general adult medical examination without abnormal findings: Secondary | ICD-10-CM | POA: Diagnosis not present

## 2017-02-04 DIAGNOSIS — N2581 Secondary hyperparathyroidism of renal origin: Secondary | ICD-10-CM | POA: Diagnosis not present

## 2017-02-04 DIAGNOSIS — D631 Anemia in chronic kidney disease: Secondary | ICD-10-CM | POA: Diagnosis not present

## 2017-02-04 DIAGNOSIS — K219 Gastro-esophageal reflux disease without esophagitis: Secondary | ICD-10-CM | POA: Diagnosis not present

## 2017-02-04 DIAGNOSIS — E669 Obesity, unspecified: Secondary | ICD-10-CM | POA: Diagnosis not present

## 2017-02-04 DIAGNOSIS — E782 Mixed hyperlipidemia: Secondary | ICD-10-CM | POA: Diagnosis not present

## 2017-02-14 ENCOUNTER — Other Ambulatory Visit: Payer: Self-pay

## 2017-02-14 DIAGNOSIS — Z1212 Encounter for screening for malignant neoplasm of rectum: Secondary | ICD-10-CM

## 2017-02-14 LAB — POC HEMOCCULT BLD/STL (HOME/3-CARD/SCREEN)
Card #2 Fecal Occult Blod, POC: NEGATIVE
Card #3 Fecal Occult Blood, POC: NEGATIVE
Fecal Occult Blood, POC: NEGATIVE

## 2017-02-24 ENCOUNTER — Other Ambulatory Visit: Payer: Self-pay | Admitting: Physician Assistant

## 2017-03-27 ENCOUNTER — Ambulatory Visit: Payer: Self-pay | Admitting: Internal Medicine

## 2017-03-27 NOTE — Progress Notes (Signed)
MEDICARE ANNUAL WELLNESS VISIT AND CPE  Assessment:   Essential hypertension - continue medications, DASH diet, exercise and monitor at home. Call if greater than 130/80.  -     CBC with Differential/Platelet -     BASIC METABOLIC PANEL WITH GFR -     Hepatic function panel -     TSH  Type 2 diabetes mellitus with stage 4 chronic kidney disease, without long-term current use of insulin (HCC) Discussed general issues about diabetes pathophysiology and management., Educational material distributed., Suggested low cholesterol diet., Encouraged aerobic exercise., Discussed foot care., Reminded to get yearly retinal exam. -     BASIC METABOLIC PANEL WITH GFR -     Hemoglobin A1c  CKD (chronic kidney disease) stage 4, GFR 15-29 ml/min (HCC) Increase fluids, avoid NSAIDS, monitor sugars, will monitor -     BASIC METABOLIC PANEL WITH GFR  Mixed hyperlipidemia -continue medications, check lipids, decrease fatty foods, increase activity.  -     Lipid panel  Morbid obesity ([place]) - long discussion about weight loss, diet, and exercise  Medication management -     Magnesium  Vitamin D deficiency  BMI 31.0-31.9,adult - long discussion about weight loss, diet, and exercise  Idiopathic gout, unspecified chronicity, unspecified site Gout- recheck Uric acid as needed, Diet discussed, continue medications.  Primary osteoarthritis involving multiple joints Continue follow up ortho  DDD (degenerative disc disease), lumbar Continue follow up ortho  Gastroesophageal reflux disease, esophagitis presence not specified Continue PPI/H2 blocker, diet discussed  Irritable bowel syndrome, unspecified type Diet discussed  Medicare annual wellness visit, subsequent  Over 40 minutes of exam, counseling, chart review and critical decision making was performed Future Appointments Date Time Provider [place]  07/01/2017 2:30 PM Unk Pinto, MD [facility] None  12/30/2017 2:00 PM  Unk Pinto, MD [facility] None     Plan:   During the course of the visit the patient was educated and counseled about appropriate screening and preventive services including:    Pneumococcal vaccine   Prevnar 13  Influenza vaccine  Td vaccine  Screening electrocardiogram  Bone densitometry screening  Colorectal cancer screening  Diabetes screening  Glaucoma screening  Nutrition counseling   Advanced directives: requested   Subjective:  Kelly Cantu is a 76 y.o. female who presents for Medicare Annual Wellness Visit and complete physical.    Her blood pressure has been controlled at home, today their BP is BP: 126/82  She does not workout. She denies chest pain, shortness of breath, dizziness.  She is on cholesterol medication and denies myalgias. Her cholesterol is at goal. The cholesterol last visit was:   Lab Results  Component Value Date   CHOL 157 12/24/2016   HDL 40 (L) 12/24/2016   LDLCALC 56 12/24/2016   TRIG 304 (H) 12/24/2016   CHOLHDL 3.9 12/24/2016    She has been working on diet and exercise for diabetes with CKD, now in preDM range with diet, has CKD follows with nephrology, off ACE/ARB due to kidney funciton, and denies paresthesia of the feet, polydipsia and polyuria. Last A1C in the office was:  Lab Results  Component Value Date   HGBA1C 5.7 (H) 12/24/2016   Last GFR: Lab Results  Component Value Date   GFRNONAA 27 (L) 12/24/2016   BMI is Body mass index is 31.7 kg/m., she is working on diet and exercise. Wt Readings from Last 3 Encounters:  03/28/17 181 lb 12.8 oz (82.5 kg)  01/21/17 180 lb (81.6 kg)  12/24/16  179 lb 6.4 oz (81.4 kg)   Patient is on allopurinol for gout and does not report a recent flare.  Lab Results  Component Value Date   LABURIC 4.4 12/24/2016     Medication Review: Current Outpatient Prescriptions on File Prior to Visit  Medication Sig Dispense Refill  . allopurinol (ZYLOPRIM) 300 MG tablet TAKE 1  TABLET BY MOUTH EVERY DAY 90 tablet 99  . atenolol (TENORMIN) 50 MG tablet TAKE 1 TABLET DAILY FOR BLOOD PRESSURE 90 tablet 3  . BABY ASPIRIN PO Take 81 mg by mouth daily.    . calcitRIOL (ROCALTROL) 0.25 MCG capsule Take 0.25 mcg by mouth daily.    . furosemide (LASIX) 40 MG tablet TAKE 1 TABLET DAILY FOR BLOOD PRESSURE AND FLUID 90 tablet 3  . Multiple Vitamins-Minerals (MULTIVITAMIN PO) Take by mouth daily.    . Omega-3 Fatty Acids (FISH OIL PO) Take by mouth daily.    . ranitidine (ZANTAC) 300 MG tablet Take 1 tablet 2 x/ day for Acid Indigestion & Reflux 180 tablet 1   No current facility-administered medications on file prior to visit.     Allergies  Allergen Reactions  . Ace Inhibitors     Pt unsure of reaction  . Feldene [Piroxicam] Rash    Current Problems (verified) Patient Active Problem List   Diagnosis Date Noted  . T2_ NIDDM w/ CKD 4 (GFR 28 ml/min)   ([place]) 05/28/2016  . Medicare annual wellness visit, subsequent 11/13/2015  . BMI 31.0-31.9,adult 11/11/2015  . Gout 04/11/2015  . Morbid obesity ([place]) 01/03/2015  . Vitamin D deficiency 09/29/2014  . Medication management 09/29/2014  . CKD (chronic kidney disease) stage 4, GFR 15-29 ml/min (HCC) 07/22/2014  . Hyperlipidemia   . Hypertension   . GERD (gastroesophageal reflux disease)   . DJD (degenerative joint disease)   . DDD (degenerative disc disease), lumbar   . IBS (irritable bowel syndrome)     Screening Tests Immunization History  Administered Date(s) Administered  . DT 09/29/2014, 09/20/2015  . Influenza Split 09/28/2013  . Influenza, High Dose Seasonal PF 10/13/2014, 09/03/2016  . Influenza-Unspecified 08/04/2015  . Pneumococcal Conjugate-13 10/13/2014  . Pneumococcal Polysaccharide-23 09/23/2012  . Td 09/02/2004  . Zoster 10/13/2013   Preventative care: Last colonoscopy: 2015 Last mammogram: 01/2017 Last pap smear/pelvic exam: remote  DEXA:2011 CXR 2015 US renal 2012  Prior  vaccinations: TD or Tdap: 2016 Influenza: 2017  Pneumococcal: 2013 Prevnar13: 2015 Shingles/Zostavax: 2014  Names of Other Physician/Practitioners you currently use: 1. [facility] here for primary care 2. Dr. Nicki Reaper, eye doctor, last visit 2017 3. [place], dentist, last visit 10/2016 Patient Care Team: Unk Pinto, MD as PCP - General (Internal Medicine) Mauricia Area, MD as Consulting Physician (Nephrology) Gatha Mayer, MD as Consulting Physician (Gastroenterology) Macarthur Critchley, Merlin as Referring Physician (Optometry)  SURGICAL HISTORY She  has a past surgical history that includes Abdominal hysterectomy; Cataract extraction; and Colonoscopy (x 2). FAMILY HISTORY Her family history includes Cancer in her mother and sister; Heart attack in her father; Heart disease in her father; Hypertension in her sister; Liver cancer in her mother. SOCIAL HISTORY She  reports that she quit smoking about 12 years ago. She has never used smokeless tobacco. She reports that she does not drink alcohol or use drugs.   MEDICARE WELLNESS OBJECTIVES: Physical activity: Current Exercise Habits: The patient does not participate in regular exercise at present, Exercise limited by: orthopedic condition(s) Cardiac risk factors: Cardiac Risk Factors  include: family history of premature cardiovascular disease;diabetes mellitus;dyslipidemia;advanced age (>107men, >78 women);hypertension;obesity (BMI >30kg/m2);sedentary lifestyle Depression/mood screen:   Depression screen [facility] 2/9 03/28/2017  Decreased Interest 0  Down, Depressed, Hopeless 0  PHQ - 2 Score 0    ADLs:  In your present state of health, do you have any difficulty performing the following activities: 03/28/2017 12/24/2016  Hearing? N N  Vision? N N  Difficulty concentrating or making decisions? N N  Walking or climbing stairs? N N  Dressing or bathing? N N  Doing errands, shopping? N N  Some recent data  might be hidden     Cognitive Testing  Alert? Yes  Normal Appearance?Yes  Oriented to person? Yes  Place? Yes   Time? Yes  Recall of three objects?  Yes  Can perform simple calculations? Yes  Displays appropriate judgment?Yes  Can read the correct time from a watch face?Yes  EOL planning: Does Patient Have a Medical Advance Directive?: Yes Type of Advance Directive: Healthcare Power of Attorney, Living will Copy of [place] in Chart?: No - copy requested  Review of Systems  Constitutional: Negative.   HENT: Negative.   Eyes: Negative.   Respiratory: Negative.   Cardiovascular: Negative.   Gastrointestinal: Negative.   Genitourinary: Negative.   Musculoskeletal: Negative.   Skin: Negative.   Neurological: Negative.   Endo/Heme/Allergies: Negative.   Psychiatric/Behavioral: Negative.      Objective:     Today's Vitals   03/28/17 1430  BP: 126/82  Pulse: (!) 59  Resp: 16  Temp: 97.9 F (36.6 C)  SpO2: 97%  Weight: 181 lb 12.8 oz (82.5 kg)  Height: 5' 3.5" (1.613 m)  PainSc: 0-No pain   Body mass index is 31.7 kg/m.  General appearance: alert, no distress, WD/WN, female HEENT: normocephalic, sclerae anicteric, TMs pearly, nares patent, no discharge or erythema, pharynx normal Oral cavity: MMM, no lesions Neck: supple, no lymphadenopathy, no thyromegaly, no masses Heart: RRR, normal S1, S2, no murmurs Lungs: CTA bilaterally, no wheezes, rhonchi, or rales Abdomen: +bs, soft, non tender, non distended, no masses, no hepatomegaly, no splenomegaly Musculoskeletal: nontender, no swelling, no obvious deformity Extremities: no edema, no cyanosis, no clubbing Pulses: 2+ symmetric, upper and lower extremities, normal cap refill Neurological: alert, oriented x 3, CN2-12 intact, strength normal upper extremities and lower extremities, sensation normal throughout, DTRs 2+ throughout, no cerebellar signs, gait normal Psychiatric: normal affect, behavior  normal, pleasant   Medicare Attestation I have personally reviewed: The patient's medical and social history Their use of alcohol, tobacco or illicit drugs Their current medications and supplements The patient's functional ability including ADLs,fall risks, home safety risks, cognitive, and hearing and visual impairment Diet and physical activities Evidence for depression or mood disorders  The patient's weight, height, BMI, and visual acuity have been recorded in the chart.  I have made referrals, counseling, and provided education to the patient based on review of the above and I have provided the patient with a written personalized care plan for preventive services.     Vicie Mutters, PA-C   03/28/2017

## 2017-03-28 ENCOUNTER — Ambulatory Visit: Payer: Self-pay | Admitting: Internal Medicine

## 2017-03-28 ENCOUNTER — Encounter: Payer: Self-pay | Admitting: Physician Assistant

## 2017-03-28 ENCOUNTER — Ambulatory Visit (INDEPENDENT_AMBULATORY_CARE_PROVIDER_SITE_OTHER): Payer: Medicare Other | Admitting: Physician Assistant

## 2017-03-28 ENCOUNTER — Other Ambulatory Visit: Payer: Self-pay

## 2017-03-28 VITALS — BP 126/82 | HR 59 | Temp 97.9°F | Resp 16 | Ht 63.5 in | Wt 181.8 lb

## 2017-03-28 DIAGNOSIS — M51369 Other intervertebral disc degeneration, lumbar region without mention of lumbar back pain or lower extremity pain: Secondary | ICD-10-CM

## 2017-03-28 DIAGNOSIS — R6889 Other general symptoms and signs: Secondary | ICD-10-CM | POA: Diagnosis not present

## 2017-03-28 DIAGNOSIS — K219 Gastro-esophageal reflux disease without esophagitis: Secondary | ICD-10-CM | POA: Diagnosis not present

## 2017-03-28 DIAGNOSIS — N184 Chronic kidney disease, stage 4 (severe): Secondary | ICD-10-CM

## 2017-03-28 DIAGNOSIS — E559 Vitamin D deficiency, unspecified: Secondary | ICD-10-CM

## 2017-03-28 DIAGNOSIS — Z79899 Other long term (current) drug therapy: Secondary | ICD-10-CM

## 2017-03-28 DIAGNOSIS — M150 Primary generalized (osteo)arthritis: Secondary | ICD-10-CM | POA: Diagnosis not present

## 2017-03-28 DIAGNOSIS — Z0001 Encounter for general adult medical examination with abnormal findings: Secondary | ICD-10-CM

## 2017-03-28 DIAGNOSIS — I10 Essential (primary) hypertension: Secondary | ICD-10-CM | POA: Diagnosis not present

## 2017-03-28 DIAGNOSIS — E1122 Type 2 diabetes mellitus with diabetic chronic kidney disease: Secondary | ICD-10-CM

## 2017-03-28 DIAGNOSIS — M159 Polyosteoarthritis, unspecified: Secondary | ICD-10-CM

## 2017-03-28 DIAGNOSIS — M1000 Idiopathic gout, unspecified site: Secondary | ICD-10-CM | POA: Diagnosis not present

## 2017-03-28 DIAGNOSIS — M5136 Other intervertebral disc degeneration, lumbar region: Secondary | ICD-10-CM

## 2017-03-28 DIAGNOSIS — Z0000 Encounter for general adult medical examination without abnormal findings: Secondary | ICD-10-CM

## 2017-03-28 DIAGNOSIS — E782 Mixed hyperlipidemia: Secondary | ICD-10-CM

## 2017-03-28 DIAGNOSIS — Z6831 Body mass index (BMI) 31.0-31.9, adult: Secondary | ICD-10-CM | POA: Diagnosis not present

## 2017-03-28 DIAGNOSIS — K589 Irritable bowel syndrome without diarrhea: Secondary | ICD-10-CM

## 2017-03-28 LAB — HEPATIC FUNCTION PANEL
ALT: 13 U/L (ref 6–29)
AST: 18 U/L (ref 10–35)
Albumin: 3.8 g/dL (ref 3.6–5.1)
Alkaline Phosphatase: 88 U/L (ref 33–130)
BILIRUBIN DIRECT: 0.1 mg/dL (ref <=0.2)
BILIRUBIN INDIRECT: 0.6 mg/dL (ref 0.2–1.2)
BILIRUBIN TOTAL: 0.7 mg/dL (ref 0.2–1.2)
Total Protein: 6 g/dL — ABNORMAL LOW (ref 6.1–8.1)

## 2017-03-28 LAB — CBC WITH DIFFERENTIAL/PLATELET
Basophils Absolute: 0 cells/uL (ref 0–200)
Basophils Relative: 0 %
EOS PCT: 2 %
Eosinophils Absolute: 160 cells/uL (ref 15–500)
HCT: 34.8 % — ABNORMAL LOW (ref 35.0–45.0)
HEMOGLOBIN: 11.5 g/dL — AB (ref 11.7–15.5)
LYMPHS ABS: 1760 {cells}/uL (ref 850–3900)
Lymphocytes Relative: 22 %
MCH: 29.9 pg (ref 27.0–33.0)
MCHC: 33 g/dL (ref 32.0–36.0)
MCV: 90.6 fL (ref 80.0–100.0)
MONOS PCT: 9 %
MPV: 9.4 fL (ref 7.5–12.5)
Monocytes Absolute: 720 cells/uL (ref 200–950)
NEUTROS ABS: 5360 {cells}/uL (ref 1500–7800)
Neutrophils Relative %: 67 %
PLATELETS: 250 10*3/uL (ref 140–400)
RBC: 3.84 MIL/uL (ref 3.80–5.10)
RDW: 15.4 % — ABNORMAL HIGH (ref 11.0–15.0)
WBC: 8 10*3/uL (ref 3.8–10.8)

## 2017-03-28 LAB — BASIC METABOLIC PANEL WITH GFR
BUN: 39 mg/dL — ABNORMAL HIGH (ref 7–25)
CHLORIDE: 102 mmol/L (ref 98–110)
CO2: 22 mmol/L (ref 20–31)
CREATININE: 1.72 mg/dL — AB (ref 0.60–0.93)
Calcium: 9.5 mg/dL (ref 8.6–10.4)
GFR, EST NON AFRICAN AMERICAN: 29 mL/min — AB (ref >=60)
GFR, Est African American: 33 mL/min — ABNORMAL LOW (ref >=60)
Glucose, Bld: 106 mg/dL — ABNORMAL HIGH (ref 65–99)
Potassium: 3.9 mmol/L (ref 3.5–5.3)
SODIUM: 139 mmol/L (ref 135–146)

## 2017-03-28 LAB — LIPID PANEL
CHOL/HDL RATIO: 3.7 ratio (ref <5.0)
CHOLESTEROL: 145 mg/dL (ref <200)
HDL: 39 mg/dL — AB (ref >50)
LDL Cholesterol: 67 mg/dL (ref <100)
Triglycerides: 196 mg/dL — ABNORMAL HIGH (ref <150)
VLDL: 39 mg/dL — AB (ref <30)

## 2017-03-28 MED ORDER — ATORVASTATIN CALCIUM 20 MG PO TABS: ORAL_TABLET | ORAL | 1 refills | Status: DC

## 2017-03-28 NOTE — Patient Instructions (Signed)

## 2017-03-29 LAB — HEMOGLOBIN A1C
HEMOGLOBIN A1C: 5.7 % — AB (ref <5.7)
MEAN PLASMA GLUCOSE: 117 mg/dL

## 2017-03-29 LAB — TSH: TSH: 1.7 m[IU]/L

## 2017-03-29 LAB — MAGNESIUM: MAGNESIUM: 1.8 mg/dL (ref 1.5–2.5)

## 2017-03-29 NOTE — Progress Notes (Signed)
Pt aware of lab results & voiced understanding of those results.

## 2017-04-27 ENCOUNTER — Other Ambulatory Visit: Payer: Self-pay | Admitting: Internal Medicine

## 2017-07-01 ENCOUNTER — Encounter: Payer: Self-pay | Admitting: Internal Medicine

## 2017-07-01 ENCOUNTER — Ambulatory Visit (INDEPENDENT_AMBULATORY_CARE_PROVIDER_SITE_OTHER): Payer: Medicare Other | Admitting: Internal Medicine

## 2017-07-01 VITALS — BP 132/68 | HR 59 | Temp 97.5°F | Resp 16 | Ht 63.5 in | Wt 178.6 lb

## 2017-07-01 DIAGNOSIS — E1122 Type 2 diabetes mellitus with diabetic chronic kidney disease: Secondary | ICD-10-CM | POA: Diagnosis not present

## 2017-07-01 DIAGNOSIS — N184 Chronic kidney disease, stage 4 (severe): Secondary | ICD-10-CM | POA: Diagnosis not present

## 2017-07-01 DIAGNOSIS — E559 Vitamin D deficiency, unspecified: Secondary | ICD-10-CM

## 2017-07-01 DIAGNOSIS — E782 Mixed hyperlipidemia: Secondary | ICD-10-CM | POA: Diagnosis not present

## 2017-07-01 DIAGNOSIS — I10 Essential (primary) hypertension: Secondary | ICD-10-CM | POA: Diagnosis not present

## 2017-07-01 DIAGNOSIS — Z79899 Other long term (current) drug therapy: Secondary | ICD-10-CM | POA: Diagnosis not present

## 2017-07-01 DIAGNOSIS — M1000 Idiopathic gout, unspecified site: Secondary | ICD-10-CM

## 2017-07-01 LAB — CBC WITH DIFFERENTIAL/PLATELET
BASOS ABS: 0 {cells}/uL (ref 0–200)
Basophils Relative: 0 %
EOS ABS: 172 {cells}/uL (ref 15–500)
Eosinophils Relative: 2 %
HCT: 36.7 % (ref 35.0–45.0)
Hemoglobin: 12.2 g/dL (ref 11.7–15.5)
LYMPHS PCT: 25 %
Lymphs Abs: 2150 cells/uL (ref 850–3900)
MCH: 30.3 pg (ref 27.0–33.0)
MCHC: 33.2 g/dL (ref 32.0–36.0)
MCV: 91.3 fL (ref 80.0–100.0)
MONOS PCT: 8 %
MPV: 10.9 fL (ref 7.5–12.5)
Monocytes Absolute: 688 cells/uL (ref 200–950)
NEUTROS ABS: 5590 {cells}/uL (ref 1500–7800)
NEUTROS PCT: 65 %
PLATELETS: 278 10*3/uL (ref 140–400)
RBC: 4.02 MIL/uL (ref 3.80–5.10)
RDW: 14.8 % (ref 11.0–15.0)
WBC: 8.6 10*3/uL (ref 3.8–10.8)

## 2017-07-01 NOTE — Progress Notes (Signed)
This very nice 76 y.o.  [name] presents for 3 month follow up with Hypertension, Hyperlipidemia, T2_NIDDM and Vitamin D Deficiency. Her GERD and gout are controlled .      Patient is treated for HTN (1991) & BP has been controlled at home. Today's BP is at goal - 132/68. Patient has had no complaints of any cardiac type chest pain, palpitations, dyspnea/orthopnea/PND, dizziness, claudication, or dependent edema.     Hyperlipidemia is controlled with diet & meds. Patient denies myalgias or other med SE's. Last Lipids were  Lab Results  Component Value Date   CHOL 145 03/28/2017   HDL 39 (L) 03/28/2017   LDLCALC 67 03/28/2017   TRIG 196 (H) 03/28/2017   CHOLHDL 3.7 03/28/2017      Also, the patient has history of Morbid Obesity  And PreDiabetes (A1c 6.0% in 2014) and then T2_NIDDM (A1c 6.6% in Mar 2017). She has had no symptoms of reactive hypoglycemia, diabetic polys, paresthesias or visual blurring.  Last A1c was 5.7% in Apr 2018.      She also has CKD4 attributed to her HTN & DM and was hospitalized in 2012 with ARF and recovered to her current baseline and is followed by Dr Deterding.      Further, the patient also has history of Vitamin D Deficiency and supplements vitamin D without any suspected side-effects. Last vitamin D was still low: Lab Results  Component Value Date   VD25OH 42 12/24/2016   Current Outpatient Prescriptions on File Prior to Visit  Medication Sig  . allopurinol (ZYLOPRIM) 300 MG tablet TAKE 1 TABLET BY MOUTH EVERY DAY  . atenolol (TENORMIN) 50 MG tablet TAKE 1 TABLET DAILY FOR BLOOD PRESSURE  . atorvastatin (LIPITOR) 20 MG tablet TAKE 1 TABLET DAILY AS DIRECTED FOR CHOLESTEROL  . BABY ASPIRIN PO Take 81 mg by mouth daily.  . calcitRIOL (ROCALTROL) 0.25 MCG capsule Take 0.25 mcg by mouth daily.  . furosemide (LASIX) 40 MG tablet TAKE 1 TABLET DAILY FOR BLOOD PRESSURE AND FLUID  . Multiple Vitamins-Minerals (MULTIVITAMIN PO) Take by mouth daily.  . Omega-3  Fatty Acids (FISH OIL PO) Take by mouth daily.  . ranitidine (ZANTAC) 300 MG tablet TAKE 1 TABLET TWICE A DAY FOR ACID INDIGESTION AND REFLUX   No current facility-administered medications on file prior to visit.    Allergies  Allergen Reactions  . Ace Inhibitors     Pt unsure of reaction  . Feldene [Piroxicam] Rash   PMHx:   Past Medical History:  Diagnosis Date  . Cataract   . Chronic kidney disease   . DDD (degenerative disc disease), lumbar   . DJD (degenerative joint disease)   . GERD (gastroesophageal reflux disease)   . Hyperlipidemia   . Hypertension   . IBS (irritable bowel syndrome)   . Prediabetes    Immunization History  Administered Date(s) Administered  . DT 09/29/2014, 09/20/2015  . Influenza Split 09/28/2013  . Influenza, High Dose Seasonal PF 10/13/2014, 09/03/2016  . Influenza-Unspecified 08/04/2015  . Pneumococcal Conjugate-13 10/13/2014  . Pneumococcal Polysaccharide-23 09/23/2012  . Td 09/02/2004  . Zoster 10/13/2013   Past Surgical History:  Procedure Laterality Date  . ABDOMINAL HYSTERECTOMY    . CATARACT EXTRACTION    . COLONOSCOPY  x 2   w/Dr Carlean Purl   FHx:    Reviewed / unchanged  SHx:    Reviewed / unchanged  Systems Review:  Constitutional: Denies fever, chills, wt changes, headaches, insomnia, fatigue, night sweats,  change in appetite. Eyes: Denies redness, blurred vision, diplopia, discharge, itchy, watery eyes.  ENT: Denies discharge, congestion, post nasal drip, epistaxis, sore throat, earache, hearing loss, dental pain, tinnitus, vertigo, sinus pain, snoring.  CV: Denies chest pain, palpitations, irregular heartbeat, syncope, dyspnea, diaphoresis, orthopnea, PND, claudication or edema. Respiratory: denies cough, dyspnea, DOE, pleurisy, hoarseness, laryngitis, wheezing.  Gastrointestinal: Denies dysphagia, odynophagia, heartburn, reflux, water brash, abdominal pain or cramps, nausea, vomiting, bloating, diarrhea, constipation,  hematemesis, melena, hematochezia  or hemorrhoids. Genitourinary: Denies dysuria, frequency, urgency, nocturia, hesitancy, discharge, hematuria or flank pain. Musculoskeletal: Denies arthralgias, myalgias, stiffness, jt. swelling, pain, limping or strain/sprain.  Skin: Denies pruritus, rash, hives, warts, acne, eczema or change in skin lesion(s). Neuro: No weakness, tremor, incoordination, spasms, paresthesia or pain. Psychiatric: Denies confusion, memory loss or sensory loss. Endo: Denies change in weight, skin or hair change.  Heme/Lymph: No excessive bleeding, bruising or enlarged lymph nodes.  Physical Exam  BP 132/68   Pulse (!) 59   Temp (!) 97.5 F (36.4 C)   Resp 16   Ht 5' 3.5" (1.613 m)   Wt 178 lb 9.6 oz (81 kg)   BMI 31.14 kg/m   Appears well nourished, well groomed  and in no distress.  Eyes: PERRLA, EOMs, conjunctiva no swelling or erythema. Sinuses: No frontal/maxillary tenderness ENT/Mouth: EAC's clear, TM's nl w/o erythema, bulging. Nares clear w/o erythema, swelling, exudates. Oropharynx clear without erythema or exudates. Oral hygiene is good. Tongue normal, non obstructing. Hearing intact.  Neck: Supple. Thyroid nl. Car 2+/2+ without bruits, nodes or JVD. Chest: Respirations nl with BS clear & equal w/o rales, rhonchi, wheezing or stridor.  Cor: Heart sounds normal w/ regular rate and rhythm without sig. murmurs, gallops, clicks or rubs. Peripheral pulses normal and equal  without edema.  Abdomen: Soft & bowel sounds normal. Non-tender w/o guarding, rebound, hernias, masses or organomegaly.  Lymphatics: Unremarkable.  Musculoskeletal: Full ROM all peripheral extremities, joint stability, 5/5 strength and normal gait.  Skin: Warm, dry without exposed rashes, lesions or ecchymosis apparent.  Neuro: Cranial nerves intact, reflexes equal bilaterally. Sensory-motor testing grossly intact. Tendon reflexes grossly intact.  Pysch: Alert & oriented x 3.  Insight and  judgement nl & appropriate. No ideations.  Assessment and Plan:    1. Essential hypertension  - Continue medication, monitor blood pressure at home.  - Continue DASH diet. Reminder to go to the ER if any CP,  SOB, nausea, dizziness, severe HA, changes vision/speech. - CBC with Differential/Platelet - BASIC METABOLIC PANEL WITH GFR - Magnesium - TSH  2. Hyperlipidemia, mixed  - Continue diet/meds, exercise,& lifestyle modifications.  - Continue monitor periodic cholesterol/liver & renal functions  - Hepatic function panel - Lipid panel - TSH  3. Type 2 diabetes mellitus with stage 4 chronic kidney disease, without long-term current use of insulin (HCC)  - Continue diet, exercise, lifestyle modifications.  - Monitor appropriate labs.  - Hemoglobin A1c - Insulin, random  4. Vitamin D deficiency  - Continue supplementation.  - VITAMIN D 25 Hydroxy   5. Idiopathic gout  - Uric acid  6. Medication management  - CBC with Differential/Platelet - BASIC METABOLIC PANEL WITH GFR - Hepatic function panel - Magnesium - Lipid panel - TSH - Hemoglobin A1c - Insulin, random - VITAMIN D 25 Hydroxy - Uric acid       Discussed  regular exercise, BP monitoring, weight control to achieve/maintain BMI less than 25 and discussed med and SE's. Recommended labs to assess and  monitor clinical status with further disposition pending results of labs. Over 30 minutes of exam, counseling, chart review was performed.

## 2017-07-01 NOTE — Patient Instructions (Signed)

## 2017-07-02 LAB — BASIC METABOLIC PANEL WITH GFR
BUN: 34 mg/dL — AB (ref 7–25)
CO2: 25 mmol/L (ref 20–31)
Calcium: 9.6 mg/dL (ref 8.6–10.4)
Chloride: 100 mmol/L (ref 98–110)
Creat: 1.65 mg/dL — ABNORMAL HIGH (ref 0.60–0.93)
GFR, EST AFRICAN AMERICAN: 35 mL/min — AB (ref >=60)
GFR, EST NON AFRICAN AMERICAN: 30 mL/min — AB (ref >=60)
Glucose, Bld: 102 mg/dL — ABNORMAL HIGH (ref 65–99)
POTASSIUM: 4 mmol/L (ref 3.5–5.3)
Sodium: 138 mmol/L (ref 135–146)

## 2017-07-02 LAB — HEMOGLOBIN A1C
Hgb A1c MFr Bld: 5.8 % — ABNORMAL HIGH (ref <5.7)
Mean Plasma Glucose: 120 mg/dL

## 2017-07-02 LAB — LIPID PANEL
Cholesterol: 150 mg/dL (ref <200)
HDL: 42 mg/dL — ABNORMAL LOW (ref >50)
LDL CALC: 65 mg/dL (ref <100)
TRIGLYCERIDES: 215 mg/dL — AB (ref <150)
Total CHOL/HDL Ratio: 3.6 Ratio (ref <5.0)
VLDL: 43 mg/dL — ABNORMAL HIGH (ref <30)

## 2017-07-02 LAB — MAGNESIUM: Magnesium: 1.8 mg/dL (ref 1.5–2.5)

## 2017-07-02 LAB — TSH: TSH: 2.13 mIU/L

## 2017-07-02 LAB — HEPATIC FUNCTION PANEL
ALBUMIN: 4.1 g/dL (ref 3.6–5.1)
ALK PHOS: 99 U/L (ref 33–130)
ALT: 15 U/L (ref 6–29)
AST: 20 U/L (ref 10–35)
BILIRUBIN DIRECT: 0.1 mg/dL (ref <=0.2)
Indirect Bilirubin: 0.7 mg/dL (ref 0.2–1.2)
Total Bilirubin: 0.8 mg/dL (ref 0.2–1.2)
Total Protein: 6.1 g/dL (ref 6.1–8.1)

## 2017-07-02 LAB — INSULIN, RANDOM: INSULIN: 15.3 u[IU]/mL (ref 2.0–19.6)

## 2017-07-02 LAB — VITAMIN D 25 HYDROXY (VIT D DEFICIENCY, FRACTURES): Vit D, 25-Hydroxy: 44 ng/mL (ref 30–100)

## 2017-07-02 LAB — URIC ACID: Uric Acid, Serum: 4 mg/dL (ref 2.5–7.0)

## 2017-07-09 DIAGNOSIS — Z789 Other specified health status: Secondary | ICD-10-CM | POA: Diagnosis not present

## 2017-07-09 DIAGNOSIS — Z723 Lack of physical exercise: Secondary | ICD-10-CM | POA: Diagnosis not present

## 2017-07-09 DIAGNOSIS — N2581 Secondary hyperparathyroidism of renal origin: Secondary | ICD-10-CM | POA: Diagnosis not present

## 2017-07-09 DIAGNOSIS — K219 Gastro-esophageal reflux disease without esophagitis: Secondary | ICD-10-CM | POA: Diagnosis not present

## 2017-07-09 DIAGNOSIS — M10379 Gout due to renal impairment, unspecified ankle and foot: Secondary | ICD-10-CM | POA: Diagnosis not present

## 2017-07-09 DIAGNOSIS — N183 Chronic kidney disease, stage 3 (moderate): Secondary | ICD-10-CM | POA: Diagnosis not present

## 2017-07-09 DIAGNOSIS — M1990 Unspecified osteoarthritis, unspecified site: Secondary | ICD-10-CM | POA: Diagnosis not present

## 2017-07-09 DIAGNOSIS — E669 Obesity, unspecified: Secondary | ICD-10-CM | POA: Diagnosis not present

## 2017-07-09 DIAGNOSIS — I129 Hypertensive chronic kidney disease with stage 1 through stage 4 chronic kidney disease, or unspecified chronic kidney disease: Secondary | ICD-10-CM | POA: Diagnosis not present

## 2017-09-30 NOTE — Progress Notes (Signed)
Assessment and Plan:  Hypertension:  -Continue medication,  -monitor blood pressure at home.  -Continue DASH diet.   -Reminder to go to the ER if any CP, SOB, nausea, dizziness, severe HA, changes vision/speech, left arm numbness and tingling, and jaw pain.  Cholesterol: -Continue diet and exercise.  -Check cholesterol.   Pre-diabetes: -Continue diet and exercise.  -Check A1C  Vitamin D Def: -check level -continue medications.   Morbid Obesity with co morbidities - long discussion about weight loss, diet, and exercise  Continue diet and meds as discussed. Further disposition pending results of labs.  HPI 76 y.o. female  presents for 3 month follow up with hypertension, hyperlipidemia, prediabetes and vitamin D.   Her blood pressure has been controlled at home, today their BP is BP: 130/82.   She does workout. She denies chest pain, shortness of breath, dizziness.   She is on cholesterol medication and denies myalgias. Her cholesterol is at goal. The cholesterol last visit was:   Lab Results  Component Value Date   CHOL 150 07/01/2017   HDL 42 (L) 07/01/2017   LDLCALC 65 07/01/2017   TRIG 215 (H) 07/01/2017   CHOLHDL 3.6 07/01/2017    She has been working on diet and exercise for diabetes with CKD, last visit she was in the DM range but she has gotten it down with diet, and denies foot ulcerations, hyperglycemia, hypoglycemia , increased appetite, nausea, paresthesia of the feet, polydipsia, polyuria, visual disturbances, vomiting and weight loss. Last A1C in the office was:  Lab Results  Component Value Date   HGBA1C 5.8 (H) 07/01/2017   She has CKD due to HTN/DM2 Lab Results  Component Value Date   GFRNONAA 30 (L) 07/01/2017   Patient is on Vitamin D supplement.  Lab Results  Component Value Date   VD25OH 44 07/01/2017     BMI is Body mass index is 30.9 kg/m., she is working on diet and exercise. Wt Readings from Last 3 Encounters:  10/01/17 177 lb 3.2 oz  (80.4 kg)  07/01/17 178 lb 9.6 oz (81 kg)  03/28/17 181 lb 12.8 oz (82.5 kg)   Patient is on allopurinol for gout and does not report a recent flare but has been complain of bilateral feet pain.  Lab Results  Component Value Date   LABURIC 4.0 07/01/2017   Current Medications:  Current Outpatient Prescriptions on File Prior to Visit  Medication Sig Dispense Refill  . allopurinol (ZYLOPRIM) 300 MG tablet TAKE 1 TABLET BY MOUTH EVERY DAY 90 tablet 99  . atenolol (TENORMIN) 50 MG tablet TAKE 1 TABLET DAILY FOR BLOOD PRESSURE 90 tablet 3  . atorvastatin (LIPITOR) 20 MG tablet TAKE 1 TABLET DAILY AS DIRECTED FOR CHOLESTEROL 90 tablet 1  . BABY ASPIRIN PO Take 81 mg by mouth daily.    . calcitRIOL (ROCALTROL) 0.25 MCG capsule Take 0.25 mcg by mouth daily.    . furosemide (LASIX) 40 MG tablet TAKE 1 TABLET DAILY FOR BLOOD PRESSURE AND FLUID 90 tablet 3  . Multiple Vitamins-Minerals (MULTIVITAMIN PO) Take by mouth daily.    . Omega-3 Fatty Acids (FISH OIL PO) Take by mouth daily.    . ranitidine (ZANTAC) 300 MG tablet TAKE 1 TABLET TWICE A DAY FOR ACID INDIGESTION AND REFLUX 180 tablet 1   No current facility-administered medications on file prior to visit.     Medical History:  Past Medical History:  Diagnosis Date  . Cataract   . Chronic kidney disease   .  DDD (degenerative disc disease), lumbar   . DJD (degenerative joint disease)   . GERD (gastroesophageal reflux disease)   . Hyperlipidemia   . Hypertension   . IBS (irritable bowel syndrome)   . Prediabetes     Allergies:  Allergies  Allergen Reactions  . Ace Inhibitors     Pt unsure of reaction  . Feldene [Piroxicam] Rash     Review of Systems:  Review of Systems  Constitutional: Negative for chills, fever and malaise/fatigue.  HENT: Negative for congestion, ear pain and sore throat.   Eyes: Negative.   Respiratory: Negative for cough, shortness of breath and wheezing.   Cardiovascular: Negative for chest pain,  palpitations and leg swelling.  Gastrointestinal: Negative for blood in stool, constipation, diarrhea, heartburn and melena.  Genitourinary: Negative.   Neurological: Negative for dizziness, loss of consciousness and headaches.  Psychiatric/Behavioral: Negative for depression. The patient is not nervous/anxious and does not have insomnia.     Family history- Review and unchanged  Social history- Review and unchanged  Physical Exam: BP 130/82   Pulse 60   Temp (!) 96.3 F (35.7 C)   Ht 5' 3.5" (1.613 m)   Wt 177 lb 3.2 oz (80.4 kg)   SpO2 97%   BMI 30.90 kg/m  Wt Readings from Last 3 Encounters:  10/01/17 177 lb 3.2 oz (80.4 kg)  07/01/17 178 lb 9.6 oz (81 kg)  03/28/17 181 lb 12.8 oz (82.5 kg)   General Appearance: Well nourished well developed, in no apparent distress. Eyes: PERRLA, EOMs, conjunctiva no swelling or erythema ENT/Mouth: Ear canals normal without obstruction, swelling, erythma, discharge.  TMs normal bilaterally.  Oropharynx moist, clear, without exudate, or postoropharyngeal swelling. Neck: Supple, thyroid normal,no cervical adenopathy  Respiratory: Respiratory effort normal, Breath sounds clear A&P without rhonchi, wheeze, or rale.  No retractions, no accessory usage. Cardio: RRR with no MRGs. Brisk peripheral pulses without edema.  Abdomen: Soft, + BS,  Non tender, no guarding, rebound, hernias, masses. Musculoskeletal: Full ROM, 5/5 strength, Normal gait Skin: Warm, dry without rashes, lesions, ecchymosis.  Neuro: Awake and oriented X 3, Cranial nerves intact. Normal muscle tone, no cerebellar symptoms. Psych: Normal affect, Insight and Judgment appropriate.    Vicie Mutters, PA-C 3:32 PM [facility]

## 2017-10-01 ENCOUNTER — Ambulatory Visit (INDEPENDENT_AMBULATORY_CARE_PROVIDER_SITE_OTHER): Payer: Medicare Other | Admitting: Physician Assistant

## 2017-10-01 ENCOUNTER — Encounter: Payer: Self-pay | Admitting: Physician Assistant

## 2017-10-01 VITALS — BP 130/82 | HR 60 | Temp 96.3°F | Ht 63.5 in | Wt 177.2 lb

## 2017-10-01 DIAGNOSIS — E782 Mixed hyperlipidemia: Secondary | ICD-10-CM | POA: Diagnosis not present

## 2017-10-01 DIAGNOSIS — Z79899 Other long term (current) drug therapy: Secondary | ICD-10-CM

## 2017-10-01 DIAGNOSIS — I10 Essential (primary) hypertension: Secondary | ICD-10-CM

## 2017-10-01 DIAGNOSIS — N184 Chronic kidney disease, stage 4 (severe): Secondary | ICD-10-CM

## 2017-10-01 DIAGNOSIS — E1122 Type 2 diabetes mellitus with diabetic chronic kidney disease: Secondary | ICD-10-CM | POA: Diagnosis not present

## 2017-10-01 DIAGNOSIS — Z23 Encounter for immunization: Secondary | ICD-10-CM | POA: Diagnosis not present

## 2017-10-01 NOTE — Patient Instructions (Signed)
Can do vionics Stretch your foot at night with belt   Flat Feet, Adult Normally, a foot has a curve, called an arch, on its inner side. The arch creates a gap between the foot and the ground. Flat feet is a common condition in which one or both feet do not have an arch. What are the causes? This condition may be caused by:  Failure of a normal arch to develop during childhood.  An injury to tendons and ligaments in the foot, such as to the tendon that supports the arch (posterior tibial tendon).  Loose tendons or ligaments in the foot.  A wearing down of the arch over time.  Injury to bones in the foot.  An abnormality in the bones of the foot, called tarsal coalition. This happens when two or more bones in the foot are joined together (fused) before birth.  What increases the risk? This condition is more likely to develop in:  Females.  Adults age 87 or older.  People who: ? Have a family history of flat feet. ? Have a history of childhood flexible flatfoot. ? Are obese. ? Have diabetes. ? Have high blood pressure. ? Participate in high-impact sports. ? Have inflammatory arthritis. ? Have a history of broken (fractured) or dislocated bones in the foot.  What are the signs or symptoms? Symptoms of this condition include:  Pain or tightness along the bottom of the foot.  Foot pain that gets worse with activity.  Swelling of the inner side of the foot.  Swelling of the ankle.  Pain on the outer side of the ankle.  Changes in the way that you walk (gait).  Pronation. This is when the foot and ankle lean inward when you are standing.  Bony bumps on the top or inner side of the foot.  How is this diagnosed? This condition is diagnosed with a physical exam of your foot and ankle. Your health care provider may also:  Look at your shoes for patterns of wear on the soles.  Order imaging tests, such as X-rays, a CT scan, or an MRI.  Refer you to a health care  provider who specializes in feet (podiatrist) or a physical therapist.  How is this treated? This condition may be treated with:  Stretching exercises or physical therapy. This helps to increase range of motion and relieve pain.  A shoe insert (orthotic). This helps to support the arch of your foot. Orthotics can be purchased from a store or can be custom-made by your health care provider.  Wearing shoes with appropriate arch support. This is especially important for athletes.  Medicines. These may be prescribed to relieve pain.  An ankle brace, boot, or cast. These may be used to relieve pressure on your foot. You may be given crutches if walking is painful.  Surgery. This may be done to improve the alignment of your foot. This is only needed if your posterior tibial tendon is torn or if you have tarsal coalition.  Follow these instructions at home: Activity  Do any exercises as told by your health care provider.  If an activity causes pain, avoid it or try to find another activity that does not cause pain. General instructions  Wear orthotics and appropriate shoes as told by your health care provider.  Take over-the-counter and prescription medicines only as told by your health care provider.  Wear an ankle brace, boot, or cast as told by your health care provider.  Use crutches as told  by your health care provider.  Keep all follow-up visits as told by your health care provider. This is important. How is this prevented? To prevent the condition from getting worse:  Wear comfortable, supportive shoes that are appropriate for your activities.  Maintain a healthy weight.  Stay active in a way that your health care provider recommends. This will help to keep your feet flexible and strong.  Manage long-term (chronic) health conditions, such as diabetes, high blood pressure, and inflammatory arthritis.  Work with a health care provider if you have concerns about your feet or  shoes.  Contact a health care provider if:  You have pain in your foot or lower leg that gets worse or does not improve with medicine.  You have pain or difficulty when walking.  You have problems with your orthotics. Summary  Flat feet is a common condition in which one or both feet do not have a curve, called an arch, on the inner side.  Your health care provider may recommend a shoe insert (orthotic) or shoes with the appropriate arch support.  Other treatments may include stretching exercises or physical therapy, medicines to relieve pain, and wearing an ankle brace, boot, or cast.  Surgery may be done if you have a tear in the tendon that supports your arch (posterior tibial tendon) or if two or more of your foot bones were joined together (fused)  before birth (tarsal coalition). This information is not intended to replace advice given to you by your health care provider. Make sure you discuss any questions you have with your health care provider. Document Released: 09/16/2009 Document Revised: 01/30/2017 Document Reviewed: 01/30/2017 Elsevier Interactive Patient Education  [employer].

## 2017-10-02 LAB — BASIC METABOLIC PANEL WITH GFR
BUN/Creatinine Ratio: 23 (calc) — ABNORMAL HIGH (ref 6–22)
BUN: 39 mg/dL — ABNORMAL HIGH (ref 7–25)
CO2: 29 mmol/L (ref 20–32)
CREATININE: 1.71 mg/dL — AB (ref 0.60–0.93)
Calcium: 10.2 mg/dL (ref 8.6–10.4)
Chloride: 102 mmol/L (ref 98–110)
GFR, EST NON AFRICAN AMERICAN: 29 mL/min/{1.73_m2} — AB (ref >=60)
GFR, Est African American: 33 mL/min/{1.73_m2} — ABNORMAL LOW (ref >=60)
Glucose, Bld: 97 mg/dL (ref 65–99)
Potassium: 4.1 mmol/L (ref 3.5–5.3)
SODIUM: 142 mmol/L (ref 135–146)

## 2017-10-02 LAB — CBC WITH DIFFERENTIAL/PLATELET
BASOS ABS: 36 {cells}/uL (ref 0–200)
BASOS PCT: 0.4 %
EOS ABS: 153 {cells}/uL (ref 15–500)
Eosinophils Relative: 1.7 %
HCT: 35.1 % (ref 35.0–45.0)
HEMOGLOBIN: 12.1 g/dL (ref 11.7–15.5)
Lymphs Abs: 2178 cells/uL (ref 850–3900)
MCH: 30.7 pg (ref 27.0–33.0)
MCHC: 34.5 g/dL (ref 32.0–36.0)
MCV: 89.1 fL (ref 80.0–100.0)
MPV: 10.5 fL (ref 7.5–12.5)
Monocytes Relative: 8.6 %
NEUTROS ABS: 5859 {cells}/uL (ref 1500–7800)
Neutrophils Relative %: 65.1 %
Platelets: 278 10*3/uL (ref 140–400)
RBC: 3.94 10*6/uL (ref 3.80–5.10)
RDW: 13.9 % (ref 11.0–15.0)
Total Lymphocyte: 24.2 %
WBC: 9 10*3/uL (ref 3.8–10.8)
WBCMIX: 774 {cells}/uL (ref 200–950)

## 2017-10-02 LAB — HEPATIC FUNCTION PANEL
AG RATIO: 1.7 (calc) (ref 1.0–2.5)
ALKALINE PHOSPHATASE (APISO): 105 U/L (ref 33–130)
ALT: 17 U/L (ref 6–29)
AST: 22 U/L (ref 10–35)
Albumin: 4.2 g/dL (ref 3.6–5.1)
BILIRUBIN TOTAL: 0.8 mg/dL (ref 0.2–1.2)
Bilirubin, Direct: 0.2 mg/dL (ref 0.0–0.2)
Globulin: 2.5 g/dL (calc) (ref 1.9–3.7)
Indirect Bilirubin: 0.6 mg/dL (calc) (ref 0.2–1.2)
Total Protein: 6.7 g/dL (ref 6.1–8.1)

## 2017-10-02 LAB — HEMOGLOBIN A1C
HEMOGLOBIN A1C: 5.8 %{Hb} — AB (ref <5.7)
Mean Plasma Glucose: 120 (calc)
eAG (mmol/L): 6.6 (calc)

## 2017-10-02 LAB — LIPID PANEL
Cholesterol: 175 mg/dL (ref <200)
HDL: 51 mg/dL (ref >50)
LDL CHOLESTEROL (CALC): 94 mg/dL
NON-HDL CHOLESTEROL (CALC): 124 mg/dL (ref <130)
TRIGLYCERIDES: 206 mg/dL — AB (ref <150)
Total CHOL/HDL Ratio: 3.4 (calc) (ref <5.0)

## 2017-10-02 LAB — TSH: TSH: 1.44 mIU/L (ref 0.40–4.50)

## 2017-10-24 ENCOUNTER — Other Ambulatory Visit: Payer: Self-pay | Admitting: Internal Medicine

## 2017-10-30 ENCOUNTER — Other Ambulatory Visit: Payer: Self-pay | Admitting: Physician Assistant

## 2017-12-05 DIAGNOSIS — K219 Gastro-esophageal reflux disease without esophagitis: Secondary | ICD-10-CM | POA: Diagnosis not present

## 2017-12-05 DIAGNOSIS — E782 Mixed hyperlipidemia: Secondary | ICD-10-CM | POA: Diagnosis not present

## 2017-12-05 DIAGNOSIS — M10379 Gout due to renal impairment, unspecified ankle and foot: Secondary | ICD-10-CM | POA: Diagnosis not present

## 2017-12-05 DIAGNOSIS — M1990 Unspecified osteoarthritis, unspecified site: Secondary | ICD-10-CM | POA: Diagnosis not present

## 2017-12-05 DIAGNOSIS — Z789 Other specified health status: Secondary | ICD-10-CM | POA: Diagnosis not present

## 2017-12-05 DIAGNOSIS — N183 Chronic kidney disease, stage 3 (moderate): Secondary | ICD-10-CM | POA: Diagnosis not present

## 2017-12-05 DIAGNOSIS — I129 Hypertensive chronic kidney disease with stage 1 through stage 4 chronic kidney disease, or unspecified chronic kidney disease: Secondary | ICD-10-CM | POA: Diagnosis not present

## 2017-12-05 DIAGNOSIS — N2581 Secondary hyperparathyroidism of renal origin: Secondary | ICD-10-CM | POA: Diagnosis not present

## 2017-12-05 DIAGNOSIS — E669 Obesity, unspecified: Secondary | ICD-10-CM | POA: Diagnosis not present

## 2017-12-30 ENCOUNTER — Encounter: Payer: Self-pay | Admitting: Internal Medicine

## 2017-12-30 ENCOUNTER — Other Ambulatory Visit: Payer: Self-pay | Admitting: Internal Medicine

## 2017-12-30 DIAGNOSIS — Z139 Encounter for screening, unspecified: Secondary | ICD-10-CM

## 2018-01-06 ENCOUNTER — Ambulatory Visit (INDEPENDENT_AMBULATORY_CARE_PROVIDER_SITE_OTHER): Payer: Medicare Other | Admitting: Internal Medicine

## 2018-01-06 ENCOUNTER — Encounter: Payer: Self-pay | Admitting: Internal Medicine

## 2018-01-06 VITALS — BP 120/64 | HR 60 | Temp 97.3°F | Resp 16 | Ht 63.0 in | Wt 177.0 lb

## 2018-01-06 DIAGNOSIS — K219 Gastro-esophageal reflux disease without esophagitis: Secondary | ICD-10-CM | POA: Diagnosis not present

## 2018-01-06 DIAGNOSIS — E1122 Type 2 diabetes mellitus with diabetic chronic kidney disease: Secondary | ICD-10-CM | POA: Diagnosis not present

## 2018-01-06 DIAGNOSIS — E559 Vitamin D deficiency, unspecified: Secondary | ICD-10-CM

## 2018-01-06 DIAGNOSIS — Z136 Encounter for screening for cardiovascular disorders: Secondary | ICD-10-CM | POA: Diagnosis not present

## 2018-01-06 DIAGNOSIS — N184 Chronic kidney disease, stage 4 (severe): Secondary | ICD-10-CM | POA: Diagnosis not present

## 2018-01-06 DIAGNOSIS — I10 Essential (primary) hypertension: Secondary | ICD-10-CM | POA: Diagnosis not present

## 2018-01-06 DIAGNOSIS — Z79899 Other long term (current) drug therapy: Secondary | ICD-10-CM | POA: Diagnosis not present

## 2018-01-06 DIAGNOSIS — E782 Mixed hyperlipidemia: Secondary | ICD-10-CM

## 2018-01-06 DIAGNOSIS — M1000 Idiopathic gout, unspecified site: Secondary | ICD-10-CM | POA: Diagnosis not present

## 2018-01-06 DIAGNOSIS — Z1211 Encounter for screening for malignant neoplasm of colon: Secondary | ICD-10-CM

## 2018-01-06 DIAGNOSIS — Z1212 Encounter for screening for malignant neoplasm of rectum: Secondary | ICD-10-CM

## 2018-01-06 NOTE — Patient Instructions (Signed)

## 2018-01-06 NOTE — Progress Notes (Signed)
[facility] Unk Pinto, M.D.     [name]. [name], P.A.-C Liane Comber, [place] [address] [place], [place] [address] Telephone [phone] Telefax [phone] Comprehensive Evaluation &  Examination     This very nice 76 y.o. [facility]  presents for a comprehensive evaluation and management of multiple medical co-morbidities.  Patient has been followed for HTN, T2_NIDDM, Hyperlipidemia and Vitamin D Deficiency. Patient has hx/o GERD controlled w/ prudent diet & meds and likewise she has Gout also controlled on meds.       HTN predates since 46. Patient's BP has been controlled at home and patient denies any cardiac symptoms as chest pain, palpitations, shortness of breath, dizziness or ankle swelling. Today's BP is at goal - 120/64.      Patient's hyperlipidemia is controlled with diet and medications. Patient denies myalgias or other medication SE's. Last lipids were at goal albeit elevated Trig's: Lab Results  Component Value Date   CHOL 175 10/01/2017   HDL 51 10/01/2017   LDLCALC 65 07/01/2017   TRIG 206 (H) 10/01/2017   CHOLHDL 3.4 10/01/2017      Patient was dx'd prediabetic in 2014  (A1c 6.0%) and then T2_NIDDM in Mar 2017 (A1c 6.6%) and is attempting dietary control. She denies reactive hypoglycemic symptoms, visual blurring, diabetic polys  or paresthesias.  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  Last A1c was near goal: Lab Results  Component Value Date   HGBA1C 5.8 (H) 10/01/2017      Finally, patient has history of Vitamin D Deficiency  ("34" / 2008) and last Vitamin D was still low (goal 70-100): Lab Results  Component Value Date   VD25OH 44 07/01/2017   Current Outpatient Medications on File Prior to Visit  Medication Sig  . allopurinol  (ZYLOPRIM) 300 MG tablet TAKE 1 TABLET BY MOUTH EVERY DAY  . atenolol (TENORMIN) 50 MG tablet TAKE 1 TABLET DAILY FOR BLOOD PRESSURE  . atorvastatin (LIPITOR) 20 MG tablet TAKE 1 TABLET DAILY AS DIRECTED FOR CHOLESTEROL  . BABY ASPIRIN PO Take 81 mg by mouth daily.  . calcitRIOL (ROCALTROL) 0.25 MCG capsule Take 0.25 mcg by mouth daily.  . furosemide (LASIX) 40 MG tablet TAKE 1 TABLET DAILY FOR BLOOD PRESSURE AND FLUID  . Multiple Vitamins-Minerals (ICAPS AREDS 2 PO) Take 1 capsule by mouth daily.  . Multiple Vitamins-Minerals (MULTIVITAMIN PO) Take by mouth daily.  . Omega-3 Fatty Acids (FISH OIL PO) Take by mouth daily.  . ranitidine (ZANTAC) 300 MG tablet TAKE 1 TABLET TWICE A DAY FOR ACID INDIGESTION AND REFLUX   No current facility-administered medications on file prior to visit.    Allergies  Allergen Reactions  . Ace Inhibitors     Pt unsure of reaction  . Feldene [Piroxicam] Rash   Past Medical History:  Diagnosis Date  . Cataract   . Chronic kidney disease   . DDD (degenerative disc disease), lumbar   . DJD (degenerative joint disease)   . GERD (gastroesophageal reflux disease)   . Hyperlipidemia   . Hypertension   . IBS (irritable bowel syndrome)   . Prediabetes    Health Maintenance  Topic Date Due  . OPHTHALMOLOGY EXAM  09/25/2017  . URINE MICROALBUMIN  12/24/2017  . HEMOGLOBIN A1C  04/01/2018  .  FOOT EXAM  01/06/2019  . COLONOSCOPY  08/30/2024  . TETANUS/TDAP  09/19/2025  . INFLUENZA VACCINE  Completed  . DEXA SCAN  Completed  . PNA vac Low Risk Adult  Completed   Immunization History  Administered Date(s) Administered  . DT 09/29/2014, 09/20/2015  . Influenza Split 09/28/2013  . Influenza, High Dose Seasonal PF 10/13/2014, 09/03/2016, 10/01/2017  . Influenza-Unspecified 08/04/2015  . Pneumococcal Conjugate-13 10/13/2014  . Pneumococcal Polysaccharide-23 09/23/2012  . Td 09/02/2004  . Zoster 10/13/2013   - Last Colon - 08/30/2014 - Dr Carlean Purl recc 10  yr f/u in 2025. - MGM scheduled 01/30/2018 - Has eye exam - Dr Macarthur Critchley scheduled for next month  Past Surgical History:  Procedure Laterality Date  . ABDOMINAL HYSTERECTOMY    . CATARACT EXTRACTION    . COLONOSCOPY  x 2   w/Dr Carlean Purl   Family History  Problem Relation Age of Onset  . Cancer Mother        liver  . Liver cancer Mother   . Heart disease Father   . Heart attack Father   . Hypertension Sister   . Cancer Sister        kidney  . Colon cancer Neg Hx   . Breast cancer Neg Hx    Social History   Tobacco Use  . Smoking status: Former Smoker    Last attempt to quit: 06/16/2004    Years since quitting: 13.5  . Smokeless tobacco: Never Used  Substance Use Topics  . Alcohol use: No  . Drug use: No    ROS Constitutional: Denies fever, chills, weight loss/gain, headaches, insomnia,  night sweats, and change in appetite. Does c/o fatigue. Eyes: Denies redness, blurred vision, diplopia, discharge, itchy, watery eyes.  ENT: Denies discharge, congestion, post nasal drip, epistaxis, sore throat, earache, hearing loss, dental pain, Tinnitus, Vertigo, Sinus pain, snoring.  Cardio: Denies chest pain, palpitations, irregular heartbeat, syncope, dyspnea, diaphoresis, orthopnea, PND, claudication, edema Respiratory: denies cough, dyspnea, DOE, pleurisy, hoarseness, laryngitis, wheezing.  Gastrointestinal: Denies dysphagia, heartburn, reflux, water brash, pain, cramps, nausea, vomiting, bloating, diarrhea, constipation, hematemesis, melena, hematochezia, jaundice, hemorrhoids Genitourinary: Denies dysuria, frequency, urgency, nocturia, hesitancy, discharge, hematuria, flank pain Breast: Breast lumps, nipple discharge, bleeding.  Musculoskeletal: Denies arthralgia, myalgia, stiffness, Jt. Swelling, pain, limp, and strain/sprain. Denies falls. Skin: Denies puritis, rash, hives, warts, acne, eczema, changing in skin lesion Neuro: No weakness, tremor, incoordination, spasms,  paresthesia, pain Psychiatric: Denies confusion, memory loss, sensory loss. Denies Depression. Endocrine: Denies change in weight, skin, hair change, nocturia, and paresthesia, diabetic polys, visual blurring, hyper / hypo glycemic episodes.  Heme/Lymph: No excessive bleeding, bruising, enlarged lymph nodes.  Physical Exam  BP 120/64   Pulse 60   Temp (!) 97.3 F (36.3 C)   Resp 16   Ht 5\' 3"  (1.6 m)   Wt 177 lb (80.3 kg)   BMI 31.35 kg/m   General Appearance: Well nourished, well groomed and in no apparent distress.  Eyes: PERRLA, EOMs, conjunctiva no swelling or erythema, normal fundi and vessels. Sinuses: No frontal/maxillary tenderness ENT/Mouth: EACs patent / TMs  nl. Nares clear without erythema, swelling, mucoid exudates. Oral hygiene is good. No erythema, swelling, or exudate. Tongue normal, non-obstructing. Tonsils not swollen or erythematous. Hearing normal.  Neck: Supple, thyroid normal. No bruits, nodes or JVD. Respiratory: Respiratory effort normal.  BS equal and clear bilateral without rales, rhonci, wheezing or stridor. Cardio: Heart sounds are normal with regular rate and rhythm and no murmurs, rubs or  gallops. Peripheral pulses are normal and equal bilaterally without edema. No aortic or femoral bruits. Chest: symmetric with normal excursions and percussion. Breasts: Symmetric, without lumps, nipple discharge, retractions, or fibrocystic changes.  Abdomen: Flat, soft with bowel sounds active. Nontender, no guarding, rebound, hernias, masses, or organomegaly.  Lymphatics: Non tender without lymphadenopathy.  Musculoskeletal: Full ROM all peripheral extremities, joint stability, 5/5 strength, and normal gait. Skin: Warm and dry without rashes, lesions, cyanosis, clubbing or  ecchymosis.  Neuro: Cranial nerves intact, reflexes equal bilaterally. Normal muscle tone, no cerebellar symptoms. Sensation intact to touch, vibratory and Monofilament to the toes  bilaterally. Pysch: Alert and oriented X 3, normal affect, Insight and Judgment appropriate.   Assessment and Plan  1. Essential hypertension  - EKG 12-Lead - Urinalysis, Routine w reflex microscopic - Microalbumin / creatinine urine ratio - CBC with Differential/Platelet - BASIC METABOLIC PANEL WITH GFR - Magnesium - TSH  2. Hyperlipidemia, mixed  - EKG 12-Lead - Hepatic function panel - Lipid panel - TSH  3. Type 2 diabetes mellitus with stage 4 chronic kidney disease, without long-term current use of insulin (HCC)  - EKG 12-Lead - Urinalysis, Routine w reflex microscopic - Microalbumin / creatinine urine ratio - HM DIABETES FOOT EXAM - LOW EXTREMITY NEUR EXAM DOCUM - BASIC METABOLIC PANEL WITH GFR - Hemoglobin A1c - Insulin, random  4. Vitamin D deficiency  - VITAMIN D 25 Hydroxy   5. Idiopathic gout  - Uric acid  6. Gastroesophageal reflux disease  - CBC with Differential/Platelet  7. Screening for ischemic heart disease  - EKG 12-Lead  8. Screening for colorectal cancer  - POC Hemoccult Bld/Stl   9. Medication management  - Urinalysis, Routine w reflex microscopic - Microalbumin / creatinine urine ratio - Uric acid - CBC with Differential/Platelet        Patient was counseled in prudent diet to achieve/maintain BMI less than 25 for weight control, BP monitoring, regular exercise and medications. Discussed med's effects and SE's. Screening labs and tests as requested with regular follow-up as recommended. Over 40 minutes of exam, counseling, chart review and high complex critical decision making was performed.

## 2018-01-07 ENCOUNTER — Other Ambulatory Visit: Payer: Self-pay | Admitting: Internal Medicine

## 2018-01-07 LAB — LIPID PANEL
Cholesterol: 159 mg/dL (ref <200)
HDL: 45 mg/dL — AB (ref >50)
LDL Cholesterol (Calc): 81 mg/dL (calc)
NON-HDL CHOLESTEROL (CALC): 114 mg/dL (ref <130)
Total CHOL/HDL Ratio: 3.5 (calc) (ref <5.0)
Triglycerides: 250 mg/dL — ABNORMAL HIGH (ref <150)

## 2018-01-07 LAB — URINALYSIS, ROUTINE W REFLEX MICROSCOPIC
Bilirubin Urine: NEGATIVE
GLUCOSE, UA: NEGATIVE
HGB URINE DIPSTICK: NEGATIVE
KETONES UR: NEGATIVE
LEUKOCYTES UA: NEGATIVE
NITRITE: NEGATIVE
PROTEIN: NEGATIVE
Specific Gravity, Urine: 1.007 (ref 1.001–1.03)
pH: 5.5 (ref 5.0–8.0)

## 2018-01-07 LAB — BASIC METABOLIC PANEL WITH GFR
BUN / CREAT RATIO: 23 (calc) — AB (ref 6–22)
BUN: 46 mg/dL — AB (ref 7–25)
CO2: 30 mmol/L (ref 20–32)
CREATININE: 1.99 mg/dL — AB (ref 0.60–0.93)
Calcium: 10.3 mg/dL (ref 8.6–10.4)
Chloride: 97 mmol/L — ABNORMAL LOW (ref 98–110)
GFR, EST AFRICAN AMERICAN: 28 mL/min/{1.73_m2} — AB (ref >=60)
GFR, EST NON AFRICAN AMERICAN: 24 mL/min/{1.73_m2} — AB (ref >=60)
GLUCOSE: 91 mg/dL (ref 65–99)
Potassium: 3.5 mmol/L (ref 3.5–5.3)
SODIUM: 138 mmol/L (ref 135–146)

## 2018-01-07 LAB — CBC WITH DIFFERENTIAL/PLATELET
BASOS ABS: 63 {cells}/uL (ref 0–200)
Basophils Relative: 0.7 %
EOS ABS: 144 {cells}/uL (ref 15–500)
Eosinophils Relative: 1.6 %
HEMATOCRIT: 35.2 % (ref 35.0–45.0)
HEMOGLOBIN: 12.3 g/dL (ref 11.7–15.5)
Lymphs Abs: 2394 cells/uL (ref 850–3900)
MCH: 30.4 pg (ref 27.0–33.0)
MCHC: 34.9 g/dL (ref 32.0–36.0)
MCV: 87.1 fL (ref 80.0–100.0)
MONOS PCT: 8.3 %
MPV: 10.5 fL (ref 7.5–12.5)
NEUTROS ABS: 5652 {cells}/uL (ref 1500–7800)
Neutrophils Relative %: 62.8 %
Platelets: 274 10*3/uL (ref 140–400)
RBC: 4.04 10*6/uL (ref 3.80–5.10)
RDW: 13.9 % (ref 11.0–15.0)
Total Lymphocyte: 26.6 %
WBC: 9 10*3/uL (ref 3.8–10.8)
WBCMIX: 747 {cells}/uL (ref 200–950)

## 2018-01-07 LAB — HEPATIC FUNCTION PANEL
AG RATIO: 2 (calc) (ref 1.0–2.5)
ALBUMIN MSPROF: 4.3 g/dL (ref 3.6–5.1)
ALT: 15 U/L (ref 6–29)
AST: 19 U/L (ref 10–35)
Alkaline phosphatase (APISO): 99 U/L (ref 33–130)
BILIRUBIN TOTAL: 0.7 mg/dL (ref 0.2–1.2)
Bilirubin, Direct: 0.1 mg/dL (ref 0.0–0.2)
Globulin: 2.2 g/dL (calc) (ref 1.9–3.7)
Indirect Bilirubin: 0.6 mg/dL (calc) (ref 0.2–1.2)
TOTAL PROTEIN: 6.5 g/dL (ref 6.1–8.1)

## 2018-01-07 LAB — MAGNESIUM: Magnesium: 1.9 mg/dL (ref 1.5–2.5)

## 2018-01-07 LAB — MICROALBUMIN / CREATININE URINE RATIO
Creatinine, Urine: 39 mg/dL (ref 20–275)
MICROALB/CREAT RATIO: 5 ug/mg{creat} (ref <30)
Microalb, Ur: 0.2 mg/dL

## 2018-01-07 LAB — TSH: TSH: 2.14 mIU/L (ref 0.40–4.50)

## 2018-01-07 LAB — HEMOGLOBIN A1C
EAG (MMOL/L): 6.8 (calc)
Hgb A1c MFr Bld: 5.9 % of total Hgb — ABNORMAL HIGH (ref <5.7)
MEAN PLASMA GLUCOSE: 123 (calc)

## 2018-01-07 LAB — URIC ACID: Uric Acid, Serum: 4.3 mg/dL (ref 2.5–7.0)

## 2018-01-07 LAB — INSULIN, RANDOM: Insulin: 23.4 u[IU]/mL — ABNORMAL HIGH (ref 2.0–19.6)

## 2018-01-07 LAB — VITAMIN D 25 HYDROXY (VIT D DEFICIENCY, FRACTURES): VIT D 25 HYDROXY: 40 ng/mL (ref 30–100)

## 2018-01-30 ENCOUNTER — Ambulatory Visit
Admission: RE | Admit: 2018-01-30 | Discharge: 2018-01-30 | Disposition: A | Discharge status: Home / Self Care | Payer: Medicare Other | Source: Ambulatory Visit | Attending: Internal Medicine | Admitting: Internal Medicine

## 2018-01-30 DIAGNOSIS — Z1231 Encounter for screening mammogram for malignant neoplasm of breast: Secondary | ICD-10-CM | POA: Diagnosis not present

## 2018-01-30 DIAGNOSIS — Z139 Encounter for screening, unspecified: Secondary | ICD-10-CM

## 2018-02-10 ENCOUNTER — Other Ambulatory Visit: Payer: Self-pay

## 2018-02-10 DIAGNOSIS — Z1212 Encounter for screening for malignant neoplasm of rectum: Secondary | ICD-10-CM | POA: Diagnosis not present

## 2018-02-10 DIAGNOSIS — Z1211 Encounter for screening for malignant neoplasm of colon: Secondary | ICD-10-CM

## 2018-02-10 LAB — POC HEMOCCULT BLD/STL (HOME/3-CARD/SCREEN)
Card #3 Fecal Occult Blood, POC: NEGATIVE
FECAL OCCULT BLD: NEGATIVE
FECAL OCCULT BLD: NEGATIVE

## 2018-02-18 DIAGNOSIS — H01111 Allergic dermatitis of right upper eyelid: Secondary | ICD-10-CM | POA: Diagnosis not present

## 2018-02-19 ENCOUNTER — Encounter: Payer: Self-pay | Admitting: Physician Assistant

## 2018-02-19 ENCOUNTER — Ambulatory Visit (INDEPENDENT_AMBULATORY_CARE_PROVIDER_SITE_OTHER): Payer: Medicare Other | Admitting: Physician Assistant

## 2018-02-19 VITALS — BP 132/84 | HR 67 | Temp 97.7°F | Resp 14 | Ht 63.0 in | Wt 181.2 lb

## 2018-02-19 DIAGNOSIS — L219 Seborrheic dermatitis, unspecified: Secondary | ICD-10-CM

## 2018-02-19 MED ORDER — KETOCONAZOLE 2 % EX CREA: 1.0000 "application " | TOPICAL_CREAM | Freq: Two times a day (BID) | CUTANEOUS | 1 refills | Status: DC

## 2018-02-19 NOTE — Patient Instructions (Addendum)
ketoconazole sent I- use twice a day until symptoms subside   can use OTC hydrocortisone cream for 2-4 days at night and baby shampoo  call if it does not get better/gets worse and we will send in oral medication  Seborrheic Dermatitis, Adult Seborrheic dermatitis is a skin disease that causes red, scaly patches. It usually occurs on the scalp, and it is often called dandruff. The patches may appear on other parts of the body. Skin patches tend to appear where there are many oil glands in the skin. Areas of the body that are commonly affected include:  Scalp.  Skin folds of the body.  Ears.  Eyebrows.  Neck.  Face.  Armpits.  The bearded area of men's faces.  The condition may come and go for no known reason, and it is often long-lasting (chronic). What are the causes? The cause of this condition is not known. What increases the risk? This condition is more likely to develop in people who:  Have certain conditions, such as: ? HIV (human immunodeficiency virus). ? AIDS (acquired immunodeficiency syndrome). ? Parkinson disease. ? Mood disorders, such as depression.  Are 53-37 years old.  What are the signs or symptoms? Symptoms of this condition include:  Thick scales on the scalp.  Redness on the face or in the armpits.  Skin that is flaky. The flakes may be white or yellow.  Skin that seems oily or dry but is not helped with moisturizers.  Itching or burning in the affected areas.  How is this diagnosed? This condition is diagnosed with a medical history and physical exam. A sample of your skin may be tested (skin biopsy). You may need to see a skin specialist (dermatologist). How is this treated? There is no cure for this condition, but treatment can help to manage the symptoms. You may get treatment to remove scales, lower the risk of skin infection, and reduce swelling or itching. Treatment may include:  Creams that reduce swelling and irritation  (steroids).  Creams that reduce skin yeast.  Medicated shampoo, soaps, moisturizing creams, or ointments.  Medicated moisturizing creams or ointments.  Follow these instructions at home:  Apply over-the-counter and prescription medicines only as told by your health care provider.  Use any medicated shampoo, soaps, skin creams, or ointments only as told by your health care provider.  Keep all follow-up visits as told by your health care provider. This is important. Contact a health care provider if:  Your symptoms do not improve with treatment.  Your symptoms get worse.  You have new symptoms. This information is not intended to replace advice given to you by your health care provider. Make sure you discuss any questions you have with your health care provider. Document Released: 11/19/2005 Document Revised: 06/08/2016 Document Reviewed: 03/08/2016 Elsevier Interactive Patient Education  [employer].

## 2018-02-19 NOTE — Progress Notes (Signed)
Subjective:    Patient ID: Molli Hazard, female    DOB: 1941-10-20, 77 y.o.   MRN: [id number]  HPI 77 y.o. WF presents with rash around her eyes and mouth.  Has been coming and going x 3 weeks, intermittent, baby shampoo was helping but now it is worse, itching and burning.   Lab Results  Component Value Date   CREATININE 1.99 (H) 01/06/2018   BUN 46 (H) 01/06/2018   NA 138 01/06/2018   K 3.5 01/06/2018   CL 97 (L) 01/06/2018   CO2 30 01/06/2018     Lab Results  Component Value Date   GFRNONAA 24 (L) 01/06/2018     Blood pressure 132/84, pulse 67, temperature 97.7 F (36.5 C), resp. rate 14, height 5\' 3"  (1.6 m), weight 181 lb 3.2 oz (82.2 kg), SpO2 97 %.  Medications Current Outpatient Medications on File Prior to Visit  Medication Sig  . allopurinol (ZYLOPRIM) 300 MG tablet TAKE 1 TABLET BY MOUTH EVERY DAY  . atenolol (TENORMIN) 50 MG tablet TAKE 1 TABLET DAILY FOR BLOOD PRESSURE  . atorvastatin (LIPITOR) 20 MG tablet TAKE 1 TABLET DAILY AS DIRECTED FOR CHOLESTEROL  . BABY ASPIRIN PO Take 81 mg by mouth daily.  . calcitRIOL (ROCALTROL) 0.25 MCG capsule Take 0.25 mcg by mouth daily.  . furosemide (LASIX) 40 MG tablet TAKE 1 TABLET DAILY FOR BLOOD PRESSURE AND FLUID  . Multiple Vitamins-Minerals (ICAPS AREDS 2 PO) Take 1 capsule by mouth daily.  . Multiple Vitamins-Minerals (MULTIVITAMIN PO) Take by mouth daily.  . Omega-3 Fatty Acids (FISH OIL PO) Take by mouth daily.  . ranitidine (ZANTAC) 300 MG tablet TAKE 1 TABLET TWICE A DAY FOR ACID INDIGESTION AND REFLUX   No current facility-administered medications on file prior to visit.     Problem list She has Hyperlipidemia; Hypertension; GERD (gastroesophageal reflux disease); DJD (degenerative joint disease); DDD (degenerative disc disease), lumbar; IBS (irritable bowel syndrome); CKD (chronic kidney disease) stage 4, GFR 15-29 ml/min ([place]); Vitamin D deficiency; Medication management; Morbid obesity ([place]); Gout; BMI  31.0-31.9,adult; Medicare annual wellness visit, subsequent; and T2_ NIDDM w/ CKD 4 (GFR 28 ml/min)   (HCC) on their problem list.   Review of Systems  Constitutional: Negative.  Negative for chills and fever.  HENT: Negative.   Respiratory: Negative.   Cardiovascular: Negative.   Gastrointestinal: Negative.  Negative for diarrhea.  Genitourinary: Negative.   Musculoskeletal: Negative.  Negative for arthralgias.  Skin: Positive for rash. Negative for color change and wound.  Neurological: Negative.  Negative for dizziness.       Objective:   Physical Exam  Constitutional: She appears well-developed and well-nourished. No distress.  HENT:  Head: Normocephalic and atraumatic.  Right Ear: External ear normal.  Left Ear: External ear normal.  Nose: Nose normal.  Mouth/Throat: Oropharynx is clear and moist. No oropharyngeal exudate.  Eyes: Conjunctivae and EOM are normal. Pupils are equal, round, and reactive to light. Right eye exhibits no discharge. Left eye exhibits no discharge.  Neck: Normal range of motion. Neck supple.  Lymphadenopathy:    She has no cervical adenopathy.  Neurological: No cranial nerve deficit.  Skin: Skin is warm and dry. Rash noted.  Scaly erythematous rash along bilateral eyes/eyelids and along nasal folds. No warmth, no discharge/weeping.       Assessment & Plan:  Seb Dermatitis- info given, ketoconazole sent in,  can use OTC hydrocortisone cream and baby shampoo, call if it does not get better/gets worse and we  will send in oral medication but will try to avoid due to GFR  Future Appointments  Date Time Provider [place]  04/10/2018  3:00 PM Liane Comber, NP [facility] None  07/31/2018  2:30 PM Unk Pinto, MD [facility] None  02/11/2019  3:00 PM Unk Pinto, MD [facility] None

## 2018-03-03 ENCOUNTER — Telehealth: Payer: Self-pay | Admitting: Physician Assistant

## 2018-03-03 MED ORDER — MOMETASONE FUROATE 0.1 % EX SOLN: Freq: Every day | CUTANEOUS | 2 refills | Status: DC

## 2018-03-03 MED ORDER — KETOCONAZOLE 2 % EX CREA: 1.0000 "application " | TOPICAL_CREAM | Freq: Two times a day (BID) | CUTANEOUS | 1 refills | Status: DC

## 2018-03-03 NOTE — Telephone Encounter (Signed)
With your kidney function I would really like to try to avoid an oral medication a little bit longer. Continue the ketazonazole cream and I have sent in a stronger antiinflammatory cream for you to use, use them both twice a day for 7 days, rash should be improving. If the rash gets worse at all please make another office visit.   Sent to [facility]

## 2018-03-03 NOTE — Telephone Encounter (Signed)
Pt has been made aware & voiced understanding. Pt reports she will pick up new rx at pharmacy. April 1st 2019 at 11:36am By DD

## 2018-04-07 DIAGNOSIS — M10379 Gout due to renal impairment, unspecified ankle and foot: Secondary | ICD-10-CM | POA: Diagnosis not present

## 2018-04-07 DIAGNOSIS — K219 Gastro-esophageal reflux disease without esophagitis: Secondary | ICD-10-CM | POA: Diagnosis not present

## 2018-04-07 DIAGNOSIS — E782 Mixed hyperlipidemia: Secondary | ICD-10-CM | POA: Diagnosis not present

## 2018-04-07 DIAGNOSIS — Z0000 Encounter for general adult medical examination without abnormal findings: Secondary | ICD-10-CM | POA: Diagnosis not present

## 2018-04-07 DIAGNOSIS — D631 Anemia in chronic kidney disease: Secondary | ICD-10-CM | POA: Diagnosis not present

## 2018-04-07 DIAGNOSIS — E669 Obesity, unspecified: Secondary | ICD-10-CM | POA: Diagnosis not present

## 2018-04-07 DIAGNOSIS — M1990 Unspecified osteoarthritis, unspecified site: Secondary | ICD-10-CM | POA: Diagnosis not present

## 2018-04-07 DIAGNOSIS — N183 Chronic kidney disease, stage 3 (moderate): Secondary | ICD-10-CM | POA: Diagnosis not present

## 2018-04-07 DIAGNOSIS — I129 Hypertensive chronic kidney disease with stage 1 through stage 4 chronic kidney disease, or unspecified chronic kidney disease: Secondary | ICD-10-CM | POA: Diagnosis not present

## 2018-04-07 DIAGNOSIS — N2581 Secondary hyperparathyroidism of renal origin: Secondary | ICD-10-CM | POA: Diagnosis not present

## 2018-04-09 NOTE — Progress Notes (Signed)
MEDICARE ANNUAL WELLNESS VISIT AND FOLLOW UP  Assessment:   Diagnoses and all orders for this visit:  Encounter for Medicare annual wellness exam  Essential hypertension Continue medication Monitor blood pressure at home; call if consistently over 130/80 Continue DASH diet.   Reminder to go to the ER if any CP, SOB, nausea, dizziness, severe HA, changes vision/speech, left arm numbness and tingling and jaw pain.  Irritable bowel syndrome, unspecified type Symptoms stable with lifestyle modification  Gastroesophageal reflux disease, esophagitis presence not specified Well managed on current medications Discussed diet, avoiding triggers and other lifestyle changes  Primary osteoarthritis involving multiple joints Mild, improved after cortisone shots, uses tylenol PRN Continue follow up with ortho as needed  CKD (chronic kidney disease) stage 4, GFR 15-29 ml/min (HCC) Increase fluids, avoid NSAIDS, monitor sugars, will monitor Continue follow up with nephrology  Vitamin D deficiency Stopped by nephrology  Medication management CBC, CMP/GFR  Mixed hyperlipidemia Continue medications Continue low cholesterol diet and exercise.  Check lipid panel.   Idiopathic gout, unspecified chronicity, unspecified site Continue allopurinol Diet discussed Check uric acid as needed  Obesity (BMI 30.0-34.9) Long discussion about weight loss, diet, and exercise Recommended diet heavy in fruits and veggies and low in animal meats, cheeses, and dairy products, appropriate calorie intake Discussed appropriate weight for height Follow up at next visit  Prediabetes Discussed disease and risks Discussed diet/exercise, weight management  A1C   Over 40 minutes of exam, counseling, chart review and critical decision making was performed Future Appointments  Date Time Provider [place]  07/31/2018  2:30 PM Unk Pinto, MD [facility] None  02/11/2019  3:00 PM Unk Pinto, MD [facility] None     Plan:   During the course of the visit the patient was educated and counseled about appropriate screening and preventive services including:    Pneumococcal vaccine   Prevnar 13  Influenza vaccine  Td vaccine  Screening electrocardiogram  Bone densitometry screening  Colorectal cancer screening  Diabetes screening  Glaucoma screening  Nutrition counseling   Advanced directives: requested   Subjective:  Kelly Cantu is a 77 y.o. female who presents for Medicare Annual Wellness Visit and 3 month follow up. Patient has hx/o GERD controlled by lifestyle and meds and likewise she has Gout also controlled on meds. She is followed by Dr. 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 She just had CMP/GFR drawn by nephrology, is followed q3 months, requests we defer this today.   BMI is Body mass index is 31.35 kg/m., she has been working on diet (lean cuisean and eggs for breakfast, multi grain toast, etc)  and exercise.  Wt Readings from Last 3 Encounters:  04/10/18 177 lb (80.3 kg)  02/19/18 181 lb 3.2 oz (82.2 kg)  01/06/18 177 lb (80.3 kg)    Her blood pressure has been controlled at home, today their BP is BP: 134/72 She does not workout. She denies chest pain, shortness of breath, dizziness.   She is on cholesterol medication (atorvastatin 20 mg daily)  and denies myalgias. Her LDL cholesterol is at goal; triglycerides remain elevated. The cholesterol last visit was:   Lab Results  Component Value Date   CHOL 159 01/06/2018   HDL 45 (L) 01/06/2018   LDLCALC 81 01/06/2018   TRIG 250 (H) 01/06/2018   CHOLHDL 3.5 01/06/2018    She has been working on diet for prediabetes, and denies foot ulcerations, increased appetite, nausea, paresthesia of the feet, polydipsia, polyuria, visual disturbances, vomiting and  weight loss. Last A1C in the office was:  Lab Results  Component Value Date   HGBA1C 5.9 (H) 01/06/2018   Last GFR: Lab Results   Component Value Date   GFRNONAA 24 (L) 01/06/2018   Patient is on Vitamin D supplement.   Lab Results  Component Value Date   VD25OH 40 01/06/2018      Medication Review: Current Outpatient Medications on File Prior to Visit  Medication Sig Dispense Refill  . allopurinol (ZYLOPRIM) 300 MG tablet TAKE 1 TABLET BY MOUTH EVERY DAY 90 tablet 99  . atenolol (TENORMIN) 50 MG tablet TAKE 1 TABLET DAILY FOR BLOOD PRESSURE 90 tablet 3  . atorvastatin (LIPITOR) 20 MG tablet TAKE 1 TABLET DAILY AS DIRECTED FOR CHOLESTEROL 90 tablet 1  . BABY ASPIRIN PO Take 81 mg by mouth daily.    . calcitRIOL (ROCALTROL) 0.25 MCG capsule Take 0.25 mcg by mouth daily.    . furosemide (LASIX) 40 MG tablet TAKE 1 TABLET DAILY FOR BLOOD PRESSURE AND FLUID 90 tablet 3  . ketoconazole (NIZORAL) 2 % cream Apply 1 application topically 2 (two) times daily. 30 g 1  . mometasone (ELOCON) 0.1 % lotion Apply topically daily. 60 mL 2  . Multiple Vitamins-Minerals (ICAPS AREDS 2 PO) Take 1 capsule by mouth daily.    . Multiple Vitamins-Minerals (MULTIVITAMIN PO) Take by mouth daily.    . Omega-3 Fatty Acids (FISH OIL PO) Take by mouth daily.    . ranitidine (ZANTAC) 300 MG tablet TAKE 1 TABLET TWICE A DAY FOR ACID INDIGESTION AND REFLUX 180 tablet 1   No current facility-administered medications on file prior to visit.     Allergies  Allergen Reactions  . Ace Inhibitors     Pt unsure of reaction  . Feldene [Piroxicam] Rash    Current Problems (verified) Patient Active Problem List   Diagnosis Date Noted  . Encounter for Medicare annual wellness exam 11/13/2015  . Obesity (BMI 30.0-34.9) 11/11/2015  . Gout 04/11/2015  . Vitamin D deficiency 09/29/2014  . Medication management 09/29/2014  . CKD (chronic kidney disease) stage 4, GFR 15-29 ml/min (HCC) 07/22/2014  . Hyperlipidemia   . Hypertension   . Prediabetes   . GERD (gastroesophageal reflux disease)   . DJD (degenerative joint disease)   . DDD  (degenerative disc disease), lumbar   . IBS (irritable bowel syndrome)     Screening Tests Immunization History  Administered Date(s) Administered  . DT 09/29/2014, 09/20/2015  . Influenza Split 09/28/2013  . Influenza, High Dose Seasonal PF 10/13/2014, 09/03/2016, 10/01/2017  . Influenza-Unspecified 08/04/2015  . Pneumococcal Conjugate-13 10/13/2014  . Pneumococcal Polysaccharide-23 09/23/2012  . Td 09/02/2004  . Zoster 10/13/2013   Preventative care: Last colonoscopy: 2015 Last mammogram: 01/2018 Last pap smear/pelvic exam: remote  DEXA:2011 CXR 2015 US renal 2012  Prior vaccinations: TD or Tdap: 2016 Influenza: 2018  Pneumococcal: 2013 Prevnar13: 2015 Shingles/Zostavax: 2014  Names of Other Physician/Practitioners you currently use: 1. [facility] here for primary care 2. Dr. Nicki Reaper, eye doctor, last visit 2019 3. Randol Kern, dentist, last visit 2019 q6 months  Patient Care Team: Unk Pinto, MD as PCP - General (Internal Medicine) Deterding, Jeneen Rinks, MD as Consulting Physician (Nephrology) Gatha Mayer, MD as Consulting Physician (Gastroenterology) Macarthur Critchley, OD as Referring Physician (Optometry)  SURGICAL HISTORY She  has a past surgical history that includes Abdominal hysterectomy; Cataract extraction; and Colonoscopy (x 2). FAMILY HISTORY Her family history includes Cancer in her mother and  sister; Heart attack in her father; Heart disease in her father; Hypertension in her sister; Liver cancer in her mother. SOCIAL HISTORY She  reports that she quit smoking about 13 years ago. She has never used smokeless tobacco. She reports that she does not drink alcohol or use drugs.   MEDICARE WELLNESS OBJECTIVES: Physical activity:   Cardiac risk factors:   Depression/mood screen:   Depression screen [facility] 2/9 04/10/2018  Decreased Interest 0  Down, Depressed, Hopeless 0  PHQ - 2 Score 0    ADLs:  In your present state of  health, do you have any difficulty performing the following activities: 01/06/2018 07/01/2017  Hearing? N N  Vision? N N  Difficulty concentrating or making decisions? N N  Walking or climbing stairs? N N  Dressing or bathing? N N  Doing errands, shopping? N N  Some recent data might be hidden     Cognitive Testing  Alert? Yes  Normal Appearance?Yes  Oriented to person? Yes  Place? Yes   Time? Yes  Recall of three objects?  Yes  Can perform simple calculations? Yes  Displays appropriate judgment?Yes  Can read the correct time from a watch face?Yes  EOL planning: Does Patient Have a Medical Advance Directive?: Yes Type of Advance Directive: Living will, Healthcare Power of Attorney Does patient want to make changes to medical advance directive?: No - Patient declined Copy of [place] in Chart?: No - copy requested  Review of Systems  Constitutional: Negative for malaise/fatigue and weight loss.  HENT: Negative for hearing loss and tinnitus.   Eyes: Negative for blurred vision and double vision.  Respiratory: Negative for cough, sputum production, shortness of breath and wheezing.   Cardiovascular: Negative for chest pain, palpitations, orthopnea, claudication, leg swelling and PND.  Gastrointestinal: Negative for abdominal pain, blood in stool, constipation, diarrhea, heartburn, melena, nausea and vomiting.  Genitourinary: Negative.   Musculoskeletal: Negative for falls, joint pain and myalgias.  Skin: Negative for rash.  Neurological: Negative for dizziness, tingling, sensory change, weakness and headaches.  Endo/Heme/Allergies: Negative for polydipsia.  Psychiatric/Behavioral: Negative.  Negative for depression, memory loss, substance abuse and suicidal ideas. The patient is not nervous/anxious and does not have insomnia.   All other systems reviewed and are negative.    Objective:     Today's Vitals   04/10/18 1530  BP: 134/72  Pulse: 63  Temp: (!)  97.3 F (36.3 C)  SpO2: 95%  Weight: 177 lb (80.3 kg)  Height: 5\' 3"  (1.6 m)   Body mass index is 31.35 kg/m.  General appearance: alert, no distress, WD/WN, female HEENT: normocephalic, sclerae anicteric, TMs pearly, nares patent, no discharge or erythema, pharynx normal Oral cavity: MMM, no lesions Neck: supple, no lymphadenopathy, no thyromegaly, no masses Heart: RRR, normal S1, S2, no murmurs Lungs: CTA bilaterally, no wheezes, rhonchi, or rales Abdomen: +bs, soft, non tender, non distended, no masses, no hepatomegaly, no splenomegaly Musculoskeletal: nontender, no swelling, no obvious deformity Extremities: no edema, no cyanosis, no clubbing Pulses: 2+ symmetric, upper and lower extremities, normal cap refill Neurological: alert, oriented x 3, CN2-12 intact, strength normal upper extremities and lower extremities, sensation normal throughout, DTRs 2+ throughout, no cerebellar signs, gait normal Psychiatric: normal affect, behavior normal, pleasant   Medicare Attestation I have personally reviewed: The patient's medical and social history Their use of alcohol, tobacco or illicit drugs Their current medications and supplements The patient's functional ability including ADLs,fall risks, home safety risks, cognitive, and hearing and  visual impairment Diet and physical activities Evidence for depression or mood disorders  The patient's weight, height, BMI, and visual acuity have been recorded in the chart.  I have made referrals, counseling, and provided education to the patient based on review of the above and I have provided the patient with a written personalized care plan for preventive services.     Izora Ribas, NP   04/10/2018

## 2018-04-10 ENCOUNTER — Encounter: Payer: Self-pay | Admitting: Adult Health

## 2018-04-10 ENCOUNTER — Ambulatory Visit: Payer: Self-pay | Admitting: Physician Assistant

## 2018-04-10 ENCOUNTER — Ambulatory Visit (INDEPENDENT_AMBULATORY_CARE_PROVIDER_SITE_OTHER): Payer: Medicare Other | Admitting: Adult Health

## 2018-04-10 VITALS — BP 134/72 | HR 63 | Temp 97.3°F | Ht 63.0 in | Wt 177.0 lb

## 2018-04-10 DIAGNOSIS — K219 Gastro-esophageal reflux disease without esophagitis: Secondary | ICD-10-CM

## 2018-04-10 DIAGNOSIS — Z0001 Encounter for general adult medical examination with abnormal findings: Secondary | ICD-10-CM

## 2018-04-10 DIAGNOSIS — E669 Obesity, unspecified: Secondary | ICD-10-CM

## 2018-04-10 DIAGNOSIS — N184 Chronic kidney disease, stage 4 (severe): Secondary | ICD-10-CM | POA: Diagnosis not present

## 2018-04-10 DIAGNOSIS — I10 Essential (primary) hypertension: Secondary | ICD-10-CM

## 2018-04-10 DIAGNOSIS — M150 Primary generalized (osteo)arthritis: Secondary | ICD-10-CM | POA: Diagnosis not present

## 2018-04-10 DIAGNOSIS — Z79899 Other long term (current) drug therapy: Secondary | ICD-10-CM | POA: Diagnosis not present

## 2018-04-10 DIAGNOSIS — E559 Vitamin D deficiency, unspecified: Secondary | ICD-10-CM

## 2018-04-10 DIAGNOSIS — N2581 Secondary hyperparathyroidism of renal origin: Secondary | ICD-10-CM | POA: Insufficient documentation

## 2018-04-10 DIAGNOSIS — M1000 Idiopathic gout, unspecified site: Secondary | ICD-10-CM

## 2018-04-10 DIAGNOSIS — K589 Irritable bowel syndrome without diarrhea: Secondary | ICD-10-CM

## 2018-04-10 DIAGNOSIS — E782 Mixed hyperlipidemia: Secondary | ICD-10-CM | POA: Diagnosis not present

## 2018-04-10 DIAGNOSIS — E2839 Other primary ovarian failure: Secondary | ICD-10-CM

## 2018-04-10 DIAGNOSIS — M159 Polyosteoarthritis, unspecified: Secondary | ICD-10-CM

## 2018-04-10 DIAGNOSIS — R6889 Other general symptoms and signs: Secondary | ICD-10-CM

## 2018-04-10 DIAGNOSIS — Z0000 Encounter for general adult medical examination without abnormal findings: Secondary | ICD-10-CM

## 2018-04-10 DIAGNOSIS — R7303 Prediabetes: Secondary | ICD-10-CM | POA: Diagnosis not present

## 2018-04-10 NOTE — Patient Instructions (Signed)
Aim for 7+ servings of fruits and vegetables daily  80+ fluid ounces of water or unsweet tea for healthy kidneys  1 drink of alcohol per day  Limit animal fats in diet for cholesterol and heart health - choose grass fed whenever available  Aim for low stress - take time to unwind and care for your mental health  Aim for 150 min of moderate intensity exercise weekly for heart health, and weights twice weekly for bone health  Aim for 7-9 hours of sleep daily      When it comes to diets, agreement about the perfect plan isn't easy to find, even among the experts. Experts at the [employer] developed an idea known as the Healthy Eating Plate. Just imagine a plate divided into logical, healthy portions.  The emphasis is on diet quality:  Load up on vegetables and fruits - one-half of your plate: Aim for color and variety, and remember that potatoes don't count.  Go for whole grains - one-quarter of your plate: Whole wheat, barley, wheat berries, quinoa, oats, brown rice, and foods made with them. If you want pasta, go with whole wheat pasta.  Protein power - one-quarter of your plate: Fish, chicken, beans, and nuts are all healthy, versatile protein sources. Limit red meat.  The diet, however, does go beyond the plate, offering a few other suggestions.  Use healthy plant oils, such as olive, canola, soy, corn, sunflower and peanut. Check the labels, and avoid partially hydrogenated oil, which have unhealthy trans fats.  If you're thirsty, drink water. Coffee and tea are good in moderation, but skip sugary drinks and limit milk and dairy products to one or two daily servings.  The type of carbohydrate in the diet is more important than the amount. Some sources of carbohydrates, such as vegetables, fruits, whole grains, and beans-are healthier than others.  Finally, stay active.

## 2018-04-11 LAB — CBC WITH DIFFERENTIAL/PLATELET
BASOS ABS: 42 {cells}/uL (ref 0–200)
BASOS PCT: 0.5 %
EOS ABS: 224 {cells}/uL (ref 15–500)
Eosinophils Relative: 2.7 %
HEMATOCRIT: 34.7 % — AB (ref 35.0–45.0)
HEMOGLOBIN: 11.9 g/dL (ref 11.7–15.5)
LYMPHS ABS: 2125 {cells}/uL (ref 850–3900)
MCH: 30.5 pg (ref 27.0–33.0)
MCHC: 34.3 g/dL (ref 32.0–36.0)
MCV: 89 fL (ref 80.0–100.0)
MONOS PCT: 8.1 %
MPV: 10.8 fL (ref 7.5–12.5)
NEUTROS ABS: 5237 {cells}/uL (ref 1500–7800)
Neutrophils Relative %: 63.1 %
PLATELETS: 245 10*3/uL (ref 140–400)
RBC: 3.9 10*6/uL (ref 3.80–5.10)
RDW: 14 % (ref 11.0–15.0)
Total Lymphocyte: 25.6 %
WBC: 8.3 10*3/uL (ref 3.8–10.8)
WBCMIX: 672 {cells}/uL (ref 200–950)

## 2018-04-11 LAB — LIPID PANEL
Cholesterol: 169 mg/dL (ref <200)
HDL: 44 mg/dL — ABNORMAL LOW (ref >50)
LDL Cholesterol (Calc): 96 mg/dL (calc)
Non-HDL Cholesterol (Calc): 125 mg/dL (calc) (ref <130)
Total CHOL/HDL Ratio: 3.8 (calc) (ref <5.0)
Triglycerides: 203 mg/dL — ABNORMAL HIGH (ref <150)

## 2018-04-11 LAB — TSH: TSH: 1.76 m[IU]/L (ref 0.40–4.50)

## 2018-04-11 LAB — HEMOGLOBIN A1C
HEMOGLOBIN A1C: 5.8 %{Hb} — AB (ref <5.7)
Mean Plasma Glucose: 120 (calc)
eAG (mmol/L): 6.6 (calc)

## 2018-04-22 ENCOUNTER — Other Ambulatory Visit: Payer: Self-pay | Admitting: Internal Medicine

## 2018-04-28 ENCOUNTER — Other Ambulatory Visit: Payer: Self-pay | Admitting: Internal Medicine

## 2018-05-25 NOTE — Progress Notes (Signed)
Assessment and Plan:  Ankle pain Appears to be very mild sprain, does not meet criteria for imaging Natural history and expected course discussed. Questions answered. Scientist, clinical (histocompatibility and immunogenetics) distributed. Fit with ankle brace for use over next a few weeks as needed while company is in house OTC analgesics as needed. Start gradual ROM, gentle calf stretches at night  Call back if back pain resumes  Further disposition pending results of labs. Discussed med's effects and SE's.   Over 15 minutes of exam, counseling, chart review, and critical decision making was performed.   Future Appointments  Date Time Provider [place]  06/18/2018  1:00 PM [facility] DX DEXA 1 [facility]  07/31/2018  2:30 PM Unk Pinto, MD [facility] None  02/11/2019  3:00 PM Unk Pinto, MD [facility] None  04/22/2019  3:45 PM Liane Comber, NP [facility] None    ------------------------------------------------------------------------------------------------------------------   HPI BP 140/72   Pulse 62   Temp (!) 97.5 F (36.4 C)   Ht 5\' 3"  (1.6 m)   Wt 182 lb (82.6 kg)   SpO2 98%   BMI 32.24 kg/m   77 y.o.female presents for evaluation of mild right ankle swelling, intermittent right calf tightness after mechanical fall 1 week ago. She reports she tripped up 3 steps after she got tangled in a comforter on the floor. Mild right ankle/calf pain  - achy/tight first thing in the morning, loosens up through the day, has ongoing intermittent mild right ankle pain, 4/10, achy if walks on it too long, but has been weight bearing without problems. She caught her fall on right hand, reports mild intermittent aching, but no point tenderness or pain.   She also reports she initially had lumbar pain with "sciatica" radiating pain on the right, but this is resolved and hasn't had pain in several days.   Past Medical History:  Diagnosis Date  . Cataract   . Chronic kidney disease   . DDD  (degenerative disc disease), lumbar   . DJD (degenerative joint disease)   . GERD (gastroesophageal reflux disease)   . Hyperlipidemia   . Hypertension   . IBS (irritable bowel syndrome)   . Prediabetes      Allergies  Allergen Reactions  . Ace Inhibitors     Pt unsure of reaction  . Feldene [Piroxicam] Rash    Current Outpatient Medications on File Prior to Visit  Medication Sig  . allopurinol (ZYLOPRIM) 300 MG tablet TAKE 1 TABLET BY MOUTH EVERY DAY  . atenolol (TENORMIN) 50 MG tablet TAKE 1 TABLET DAILY FOR BLOOD PRESSURE  . atorvastatin (LIPITOR) 20 MG tablet TAKE 1 TABLET DAILY AS DIRECTED FOR CHOLESTEROL  . BABY ASPIRIN PO Take 81 mg by mouth daily.  . calcitRIOL (ROCALTROL) 0.25 MCG capsule Take 0.25 mcg by mouth daily.  . furosemide (LASIX) 40 MG tablet TAKE 1 TABLET DAILY FOR BLOOD PRESSURE AND FLUID  . ketoconazole (NIZORAL) 2 % cream Apply 1 application topically 2 (two) times daily.  . mometasone (ELOCON) 0.1 % lotion Apply topically daily.  . Multiple Vitamins-Minerals (ICAPS AREDS 2 PO) Take 1 capsule by mouth daily.  . Multiple Vitamins-Minerals (MULTIVITAMIN PO) Take by mouth daily.  . Omega-3 Fatty Acids (FISH OIL PO) Take by mouth daily.  . ranitidine (ZANTAC) 300 MG tablet TAKE 1 TABLET TWICE A DAY FOR ACID INDIGESTION AND REFLUX   No current facility-administered medications on file prior to visit.     ROS: all negative except above.   Physical Exam:  BP  140/72   Pulse 62   Temp (!) 97.5 F (36.4 C)   Ht 5\' 3"  (1.6 m)   Wt 182 lb (82.6 kg)   SpO2 98%   BMI 32.24 kg/m   General Appearance: Well nourished, in no apparent distress. Neck: Supple.  Respiratory: Respiratory effort normal Cardio: Brisk peripheral pulses. Calves symmetrical 38 cm Lymphatics: Non tender without lymphadenopathy.  Musculoskeletal: Full ROM, no palpable bony abnormality or point tenderness, 5/5 strength, mild generalized swelling to right ankle/pedal area, antalgic gait,  but able to bear weight without pain. Right anatomical snuff box without tenderness or edema.  Skin: Warm, dry without rashes, lesions, ecchymosis.  Neuro: Cranial nerves intact. Normal muscle tone. Sensation intact.  Psych: Awake and oriented X 3, normal affect, Insight and Judgment appropriate.     Izora Ribas, NP 11:58 AM [facility];[place]

## 2018-05-26 ENCOUNTER — Encounter: Payer: Self-pay | Admitting: Adult Health

## 2018-05-26 ENCOUNTER — Ambulatory Visit (INDEPENDENT_AMBULATORY_CARE_PROVIDER_SITE_OTHER): Payer: Medicare Other | Admitting: Adult Health

## 2018-05-26 VITALS — BP 140/72 | HR 62 | Temp 97.5°F | Ht 63.0 in | Wt 182.0 lb

## 2018-05-26 DIAGNOSIS — S93401A Sprain of unspecified ligament of right ankle, initial encounter: Secondary | ICD-10-CM

## 2018-05-26 DIAGNOSIS — M25571 Pain in right ankle and joints of right foot: Secondary | ICD-10-CM | POA: Diagnosis not present

## 2018-05-26 NOTE — Patient Instructions (Signed)
Can take tylenol, if back pain comes back can call for prednisone/muslce relaxer   Ankle Sprain An ankle sprain is a stretch or tear in one of the tough tissues (ligaments) in your ankle. Follow these instructions at home:  Rest your ankle.  Take over-the-counter and prescription medicines only as told by your doctor.  For 2-3 days, keep your ankle higher than the level of your heart (elevated) as much as possible.  If directed, put ice on the area: ? Put ice in a plastic bag. ? Place a towel between your skin and the bag. ? Leave the ice on for 20 minutes, 2-3 times a day.  If you were given a brace: ? Wear it as told. ? Take it off to shower or bathe. ? Try not to move your ankle much, but wiggle your toes from time to time. This helps to prevent swelling.  If you were given an elastic bandage (dressing): ? Take it off when you shower or bathe. ? Try not to move your ankle much, but wiggle your toes from time to time. This helps to prevent swelling. ? Adjust the bandage to make it more comfortable if it feels too tight. ? Loosen the bandage if you lose feeling in your foot, your foot tingles, or your foot gets cold and blue.  If you have crutches, use them as told by your doctor. Continue to use them until you can walk without feeling pain in your ankle. Contact a doctor if:  Your bruises or swelling are quickly getting worse.  Your pain does not get better after you take medicine. Get help right away if:  You cannot feel your toes or foot.  Your toes or your foot looks blue.  You have very bad pain that gets worse. This information is not intended to replace advice given to you by your health care provider. Make sure you discuss any questions you have with your health care provider. Document Released: 05/07/2008 Document Revised: 04/26/2016 Document Reviewed: 06/21/2015 Elsevier Interactive Patient Education  [employer].

## 2018-05-28 ENCOUNTER — Other Ambulatory Visit: Payer: Self-pay | Admitting: Adult Health

## 2018-05-28 MED ORDER — BACLOFEN 10 MG PO TABS: ORAL_TABLET | ORAL | 1 refills | Status: DC

## 2018-05-28 MED ORDER — PREDNISONE 20 MG PO TABS: ORAL_TABLET | ORAL | 0 refills | Status: DC

## 2018-05-28 NOTE — Progress Notes (Signed)
Patient called back to report mild back pain resumed; will try short taper of prednisone and low dose muscle relaxer. Discussed to limit use of muscle relaxer, take lowest dose needed, not to drive or take with sedating meds or alcohol.

## 2018-06-16 ENCOUNTER — Encounter: Payer: Self-pay | Admitting: Internal Medicine

## 2018-06-16 ENCOUNTER — Ambulatory Visit (INDEPENDENT_AMBULATORY_CARE_PROVIDER_SITE_OTHER): Payer: Medicare Other | Admitting: Internal Medicine

## 2018-06-16 VITALS — BP 140/72 | HR 60 | Temp 97.4°F | Resp 16 | Ht 63.0 in | Wt 178.8 lb

## 2018-06-16 DIAGNOSIS — J042 Acute laryngotracheitis: Secondary | ICD-10-CM

## 2018-06-16 MED ORDER — BENZONATATE 200 MG PO CAPS: ORAL_CAPSULE | ORAL | 1 refills | Status: DC

## 2018-06-16 MED ORDER — PROMETHAZINE-DM 6.25-15 MG/5ML PO SYRP: ORAL_SOLUTION | ORAL | 1 refills | Status: DC

## 2018-06-16 MED ORDER — PREDNISONE 20 MG PO TABS: ORAL_TABLET | ORAL | 0 refills | Status: DC

## 2018-06-16 MED ORDER — AZITHROMYCIN 250 MG PO TABS
ORAL_TABLET | ORAL | 1 refills | Status: DC
Start: 2018-06-16 — End: 2018-07-31

## 2018-06-16 NOTE — Progress Notes (Signed)
Subjective:    Patient ID: Kelly Cantu, female    DOB: 1941-12-03, 77 y.o.   MRN: [id number]  HPI    This delightful 77 yo [name] with HTN, T2_DM/CKD4, Gout, HLD and Vit D Deficiency presents with 5 day prodrome of worsening hoarseness, laryngitis, dry cough, fever/chills /sweats.  Medication Sig  . allopurinol (ZYLOPRIM) 300 MG tablet TAKE 1 TABLET BY MOUTH EVERY DAY  . atenolol (TENORMIN) 50 MG tablet TAKE 1 TABLET DAILY FOR BLOOD PRESSURE  . atorvastatin (LIPITOR) 20 MG tablet TAKE 1 TABLET DAILY AS DIRECTED FOR CHOLESTEROL  . BABY ASPIRIN PO Take 81 mg by mouth daily.  . calcitRIOL  0.25 MCG capsule Take 0.25 mcg by mouth daily.  . furosemide (LASIX) 40 MG tablet TAKE 1 TABLET DAILY FOR BLOOD PRESSURE AND FLUID  . ketoconazole (NIZORAL) 2 % cream Apply 1 application topically 2 (two) times daily.  . mometasone (ELOCON) 0.1 % lotion Apply topically daily.  . Multiple Vitamins-Minerals (ICAPS AREDS 2 PO) Take 1 capsule by mouth daily.  . Multiple Vitamins-Minerals (MULTIVITAMIN PO) Take by mouth daily.  . Omega-3 Fatty Acids (FISH OIL PO) Take by mouth daily.  . ranitidine (ZANTAC) 300 MG tablet TAKE 1 TABLET TWICE A DAY    Allergies  Allergen Reactions  . Ace Inhibitors     Pt unsure of reaction  . Feldene [Piroxicam] Rash   Past Medical History:  Diagnosis Date  . Cataract   . Chronic kidney disease   . DDD (degenerative disc disease), lumbar   . DJD (degenerative joint disease)   . GERD (gastroesophageal reflux disease)   . Hyperlipidemia   . Hypertension   . IBS (irritable bowel syndrome)   . Prediabetes    Review of Systems   10 point systems review negative except as above.    Objective:   Physical Exam  BP 140/72   Pulse 60   Temp (!) 97.4 F (36.3 C)   Resp 16   Ht 5\' 3"  (1.6 m)   Wt 178 lb 12.8 oz (81.1 kg)   BMI 31.67 kg/m   Hoarse, raspy, laryngiti                                                                                                         HEENT - WNL. Neck - supple.  Chest - Clear equal BS. Cor - Nl HS. RRR w/o sig MGR. PP 1(+). No edema. MS- FROM w/o deformities.  Gait Nl. Neuro -  Nl w/o focal abnormalities.    Assessment & Plan:   1. Laryngotracheitis  - predniSONE (DELTASONE) 20 MG tablet; 1 tab 3 x day for 3 days, then 1 tab 2 x day for 3 days, then 1 tab 1 x day for 5 days  Dispense: 20 tablet  - azithromycin (ZITHROMAX) 250 MG tablet; Take 2 tablets (500 mg) on  Day 1,  followed by 1 tablet (250 mg) once daily on Days 2 through 5.  Dispense: 6 each; Refill: 1  - promethazine-dextromethorphan (PROMETHAZINE-DM) 6.25-15 MG/5ML syrup; Take 1 to 2 tsp  enery 4 hours if needed for cough  Dispense: 360 mL; Refill: 1  - benzonatate (TESSALON) 200 MG capsule; Take 1 perle 3 x / day to prevent cough  Dispense: 30 capsule; Refill: 1

## 2018-06-18 ENCOUNTER — Other Ambulatory Visit: Payer: Medicare Other

## 2018-07-15 DIAGNOSIS — M109 Gout, unspecified: Secondary | ICD-10-CM | POA: Diagnosis not present

## 2018-07-15 DIAGNOSIS — E782 Mixed hyperlipidemia: Secondary | ICD-10-CM | POA: Diagnosis not present

## 2018-07-15 DIAGNOSIS — N2581 Secondary hyperparathyroidism of renal origin: Secondary | ICD-10-CM | POA: Diagnosis not present

## 2018-07-15 DIAGNOSIS — D631 Anemia in chronic kidney disease: Secondary | ICD-10-CM | POA: Diagnosis not present

## 2018-07-15 DIAGNOSIS — M1990 Unspecified osteoarthritis, unspecified site: Secondary | ICD-10-CM | POA: Diagnosis not present

## 2018-07-15 DIAGNOSIS — N183 Chronic kidney disease, stage 3 (moderate): Secondary | ICD-10-CM | POA: Diagnosis not present

## 2018-07-15 DIAGNOSIS — E669 Obesity, unspecified: Secondary | ICD-10-CM | POA: Diagnosis not present

## 2018-07-15 DIAGNOSIS — I129 Hypertensive chronic kidney disease with stage 1 through stage 4 chronic kidney disease, or unspecified chronic kidney disease: Secondary | ICD-10-CM | POA: Diagnosis not present

## 2018-07-29 ENCOUNTER — Ambulatory Visit
Admission: RE | Admit: 2018-07-29 | Discharge: 2018-07-29 | Disposition: A | Discharge status: Home / Self Care | Payer: Medicare Other | Source: Ambulatory Visit | Attending: Adult Health | Admitting: Adult Health

## 2018-07-29 DIAGNOSIS — Z780 Asymptomatic menopausal state: Secondary | ICD-10-CM | POA: Diagnosis not present

## 2018-07-29 DIAGNOSIS — E2839 Other primary ovarian failure: Secondary | ICD-10-CM

## 2018-07-29 DIAGNOSIS — Z13820 Encounter for screening for osteoporosis: Secondary | ICD-10-CM | POA: Diagnosis not present

## 2018-07-31 ENCOUNTER — Ambulatory Visit (INDEPENDENT_AMBULATORY_CARE_PROVIDER_SITE_OTHER): Payer: Medicare Other | Admitting: Internal Medicine

## 2018-07-31 ENCOUNTER — Encounter: Payer: Self-pay | Admitting: Internal Medicine

## 2018-07-31 VITALS — BP 140/84 | HR 60 | Temp 97.9°F | Ht 63.0 in | Wt 173.0 lb

## 2018-07-31 DIAGNOSIS — E1122 Type 2 diabetes mellitus with diabetic chronic kidney disease: Secondary | ICD-10-CM | POA: Insufficient documentation

## 2018-07-31 DIAGNOSIS — M1000 Idiopathic gout, unspecified site: Secondary | ICD-10-CM

## 2018-07-31 DIAGNOSIS — E782 Mixed hyperlipidemia: Secondary | ICD-10-CM

## 2018-07-31 DIAGNOSIS — Z79899 Other long term (current) drug therapy: Secondary | ICD-10-CM | POA: Diagnosis not present

## 2018-07-31 DIAGNOSIS — E559 Vitamin D deficiency, unspecified: Secondary | ICD-10-CM | POA: Diagnosis not present

## 2018-07-31 DIAGNOSIS — I10 Essential (primary) hypertension: Secondary | ICD-10-CM

## 2018-07-31 DIAGNOSIS — N184 Chronic kidney disease, stage 4 (severe): Secondary | ICD-10-CM | POA: Insufficient documentation

## 2018-07-31 DIAGNOSIS — Z87891 Personal history of nicotine dependence: Secondary | ICD-10-CM | POA: Insufficient documentation

## 2018-07-31 DIAGNOSIS — Z8249 Family history of ischemic heart disease and other diseases of the circulatory system: Secondary | ICD-10-CM | POA: Insufficient documentation

## 2018-07-31 NOTE — Patient Instructions (Signed)

## 2018-07-31 NOTE — Progress Notes (Signed)
This very nice 77 y.o. [name] presents for 6 month follow up with HTN, HLD, Pre-Diabetes and Vitamin D Deficiency. Patient has CKD4 followed by Dr Deterding. She also has hx/o Gout controlled on her meds:     Patient is treated for HTN (1991) & BP has been controlled at home. Today's BP is at goal - 140/84. Patient has had no complaints of any cardiac type chest pain, palpitations, dyspnea / orthopnea / PND, dizziness, claudication, or dependent edema.     Hyperlipidemia is controlled with diet & meds. Patient denies myalgias or other med SE's. Last Lipids were at goal albeit sl elevated Trig's:  Lab Results  Component Value Date   CHOL 169 04/10/2018   HDL 44 (L) 04/10/2018   LDLCALC 96 04/10/2018   TRIG 203 (H) 04/10/2018   CHOLHDL 3.8 04/10/2018      Also, the patient has history of Prediabetes (A1c 6.0%/2014) and then T2_DM (A1c 6.6%/Mar2017) and has had no symptoms of reactive hypoglycemia, diabetic polys, paresthesias or visual blurring.  Last A1c was near goal: Lab Results  Component Value Date   HGBA1C 5.8 (H) 04/10/2018      Further, the patient also has history of Vitamin D Deficiency ("34"/2008)  and supplements vitamin D without any suspected side-effects. Last vitamin D was still low: Lab Results  Component Value Date   VD25OH 40 01/06/2018   Current Outpatient Medications on File Prior to Visit  Medication Sig  . allopurinol (ZYLOPRIM) 300 MG tablet TAKE 1 TABLET BY MOUTH EVERY DAY  . atenolol (TENORMIN) 50 MG tablet TAKE 1 TABLET DAILY FOR BLOOD PRESSURE  . atorvastatin (LIPITOR) 20 MG tablet TAKE 1 TABLET DAILY AS DIRECTED FOR CHOLESTEROL  . BABY ASPIRIN PO Take 81 mg by mouth daily.  . calcitRIOL (ROCALTROL) 0.25 MCG capsule Take 0.25 mcg by mouth daily.  . furosemide (LASIX) 40 MG tablet TAKE 1 TABLET DAILY FOR BLOOD PRESSURE AND FLUID  . ketoconazole (NIZORAL) 2 % cream Apply 1 application topically 2 (two) times daily.  . mometasone (ELOCON) 0.1 % lotion Apply  topically daily.  . Multiple Vitamins-Minerals (ICAPS AREDS 2 PO) Take 1 capsule by mouth daily.  . Multiple Vitamins-Minerals (MULTIVITAMIN PO) Take by mouth daily.  . Omega-3 Fatty Acids (FISH OIL PO) Take by mouth daily.  . ranitidine (ZANTAC) 300 MG tablet TAKE 1 TABLET TWICE A DAY FOR ACID INDIGESTION AND REFLUX   No current facility-administered medications on file prior to visit.    Allergies  Allergen Reactions  . Ace Inhibitors     Pt unsure of reaction  . Feldene [Piroxicam] Rash   PMHx:   Past Medical History:  Diagnosis Date  . Cataract   . Chronic kidney disease   . DDD (degenerative disc disease), lumbar   . DJD (degenerative joint disease)   . GERD (gastroesophageal reflux disease)   . Hyperlipidemia   . Hypertension   . IBS (irritable bowel syndrome)   . Prediabetes    Immunization History  Administered Date(s) Administered  . DT 09/29/2014, 09/20/2015  . Influenza Split 09/28/2013  . Influenza, High Dose Seasonal PF 10/13/2014, 09/03/2016, 10/01/2017  . Influenza-Unspecified 08/04/2015  . Pneumococcal Conjugate-13 10/13/2014  . Pneumococcal Polysaccharide-23 09/23/2012  . Td 09/02/2004  . Zoster 10/13/2013   Past Surgical History:  Procedure Laterality Date  . ABDOMINAL HYSTERECTOMY    . CATARACT EXTRACTION    . COLONOSCOPY  x 2   w/Dr Carlean Purl   FHx:  Reviewed / unchanged  SHx:    Reviewed / unchanged   Systems Review:  Constitutional: Denies fever, chills, wt changes, headaches, insomnia, fatigue, night sweats, change in appetite. Eyes: Denies redness, blurred vision, diplopia, discharge, itchy, watery eyes.  ENT: Denies discharge, congestion, post nasal drip, epistaxis, sore throat, earache, hearing loss, dental pain, tinnitus, vertigo, sinus pain, snoring.  CV: Denies chest pain, palpitations, irregular heartbeat, syncope, dyspnea, diaphoresis, orthopnea, PND, claudication or edema. Respiratory: denies cough, dyspnea, DOE, pleurisy,  hoarseness, laryngitis, wheezing.  Gastrointestinal: Denies dysphagia, odynophagia, heartburn, reflux, water brash, abdominal pain or cramps, nausea, vomiting, bloating, diarrhea, constipation, hematemesis, melena, hematochezia  or hemorrhoids. Genitourinary: Denies dysuria, frequency, urgency, nocturia, hesitancy, discharge, hematuria or flank pain. Musculoskeletal: Denies arthralgias, myalgias, stiffness, jt. swelling, pain, limping or strain/sprain.  Skin: Denies pruritus, rash, hives, warts, acne, eczema or change in skin lesion(s). Neuro: No weakness, tremor, incoordination, spasms, paresthesia or pain. Psychiatric: Denies confusion, memory loss or sensory loss. Endo: Denies change in weight, skin or hair change.  Heme/Lymph: No excessive bleeding, bruising or enlarged lymph nodes.  Physical Exam  BP 140/84   Pulse 60   Temp 97.9 F (36.6 C)   Ht 5\' 3"  (1.6 m)   Wt 173 lb (78.5 kg)   SpO2 98%   BMI 30.65 kg/m   Appears  well nourished, well groomed  and in no distress.  Eyes: PERRLA, EOMs, conjunctiva no swelling or erythema. Sinuses: No frontal/maxillary tenderness ENT/Mouth: EAC's clear, TM's nl w/o erythema, bulging. Nares clear w/o erythema, swelling, exudates. Oropharynx clear without erythema or exudates. Oral hygiene is good. Tongue normal, non obstructing. Hearing intact.  Neck: Supple. Thyroid not palpable. Car 2+/2+ without bruits, nodes or JVD. Chest: Respirations nl with BS clear & equal w/o rales, rhonchi, wheezing or stridor.  Cor: Heart sounds normal w/ regular rate and rhythm without sig. murmurs, gallops, clicks or rubs. Peripheral pulses normal and equal  without edema.  Abdomen: Soft & bowel sounds normal. Non-tender w/o guarding, rebound, hernias, masses or organomegaly.  Lymphatics: Unremarkable.  Musculoskeletal: Full ROM all peripheral extremities, joint stability, 5/5 strength and normal gait.  Skin: Warm, dry without exposed rashes, lesions or ecchymosis  apparent.  Neuro: Cranial nerves intact, reflexes equal bilaterally. Sensory-motor testing grossly intact. Tendon reflexes grossly intact.  Pysch: Alert & oriented x 3.  Insight and judgement nl & appropriate. No ideations.  Assessment and Plan:  1. Essential hypertension  - Continue medication, monitor blood pressure at home.  - Continue DASH diet.  Reminder to go to the ER if any CP,  SOB, nausea, dizziness, severe HA, changes vision/speech.  - CBC with Differential/Platelet - COMPLETE METABOLIC PANEL WITH GFR - Magnesium - TSH  2. Hyperlipidemia, mixed  - Continue diet/meds, exercise,& lifestyle modifications.  - Continue monitor periodic cholesterol/liver & renal functions   - Lipid panel - TSH  3. Type 2 diabetes mellitus with stage 4 chronic kidney disease, without long-term current use of insulin (HCC)  - Continue diet, exercise, lifestyle modifications.  - Monitor appropriate labs.  - Hemoglobin A1c - Insulin, random  4. Vitamin D deficiency  - Continue supplementation.  - VITAMIN D 25 Hydroxyl  5. Idiopathic gout  - Uric acid  6. Medication management  - CBC with Differential/Platelet - COMPLETE METABOLIC PANEL WITH GFR - Magnesium - Lipid panel - TSH - Hemoglobin A1c - Insulin, random - Uric acid - VITAMIN D 25 Hydroxyl       Discussed  regular exercise, BP monitoring, weight  control to achieve/maintain BMI less than 25 and discussed med and SE's. Recommended labs to assess and monitor clinical status with further disposition pending results of labs. Over 30 minutes of exam, counseling, chart review was performed.

## 2018-08-01 LAB — COMPLETE METABOLIC PANEL WITH GFR
AG Ratio: 1.9 (calc) (ref 1.0–2.5)
ALKALINE PHOSPHATASE (APISO): 117 U/L (ref 33–130)
ALT: 17 U/L (ref 6–29)
AST: 21 U/L (ref 10–35)
Albumin: 4.3 g/dL (ref 3.6–5.1)
BUN/Creatinine Ratio: 20 (calc) (ref 6–22)
BUN: 34 mg/dL — AB (ref 7–25)
CALCIUM: 10.1 mg/dL (ref 8.6–10.4)
CO2: 31 mmol/L (ref 20–32)
CREATININE: 1.68 mg/dL — AB (ref 0.60–0.93)
Chloride: 100 mmol/L (ref 98–110)
GFR, EST NON AFRICAN AMERICAN: 29 mL/min/{1.73_m2} — AB (ref >=60)
GFR, Est African American: 34 mL/min/{1.73_m2} — ABNORMAL LOW (ref >=60)
GLOBULIN: 2.3 g/dL (ref 1.9–3.7)
GLUCOSE: 93 mg/dL (ref 65–99)
Potassium: 4.1 mmol/L (ref 3.5–5.3)
SODIUM: 141 mmol/L (ref 135–146)
Total Bilirubin: 0.8 mg/dL (ref 0.2–1.2)
Total Protein: 6.6 g/dL (ref 6.1–8.1)

## 2018-08-01 LAB — URIC ACID: Uric Acid, Serum: 4.3 mg/dL (ref 2.5–7.0)

## 2018-08-01 LAB — HEMOGLOBIN A1C
Hgb A1c MFr Bld: 6 % of total Hgb — ABNORMAL HIGH (ref <5.7)
Mean Plasma Glucose: 126 (calc)
eAG (mmol/L): 7 (calc)

## 2018-08-01 LAB — CBC WITH DIFFERENTIAL/PLATELET
Basophils Absolute: 37 cells/uL (ref 0–200)
Basophils Relative: 0.4 %
EOS PCT: 1.9 %
Eosinophils Absolute: 177 cells/uL (ref 15–500)
HEMATOCRIT: 36.9 % (ref 35.0–45.0)
HEMOGLOBIN: 12.5 g/dL (ref 11.7–15.5)
LYMPHS ABS: 2027 {cells}/uL (ref 850–3900)
MCH: 29.7 pg (ref 27.0–33.0)
MCHC: 33.9 g/dL (ref 32.0–36.0)
MCV: 87.6 fL (ref 80.0–100.0)
MONOS PCT: 8.4 %
MPV: 10.9 fL (ref 7.5–12.5)
NEUTROS ABS: 6278 {cells}/uL (ref 1500–7800)
NEUTROS PCT: 67.5 %
Platelets: 242 10*3/uL (ref 140–400)
RBC: 4.21 10*6/uL (ref 3.80–5.10)
RDW: 14.1 % (ref 11.0–15.0)
Total Lymphocyte: 21.8 %
WBC mixed population: 781 cells/uL (ref 200–950)
WBC: 9.3 10*3/uL (ref 3.8–10.8)

## 2018-08-01 LAB — LIPID PANEL
CHOL/HDL RATIO: 3.8 (calc) (ref <5.0)
CHOLESTEROL: 173 mg/dL (ref <200)
HDL: 46 mg/dL — AB (ref >50)
LDL CHOLESTEROL (CALC): 96 mg/dL
Non-HDL Cholesterol (Calc): 127 mg/dL (calc) (ref <130)
TRIGLYCERIDES: 211 mg/dL — AB (ref <150)

## 2018-08-01 LAB — TSH: TSH: 1.98 m[IU]/L (ref 0.40–4.50)

## 2018-08-01 LAB — MAGNESIUM: MAGNESIUM: 2.1 mg/dL (ref 1.5–2.5)

## 2018-08-01 LAB — VITAMIN D 25 HYDROXY (VIT D DEFICIENCY, FRACTURES): Vit D, 25-Hydroxy: 38 ng/mL (ref 30–100)

## 2018-08-01 LAB — INSULIN, RANDOM: Insulin: 12.8 u[IU]/mL (ref 2.0–19.6)

## 2018-10-19 ENCOUNTER — Other Ambulatory Visit: Payer: Self-pay | Admitting: Internal Medicine

## 2018-10-25 ENCOUNTER — Other Ambulatory Visit: Payer: Self-pay | Admitting: Internal Medicine

## 2018-11-02 NOTE — Progress Notes (Signed)
Assessment and Plan:   Essential hypertension - continue medications, DASH diet, exercise and monitor at home. Call if greater than 130/80.  -     CBC with Differential/Platelet -     COMPLETE METABOLIC PANEL WITH GFR -     TSH  Secondary hyperparathyroidism of renal origin ([place]) Monitor  Type 2 diabetes mellitus with stage 4 chronic kidney disease, without long-term current use of insulin ([place]) Discussed general issues about diabetes pathophysiology and management., Educational material distributed., Suggested low cholesterol diet., Encouraged aerobic exercise., Discussed foot care., Reminded to get yearly retinal exam. -     Hemoglobin A1c  CKD (chronic kidney disease) stage 4, GFR 15-29 ml/min (HCC) Increase fluids, avoid NSAIDS, monitor sugars, will monitor -     COMPLETE METABOLIC PANEL WITH GFR  Hyperlipidemia, mixed -     Lipid panel  Medication management -     Magnesium  Needs flu shot -     Flu vaccine HIGH DOSE PF  Nasal congestion -     fluticasone (FLONASE) 50 MCG/ACT nasal spray; Place 2 sprays into both nostrils at bedtime.  Polyarthralgia Likely OA but some ulnar devidation versus nodes Family history Check labs Do tylenol If neg will refer ortho, if + will refer rhuem -     ANA -     Anti-DNA antibody, double-stranded -     Rheumatoid factor -     RNP Antibody     Continue diet and meds as discussed. Further disposition pending results of labs. Future Appointments  Date Time Provider [place]  02/11/2019  3:00 PM Unk Pinto, MD [facility] None  04/22/2019  3:45 PM Liane Comber, NP [facility] None    HPI 77 y.o. female  presents for 3 month follow up with hypertension, hyperlipidemia, prediabetes and vitamin D.  She is on allergy pill but states she is drowsy with those, she has sinus congestion/fullness in the AM, gets better, some runny nose, no fever, chills, SOB.   She is right handed, states she is having bilateral  thumb/little finger pain, some warmth, and she has pain at left MTP joint, bilateral feet pain. She states her father had OA unknown if autoimmune but her sister has RA.    Her blood pressure has been controlled at home, today their BP is BP: 130/74.   She does workout. She denies chest pain, shortness of breath, dizziness.   She is on cholesterol medication and denies myalgias. Her cholesterol is at goal. The cholesterol last visit was:   Lab Results  Component Value Date   CHOL 173 07/31/2018   HDL 46 (L) 07/31/2018   LDLCALC 96 07/31/2018   TRIG 211 (H) 07/31/2018   CHOLHDL 3.8 07/31/2018    She has been working on diet and exercise for diabetes with CKD, last visit she was in the DM range but she has gotten it down with diet, and denies foot ulcerations, hyperglycemia, hypoglycemia , increased appetite, nausea, paresthesia of the feet, polydipsia, polyuria, visual disturbances, vomiting and weight loss. Last A1C in the office was:  Lab Results  Component Value Date   HGBA1C 6.0 (H) 07/31/2018   She has CKD due to HTN/DM2 Lab Results  Component Value Date   GFRNONAA 29 (L) 07/31/2018   Patient is on Vitamin D supplement.  Lab Results  Component Value Date   VD25OH 38 07/31/2018     BMI is Body mass index is 31.39 kg/m., she is working on diet and exercise. Wt Readings from  Last 3 Encounters:  11/03/18 177 lb 3.2 oz (80.4 kg)  07/31/18 173 lb (78.5 kg)  06/16/18 178 lb 12.8 oz (81.1 kg)   Patient is on allopurinol for gout and does not report a recent flare.  Lab Results  Component Value Date   LABURIC 4.3 07/31/2018   Current Medications:  Current Outpatient Medications on File Prior to Visit  Medication Sig Dispense Refill  . allopurinol (ZYLOPRIM) 300 MG tablet TAKE 1 TABLET BY MOUTH EVERY DAY 90 tablet 99  . atenolol (TENORMIN) 50 MG tablet TAKE 1 TABLET DAILY FOR BLOOD PRESSURE 90 tablet 3  . atorvastatin (LIPITOR) 20 MG tablet TAKE 1 TABLET DAILY AS DIRECTED FOR  CHOLESTEROL 90 tablet 3  . BABY ASPIRIN PO Take 81 mg by mouth daily.    . calcitRIOL (ROCALTROL) 0.25 MCG capsule Take 0.25 mcg by mouth daily.    . furosemide (LASIX) 40 MG tablet TAKE 1 TABLET DAILY FOR BLOOD PRESSURE AND FLUID 90 tablet 3  . Multiple Vitamins-Minerals (ICAPS AREDS 2 PO) Take 1 capsule by mouth daily.    . Multiple Vitamins-Minerals (MULTIVITAMIN PO) Take by mouth daily.    . Omega-3 Fatty Acids (FISH OIL PO) Take by mouth daily.    . ranitidine (ZANTAC) 300 MG tablet TAKE 1 TABLET TWICE A DAY FOR ACID INDIGESTION AND REFLUX 180 tablet 3   No current facility-administered medications on file prior to visit.     Medical History:  Past Medical History:  Diagnosis Date  . Cataract   . Chronic kidney disease   . DDD (degenerative disc disease), lumbar   . DJD (degenerative joint disease)   . GERD (gastroesophageal reflux disease)   . Hyperlipidemia   . Hypertension   . IBS (irritable bowel syndrome)   . Prediabetes     Allergies:  Allergies  Allergen Reactions  . Ace Inhibitors     Pt unsure of reaction  . Feldene [Piroxicam] Rash     Review of Systems:  Review of Systems  Constitutional: Negative for chills, fever and malaise/fatigue.  HENT: Negative for congestion, ear pain and sore throat.   Eyes: Negative.   Respiratory: Negative for cough, shortness of breath and wheezing.   Cardiovascular: Negative for chest pain, palpitations and leg swelling.  Gastrointestinal: Negative for blood in stool, constipation, diarrhea, heartburn and melena.  Genitourinary: Negative.   Musculoskeletal: Positive for joint pain.  Neurological: Negative for dizziness, loss of consciousness and headaches.  Psychiatric/Behavioral: Negative for depression. The patient is not nervous/anxious and does not have insomnia.     Family history- Review and unchanged  Social history- Review and unchanged  Physical Exam: BP 130/74   Pulse (!) 58   Temp 97.8 F (36.6 C)   Ht  5\' 3"  (1.6 m)   Wt 177 lb 3.2 oz (80.4 kg)   SpO2 98%   BMI 31.39 kg/m  Wt Readings from Last 3 Encounters:  11/03/18 177 lb 3.2 oz (80.4 kg)  07/31/18 173 lb (78.5 kg)  06/16/18 178 lb 12.8 oz (81.1 kg)   General Appearance: Well nourished well developed, in no apparent distress. Eyes: PERRLA, EOMs, conjunctiva no swelling or erythema ENT/Mouth: Ear canals normal without obstruction, swelling, erythma, discharge.  TMs normal bilaterally.  Oropharynx moist, clear, without exudate, or postoropharyngeal swelling. Neck: Supple, thyroid normal,no cervical adenopathy  Respiratory: Respiratory effort normal, Breath sounds clear A&P without rhonchi, wheeze, or rale.  No retractions, no accessory usage. Cardio: RRR with no MRGs. Brisk peripheral pulses without  edema.  Abdomen: Soft, + BS,  Non tender, no guarding, rebound, hernias, masses. Musculoskeletal: Full ROM, 5/5 strength, Normal gait, nodes bilateral hands at DIP/PIP, questionable ulnar deviation, neg finklestein test.  Skin: Warm, dry without rashes, lesions, ecchymosis.  Neuro: Awake and oriented X 3, Cranial nerves intact. Normal muscle tone, no cerebellar symptoms. Psych: Normal affect, Insight and Judgment appropriate.    Vicie Mutters, PA-C 2:07 PM [facility]

## 2018-11-03 ENCOUNTER — Ambulatory Visit (INDEPENDENT_AMBULATORY_CARE_PROVIDER_SITE_OTHER): Payer: Medicare Other | Admitting: Physician Assistant

## 2018-11-03 ENCOUNTER — Encounter: Payer: Self-pay | Admitting: Physician Assistant

## 2018-11-03 VITALS — BP 130/74 | HR 58 | Temp 97.8°F | Ht 63.0 in | Wt 177.2 lb

## 2018-11-03 DIAGNOSIS — N2581 Secondary hyperparathyroidism of renal origin: Secondary | ICD-10-CM | POA: Diagnosis not present

## 2018-11-03 DIAGNOSIS — E1122 Type 2 diabetes mellitus with diabetic chronic kidney disease: Secondary | ICD-10-CM | POA: Diagnosis not present

## 2018-11-03 DIAGNOSIS — N184 Chronic kidney disease, stage 4 (severe): Secondary | ICD-10-CM

## 2018-11-03 DIAGNOSIS — Z23 Encounter for immunization: Secondary | ICD-10-CM

## 2018-11-03 DIAGNOSIS — R0981 Nasal congestion: Secondary | ICD-10-CM | POA: Diagnosis not present

## 2018-11-03 DIAGNOSIS — Z79899 Other long term (current) drug therapy: Secondary | ICD-10-CM | POA: Diagnosis not present

## 2018-11-03 DIAGNOSIS — I10 Essential (primary) hypertension: Secondary | ICD-10-CM

## 2018-11-03 DIAGNOSIS — E782 Mixed hyperlipidemia: Secondary | ICD-10-CM | POA: Diagnosis not present

## 2018-11-03 DIAGNOSIS — M2550 Pain in unspecified joint: Secondary | ICD-10-CM | POA: Diagnosis not present

## 2018-11-03 MED ORDER — RANITIDINE HCL 300 MG PO TABS: ORAL_TABLET | ORAL | 3 refills | Status: DC

## 2018-11-03 MED ORDER — FLUTICASONE PROPIONATE 50 MCG/ACT NA SUSP: 2.0000 | Freq: Every day | NASAL | 1 refills | Status: DC

## 2018-11-03 NOTE — Patient Instructions (Addendum)
MAXIMUM AMOUNT OF TYLENOL IN A DAY  You can take tylenol (500mg ) or tylenol arthritis (650mg ) with the meloxicam/antiinflammatories. The max you can take of tylenol a day is 3000mg  daily, this is a max of 6 pills a day of the regular tyelnol (500mg ) or a max of 4 a day of the tylenol arthritis (650mg ) as long as no other medications you are taking contain tylenol.   Aleve is an antiinflammatory, can take 1 pill twice a day for 2 weeks and then take as needed.  Aleve can cause inflammation in your stomach and can cause ulcers or bleeding, this will look like black tarry stools Make sure you take your aleve with food Can take with zantac   Can do a steroid nasal spary 1-2 sparys at night each nostril. Remember to spray each nostril twice towards the outer part of your eye.  Do not sniff but instead pinch your nose and tilt your head back to help the medicine get into your sinuses.  The best time to do this is at bedtime. Stop if you get blurred vision or nose bleeds.    HOW TO TREAT VIRAL COUGH AND COLD SYMPTOMS:  -Symptoms usually last at least 1 week with the worst symptoms being around day 4.  - colds usually start with a sore throat and end with a cough, and the cough can take 2 weeks to get better.  -No antibiotics are needed for colds, flu, sore throats, cough, bronchitis UNLESS symptoms are longer than 7 days OR if you are getting better then get drastically worse.  -There are a lot of combination medications (Dayquil, Nyquil, Vicks 44, tyelnol cold and sinus, ETC). Please look at the ingredients on the back so that you are treating the correct symptoms and not doubling up on medications/ingredients.    Medicines you can use  Nasal congestion  Little Remedies saline spray (aerosol/mist)- can try this, it is in the kids section - pseudoephedrine (Sudafed)- behind the counter, do not use if you have high blood pressure, medicine that have -D in them.  - phenylephrine (Sudafed  PE) -Dextormethorphan + chlorpheniramine (Coridcidin HBP)- okay if you have high blood pressure -Oxymetazoline (Afrin) nasal spray- LIMIT to 3 days -Saline nasal spray -Neti pot (used distilled or bottled water)  Ear pain/congestion  -pseudoephedrine (sudafed) - Nasonex/flonase nasal spray  Fever  -Acetaminophen (Tyelnol) -Ibuprofen (Advil, motrin, aleve)  Sore Throat  -Acetaminophen (Tyelnol) -Ibuprofen (Advil, motrin, aleve) -Drink a lot of water -Gargle with salt water - Rest your voice (don't talk) -Throat sprays -Cough drops  Body Aches  -Acetaminophen (Tyelnol) -Ibuprofen (Advil, motrin, aleve)  Headache  -Acetaminophen (Tyelnol) -Ibuprofen (Advil, motrin, aleve) - Exedrin, Exedrin Migraine  Allergy symptoms (cough, sneeze, runny nose, itchy eyes) -Claritin or loratadine cheapest but likely the weakest  -Zyrtec or certizine at night because it can make you sleepy -The strongest is allegra or fexafinadine  Cheapest at [employer], [employer], [employer]  Cough  -Dextromethorphan (Delsym)- medicine that has DM in it -Guafenesin (Mucinex/Robitussin) - cough drops - drink lots of water  Chest Congestion  -Guafenesin (Mucinex/Robitussin)  Red Itchy Eyes  - Naphcon-A  Upset Stomach  - Bland diet (nothing spicy, greasy, fried, and high acid foods like tomatoes, oranges, berries) -OKAY- cereal, bread, soup, crackers, rice -Eat smaller more frequent meals -reduce caffeine, no alcohol -Loperamide (Imodium-AD) if diarrhea -Prevacid for heart burn  General health when sick  -Hydration -wash your hands frequently -keep surfaces clean -change pillow cases and sheets often -  Get fresh air but do not exercise strenuously -Vitamin D, double up on it - Vitamin C -Zinc       When it comes to diets, agreement about the perfect plan isn't easy to find, even among the experts. Experts at the [employer] developed an idea known as the Healthy Eating  Plate. Just imagine a plate divided into logical, healthy portions.  The emphasis is on diet quality:  Load up on vegetables and fruits - one-half of your plate: Aim for color and variety, and remember that potatoes don't count.  Go for whole grains - one-quarter of your plate: Whole wheat, barley, wheat berries, quinoa, oats, brown rice, and foods made with them. If you want pasta, go with whole wheat pasta.  Protein power - one-quarter of your plate: Fish, chicken, beans, and nuts are all healthy, versatile protein sources. Limit red meat.  The diet, however, does go beyond the plate, offering a few other suggestions.  Use healthy plant oils, such as olive, canola, soy, corn, sunflower and peanut. Check the labels, and avoid partially hydrogenated oil, which have unhealthy trans fats.  If you're thirsty, drink water. Coffee and tea are good in moderation, but skip sugary drinks and limit milk and dairy products to one or two daily servings.  The type of carbohydrate in the diet is more important than the amount. Some sources of carbohydrates, such as vegetables, fruits, whole grains, and beans-are healthier than others.  Finally, stay active.

## 2018-11-06 LAB — CBC WITH DIFFERENTIAL/PLATELET
BASOS PCT: 0.6 %
Basophils Absolute: 53 cells/uL (ref 0–200)
EOS ABS: 202 {cells}/uL (ref 15–500)
Eosinophils Relative: 2.3 %
HCT: 36.9 % (ref 35.0–45.0)
Hemoglobin: 12.6 g/dL (ref 11.7–15.5)
Lymphs Abs: 2015 cells/uL (ref 850–3900)
MCH: 29.5 pg (ref 27.0–33.0)
MCHC: 34.1 g/dL (ref 32.0–36.0)
MCV: 86.4 fL (ref 80.0–100.0)
MPV: 10.9 fL (ref 7.5–12.5)
Monocytes Relative: 8.3 %
NEUTROS PCT: 65.9 %
Neutro Abs: 5799 cells/uL (ref 1500–7800)
PLATELETS: 254 10*3/uL (ref 140–400)
RBC: 4.27 10*6/uL (ref 3.80–5.10)
RDW: 14.3 % (ref 11.0–15.0)
TOTAL LYMPHOCYTE: 22.9 %
WBC: 8.8 10*3/uL (ref 3.8–10.8)
WBCMIX: 730 {cells}/uL (ref 200–950)

## 2018-11-06 LAB — LIPID PANEL
CHOL/HDL RATIO: 3.5 (calc) (ref <5.0)
Cholesterol: 169 mg/dL (ref <200)
HDL: 48 mg/dL — AB (ref >50)
LDL CHOLESTEROL (CALC): 92 mg/dL
NON-HDL CHOLESTEROL (CALC): 121 mg/dL (ref <130)
TRIGLYCERIDES: 193 mg/dL — AB (ref <150)

## 2018-11-06 LAB — COMPLETE METABOLIC PANEL WITH GFR
AG RATIO: 1.8 (calc) (ref 1.0–2.5)
ALBUMIN MSPROF: 4.2 g/dL (ref 3.6–5.1)
ALT: 21 U/L (ref 6–29)
AST: 24 U/L (ref 10–35)
Alkaline phosphatase (APISO): 113 U/L (ref 33–130)
BILIRUBIN TOTAL: 0.7 mg/dL (ref 0.2–1.2)
BUN/Creatinine Ratio: 22 (calc) (ref 6–22)
BUN: 35 mg/dL — ABNORMAL HIGH (ref 7–25)
CO2: 29 mmol/L (ref 20–32)
CREATININE: 1.62 mg/dL — AB (ref 0.60–0.93)
Calcium: 10 mg/dL (ref 8.6–10.4)
Chloride: 103 mmol/L (ref 98–110)
GFR, EST NON AFRICAN AMERICAN: 31 mL/min/{1.73_m2} — AB (ref >=60)
GFR, Est African American: 35 mL/min/{1.73_m2} — ABNORMAL LOW (ref >=60)
Globulin: 2.3 g/dL (calc) (ref 1.9–3.7)
Glucose, Bld: 103 mg/dL — ABNORMAL HIGH (ref 65–99)
Potassium: 3.9 mmol/L (ref 3.5–5.3)
Sodium: 141 mmol/L (ref 135–146)
TOTAL PROTEIN: 6.5 g/dL (ref 6.1–8.1)

## 2018-11-06 LAB — ANA: Anti Nuclear Antibody(ANA): NEGATIVE

## 2018-11-06 LAB — MAGNESIUM: Magnesium: 1.9 mg/dL (ref 1.5–2.5)

## 2018-11-06 LAB — ANTI-DNA ANTIBODY, DOUBLE-STRANDED

## 2018-11-06 LAB — TSH: TSH: 1.87 mIU/L (ref 0.40–4.50)

## 2018-11-06 LAB — HEMOGLOBIN A1C
EAG (MMOL/L): 7.1 (calc)
Hgb A1c MFr Bld: 6.1 % of total Hgb — ABNORMAL HIGH (ref <5.7)
MEAN PLASMA GLUCOSE: 128 (calc)

## 2018-11-06 LAB — RNP ANTIBODY: Ribonucleic Protein(ENA) Antibody, IgG: <1 AI

## 2018-11-06 LAB — RHEUMATOID FACTOR: Rhuematoid fact SerPl-aCnc: <14 IU/mL (ref <14)

## 2018-11-06 MED ORDER — FAMOTIDINE 40 MG PO TABS: 40.0000 mg | ORAL_TABLET | Freq: Every evening | ORAL | 1 refills | Status: DC

## 2018-11-06 NOTE — Addendum Note (Signed)
Addended by: Vicie Mutters R on: 11/06/2018 04:21 PM   Modules accepted: Orders

## 2018-12-09 DIAGNOSIS — E669 Obesity, unspecified: Secondary | ICD-10-CM | POA: Diagnosis not present

## 2018-12-09 DIAGNOSIS — E782 Mixed hyperlipidemia: Secondary | ICD-10-CM | POA: Diagnosis not present

## 2018-12-09 DIAGNOSIS — M10379 Gout due to renal impairment, unspecified ankle and foot: Secondary | ICD-10-CM | POA: Diagnosis not present

## 2018-12-09 DIAGNOSIS — N2581 Secondary hyperparathyroidism of renal origin: Secondary | ICD-10-CM | POA: Diagnosis not present

## 2018-12-09 DIAGNOSIS — N183 Chronic kidney disease, stage 3 (moderate): Secondary | ICD-10-CM | POA: Diagnosis not present

## 2018-12-09 DIAGNOSIS — I129 Hypertensive chronic kidney disease with stage 1 through stage 4 chronic kidney disease, or unspecified chronic kidney disease: Secondary | ICD-10-CM | POA: Diagnosis not present

## 2018-12-09 DIAGNOSIS — D631 Anemia in chronic kidney disease: Secondary | ICD-10-CM | POA: Diagnosis not present

## 2018-12-11 ENCOUNTER — Telehealth: Payer: Self-pay | Admitting: Physician Assistant

## 2018-12-11 DIAGNOSIS — M2550 Pain in unspecified joint: Secondary | ICD-10-CM

## 2018-12-11 NOTE — Telephone Encounter (Signed)
-----   Message from Elenor Quinones, [place] sent at 12/10/2018  3:12 PM EST ----- Regarding: ortho referral Contact: [phone] Pt reports-----------------Ready to have ortho referral started.

## 2018-12-15 ENCOUNTER — Other Ambulatory Visit: Payer: Self-pay | Admitting: Physician Assistant

## 2018-12-17 ENCOUNTER — Encounter (INDEPENDENT_AMBULATORY_CARE_PROVIDER_SITE_OTHER): Payer: Self-pay | Admitting: Family Medicine

## 2018-12-17 ENCOUNTER — Ambulatory Visit (INDEPENDENT_AMBULATORY_CARE_PROVIDER_SITE_OTHER): Payer: Medicare Other | Admitting: Family Medicine

## 2018-12-17 ENCOUNTER — Ambulatory Visit (INDEPENDENT_AMBULATORY_CARE_PROVIDER_SITE_OTHER): Payer: Self-pay

## 2018-12-17 DIAGNOSIS — M79642 Pain in left hand: Secondary | ICD-10-CM

## 2018-12-17 DIAGNOSIS — M79641 Pain in right hand: Secondary | ICD-10-CM | POA: Diagnosis not present

## 2018-12-17 MED ORDER — DICLOFENAC SODIUM 1 % TD GEL: 4.0000 g | Freq: Four times a day (QID) | TRANSDERMAL | 6 refills | Status: AC | PRN

## 2018-12-17 NOTE — Progress Notes (Signed)
Office Visit Note   Patient: Kelly Cantu           Date of Birth: 1941/11/08           MRN: [id number] Visit Date: 12/17/2018 Requested by: Vicie Mutters, PA-C [address] [place], [place] [address] PCP: Unk Pinto, MD  Subjective: Chief Complaint  Patient presents with  . Left Hand - Pain  . Right Hand - Pain    Mainly having achy pain in the thumb and little finger.  Right hand dominant.    HPI: She is a 78 year old right-hand-dominant female with bilateral hand pain.  Gradual onset of symptoms over the past 6 or 7 years.  She has noticed progressive aching and stiffness in some of her fingers.  It is difficult for her to make a fist, she has weakness with her grip.  Pain is not severe but it is a constant ache.  She probably has the most pain in her right fifth finger.  She has a history of carpal tunnel syndrome in the past, the symptoms are totally different.  Carpal tunnel resolved with the steroid injection.  She denies any numbness or tingling in her hand.  She does have a history of gout affecting her feet.  She has not had quite a while.  She is on allopurinol chronically and her most recent uric acid level was below 5.  She had rheumatoid labs drawn recently which were all normal.  She has a family history of rheumatoid arthritis as well as gout.  Patient denies any history of rash.  She is never been diagnosed with psoriasis.  She has chronic kidney disease and is not supposed to take anti-inflammatories.                ROS: She has prediabetes, hypertension, hyperlipidemia.  Other systems were reviewed and are negative.  Objective: Vital Signs: There were no vitals taken for this visit.  Physical Exam:  Hands: She has nodularity of the DIP and PIP joints of most of her fingers.  No synovitis at the MCP joints.  She has limited range of motion of the DIP and PIP joints.  Her right third and fifth fingers seem to be the most tender.  There is no  warmth or erythema in any of her fingers.  No swelling in her wrists.  Positive Tinel's at the carpal tunnel on the right but negative Phalen's test bilaterally.  No thenar atrophy.    Imaging: X-rays hands: Left hand has significant joint space narrowing and periarticular spurring at the index and third finger DIP and PIP joints with subchondral cystic change.  There is joint space narrowing of most of the other DIP and PIP joints.  MCP joints are normal in appearance.  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  Assessment & Plan: 1.  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 -She is limited as to what she can take due to kidney disease.  We will ask her nephrologist whether she can use Voltaren gel for flareups. -I think she would also benefit from trying glucosamine. -Offered referral to hand therapy but she states her pain is not bad enough right now.  Could consider that in the future. -If she has a severe flareup, could try cortisone injection.  I will see her back as needed.   Follow-Up Instructions: No follow-ups on file.      Procedures: No procedures  performed  No notes on file    PMFS History: Patient Active Problem List   Diagnosis Date Noted  . FHx: heart disease 07/31/2018  . Former smoker 07/31/2018  . Type 2 diabetes mellitus with stage 4 chronic kidney disease, without long-term current use of insulin ([place]) 07/31/2018  . Secondary hyperparathyroidism of renal origin ([place]) 04/10/2018  . Encounter for Medicare annual wellness exam 11/13/2015  . Obesity (BMI 30.0-34.9) 11/11/2015  . Idiopathic gout 04/11/2015  . Vitamin D deficiency 09/29/2014  . Medication management 09/29/2014  . CKD (chronic kidney disease) stage 4, GFR 15-29 ml/min ([facility]) 07/22/2014  . Hyperlipidemia, mixed   . Essential hypertension   . Prediabetes   . GERD (gastroesophageal reflux disease)   . DJD (degenerative joint disease)     . DDD (degenerative disc disease), lumbar   . IBS (irritable bowel syndrome)    Past Medical History:  Diagnosis Date  . Cataract   . Chronic kidney disease   . DDD (degenerative disc disease), lumbar   . DJD (degenerative joint disease)   . GERD (gastroesophageal reflux disease)   . Hyperlipidemia   . Hypertension   . IBS (irritable bowel syndrome)   . Prediabetes     Family History  Problem Relation Age of Onset  . Cancer Mother        liver  . Liver cancer Mother   . Heart disease Father   . Heart attack Father   . Hypertension Sister   . Cancer Sister        kidney  . Colon cancer Neg Hx   . Breast cancer Neg Hx     Past Surgical History:  Procedure Laterality Date  . ABDOMINAL HYSTERECTOMY    . CATARACT EXTRACTION    . COLONOSCOPY  x 2   w/Dr Carlean Purl   Social History   Occupational History  . Not on file  Tobacco Use  . Smoking status: Former Smoker    Last attempt to quit: 06/16/2004    Years since quitting: 14.5  . Smokeless tobacco: Never Used  Substance and Sexual Activity  . Alcohol use: No  . Drug use: No  . Sexual activity: Not on file

## 2018-12-17 NOTE — Patient Instructions (Signed)
    Glucosamine sulfate:  1,000 mg twice daily

## 2018-12-24 ENCOUNTER — Ambulatory Visit (INDEPENDENT_AMBULATORY_CARE_PROVIDER_SITE_OTHER): Payer: Medicare Other | Admitting: Adult Health

## 2018-12-24 ENCOUNTER — Encounter: Payer: Self-pay | Admitting: Adult Health

## 2018-12-24 VITALS — BP 128/64 | HR 70 | Temp 98.1°F | Ht 63.0 in | Wt 178.0 lb

## 2018-12-24 DIAGNOSIS — R6889 Other general symptoms and signs: Secondary | ICD-10-CM

## 2018-12-24 LAB — POC INFLUENZA A&B (BINAX/QUICKVUE)
Influenza A, POC: NEGATIVE
Influenza B, POC: NEGATIVE

## 2018-12-24 MED ORDER — PROMETHAZINE-DM 6.25-15 MG/5ML PO SYRP: 5.0000 mL | ORAL_SOLUTION | Freq: Four times a day (QID) | ORAL | 1 refills | Status: DC | PRN

## 2018-12-24 MED ORDER — PREDNISONE 20 MG PO TABS: ORAL_TABLET | ORAL | 0 refills | Status: DC

## 2018-12-24 MED ORDER — AZITHROMYCIN 250 MG PO TABS: ORAL_TABLET | ORAL | 1 refills | Status: AC

## 2018-12-24 NOTE — Progress Notes (Signed)
Assessment and Plan:  Jency was seen today for acute visit.  Diagnoses and all orders for this visit:  Flu-like symptoms Flu swab was negative - viral URI Suggested symptomatic OTC remedies, immune support Push fluids - likely dehydrated - alternate water and electrolyte supplemented drink if not eating Tylenol for fever/headaches Present to ER if fever uncontrolled above 101 despite medication, dyspnea, CP, new or worsening symptoms Stay out for work until 24-48 hours after fever resolves - expect 5-7 day duration Follow up as needed Hold zpak and initiate for increasingly productive cough -     POC Influenza A&B(BINAX/QUICKVUE) -     predniSONE (DELTASONE) 20 MG tablet; 2 tablets daily for 3 days, 1 tablet daily for 4 days. -     promethazine-dextromethorphan (PROMETHAZINE-DM) 6.25-15 MG/5ML syrup; Take 5 mLs by mouth 4 (four) times daily as needed for cough. -     azithromycin (ZITHROMAX) 250 MG tablet; Take 2 tablets (500 mg) on  Day 1,  followed by 1 tablet (250 mg) once daily on Days 2 through 5.  Further disposition pending results of labs. Discussed med's effects and SE's.   Over 15 minutes of exam, counseling, chart review, and critical decision making was performed.   Future Appointments  Date Time Provider [place]  02/11/2019  3:00 PM Unk Pinto, MD [facility] None  04/22/2019  3:45 PM Liane Comber, NP [facility] None    ------------------------------------------------------------------------------------------------------------------   HPI BP 128/64   Pulse 70   Temp 98.1 F (36.7 C)   Ht 5\' 3"  (1.6 m)   Wt 178 lb (80.7 kg)   SpO2 96%   BMI 31.53 kg/m   78 y.o.female with htn and T2DM presents for 3 days of URI symptoms; she reports headache, coughing, mostly dry, occasionally very mildly productive at home. She reports having chills, hasn't checked temp. She also endorses body aches. Symptoms are the worse today, progressive. Denies shortness of  breath, dizziness, N/V/D, vision changes. Denies nasal congestion, sinus pressure, sore throat. She has 2 family members who she saw recently who were flu + earlier this week.   She has been taking tylenol for headache and somewhat helpful.   She did have flu vaccine this year.   Past Medical History:  Diagnosis Date  . Cataract   . Chronic kidney disease   . DDD (degenerative disc disease), lumbar   . DJD (degenerative joint disease)   . GERD (gastroesophageal reflux disease)   . Hyperlipidemia   . Hypertension   . IBS (irritable bowel syndrome)   . Prediabetes      Allergies  Allergen Reactions  . Ace Inhibitors     Pt unsure of reaction  . Feldene [Piroxicam] Rash    Current Outpatient Medications on File Prior to Visit  Medication Sig  . allopurinol (ZYLOPRIM) 300 MG tablet TAKE 1 TABLET BY MOUTH EVERY DAY  . atenolol (TENORMIN) 50 MG tablet TAKE 1 TABLET DAILY FOR BLOOD PRESSURE  . atorvastatin (LIPITOR) 20 MG tablet TAKE 1 TABLET DAILY AS DIRECTED FOR CHOLESTEROL  . BABY ASPIRIN PO Take 81 mg by mouth daily.  . calcitRIOL (ROCALTROL) 0.25 MCG capsule Take 0.25 mcg by mouth daily.  . diclofenac sodium (VOLTAREN) 1 % GEL Apply 4 g topically 4 (four) times daily as needed.  . famotidine (PEPCID) 40 MG tablet Take 1 tablet (40 mg total) by mouth every evening.  . furosemide (LASIX) 40 MG tablet TAKE 1 TABLET DAILY FOR BLOOD PRESSURE AND FLUID  . Multiple  Vitamins-Minerals (ICAPS AREDS 2 PO) Take 1 capsule by mouth daily.  . Multiple Vitamins-Minerals (MULTIVITAMIN PO) Take by mouth daily.  . Omega-3 Fatty Acids (FISH OIL PO) Take by mouth daily.   No current facility-administered medications on file prior to visit.     ROS: all negative except above.   Physical Exam:  BP 128/64   Pulse 70   Temp 98.1 F (36.7 C)   Ht 5\' 3"  (1.6 m)   Wt 178 lb (80.7 kg)   SpO2 96%   BMI 31.53 kg/m   General Appearance: Well nourished, appears to be feeling unwell, in no acute  distress. Eyes: PERRLA, EOMs, conjunctiva no swelling or erythema Sinuses: No Frontal/maxillary tenderness ENT/Mouth: Ext aud canals clear, TMs without erythema, bulging. No erythema, swelling, or exudate on post pharynx.  Tonsils not swollen or erythematous. Hearing normal.  Neck: Supple, thyroid normal.  Respiratory: Respiratory effort normal, BS equal bilaterally without rales, rhonchi, wheezing or stridor.  Cardio: RRR with no MRGs. Brisk peripheral pulses without edema.  Abdomen: Soft, + BS.  Non tender Lymphatics: Non tender without lymphadenopathy.  Musculoskeletal: normal gait.  Skin: Warm, dry without rashes, lesions, ecchymosis.  Neuro: Cranial nerves intact. Normal muscle tone, gait normal  Psych: Awake and oriented X 3, normal affect, Insight and Judgment appropriate.     Izora Ribas, NP 11:31 AM [facility]

## 2018-12-24 NOTE — Patient Instructions (Signed)
Go to the ER if any chest pain, shortness of breath, nausea, dizziness, severe HA, changes vision/speech  Go to ER if you develop a fever that is 101+ despite tylenol/aleve/ibuprofen   Influenza, Adult Influenza, more commonly known as "the flu," is a viral infection that mainly affects the respiratory tract. The respiratory tract includes organs that help you breathe, such as the lungs, nose, and throat. The flu causes many symptoms similar to the common cold along with high fever and body aches. The flu spreads easily from person to person (is contagious). Getting a flu shot (influenza vaccination) every year is the best way to prevent the flu. What are the causes? This condition is caused by the influenza virus. You can get the virus by:  Breathing in droplets that are in the air from an infected person's cough or sneeze.  Touching something that has been exposed to the virus (has been contaminated) and then touching your mouth, nose, or eyes. What increases the risk? The following factors may make you more likely to get the flu:  Not washing or sanitizing your hands often.  Having close contact with many people during cold and flu season.  Touching your mouth, eyes, or nose without first washing or sanitizing your hands.  Not getting a yearly (annual) flu shot. You may have a higher risk for the flu, including serious problems such as a lung infection (pneumonia), if you:  Are older than 65.  Are pregnant.  Have a weakened disease-fighting system (immune system). You may have a weakened immune system if you: ? Have HIV or AIDS. ? Are undergoing chemotherapy. ? Are taking medicines that reduce (suppress) the activity of your immune system.  Have a long-term (chronic) illness, such as heart disease, kidney disease, diabetes, or lung disease.  Have a liver disorder.  Are severely overweight (morbidly obese).  Have anemia. This is a condition that affects your red blood  cells.  Have asthma. What are the signs or symptoms? Symptoms of this condition usually begin suddenly and last 4-14 days. They may include:  Fever and chills.  Headaches, body aches, or muscle aches.  Sore throat.  Cough.  Runny or stuffy (congested) nose.  Chest discomfort.  Poor appetite.  Weakness or fatigue.  Dizziness.  Nausea or vomiting. How is this diagnosed? This condition may be diagnosed based on:  Your symptoms and medical history.  A physical exam.  Swabbing your nose or throat and testing the fluid for the influenza virus. How is this treated? If the flu is diagnosed early, you can be treated with medicine that can help reduce how severe the illness is and how long it lasts (antiviral medicine). This may be given by mouth (orally) or through an IV. Taking care of yourself at home can help relieve symptoms. Your health care provider may recommend:  Taking over-the-counter medicines.  Drinking plenty of fluids. In many cases, the flu goes away on its own. If you have severe symptoms or complications, you may be treated in a hospital. Follow these instructions at home: Activity  Rest as needed and get plenty of sleep.  Stay home from work or school as told by your health care provider. Unless you are visiting your health care provider, avoid leaving home until your fever has been gone for 24 hours without taking medicine. Eating and drinking  Take an oral rehydration solution (ORS). This is a drink that is sold at pharmacies and retail stores.  Drink enough fluid to keep  your urine pale yellow.  Drink clear fluids in small amounts as you are able. Clear fluids include water, ice chips, diluted fruit juice, and low-calorie sports drinks.  Eat bland, easy-to-digest foods in small amounts as you are able. These foods include bananas, applesauce, rice, lean meats, toast, and crackers.  Avoid drinking fluids that contain a lot of sugar or caffeine, such  as energy drinks, regular sports drinks, and soda.  Avoid alcohol.  Avoid spicy or fatty foods. General instructions      Take over-the-counter and prescription medicines only as told by your health care provider.  Use a cool mist humidifier to add humidity to the air in your home. This can make it easier to breathe.  Cover your mouth and nose when you cough or sneeze.  Wash your hands with soap and water often, especially after you cough or sneeze. If soap and water are not available, use alcohol-based hand sanitizer.  Keep all follow-up visits as told by your health care provider. This is important. How is this prevented?   Get an annual flu shot. You may get the flu shot in late summer, fall, or winter. Ask your health care provider when you should get your flu shot.  Avoid contact with people who are sick during cold and flu season. This is generally fall and winter. Contact a health care provider if:  You develop new symptoms.  You have: ? Chest pain. ? Diarrhea. ? A fever.  Your cough gets worse.  You produce more mucus.  You feel nauseous or you vomit. Get help right away if:  You develop shortness of breath or difficulty breathing.  Your skin or nails turn a bluish color.  You have severe pain or stiffness in your neck.  You develop a sudden headache or sudden pain in your face or ear.  You cannot eat or drink without vomiting. Summary  Influenza, more commonly known as "the flu," is a viral infection that primarily affects your respiratory tract.  Symptoms of the flu usually begin suddenly and last 4-14 days.  Getting an annual flu shot is the best way to prevent getting the flu.  Stay home from work or school as told by your health care provider. Unless you are visiting your health care provider, avoid leaving home until your fever has been gone for 24 hours without taking medicine.  Keep all follow-up visits as told by your health care provider.  This is important. This information is not intended to replace advice given to you by your health care provider. Make sure you discuss any questions you have with your health care provider. Document Released: 11/16/2000 Document Revised: 05/07/2018 Document Reviewed: 05/07/2018 Elsevier Interactive Patient Education  2019 [employer].

## 2019-01-02 ENCOUNTER — Other Ambulatory Visit: Payer: Self-pay | Admitting: Internal Medicine

## 2019-01-07 ENCOUNTER — Other Ambulatory Visit: Payer: Self-pay | Admitting: Physician Assistant

## 2019-01-07 ENCOUNTER — Other Ambulatory Visit: Payer: Self-pay

## 2019-01-07 MED ORDER — FAMOTIDINE 40 MG PO TABS: 40.0000 mg | ORAL_TABLET | Freq: Every evening | ORAL | 1 refills | Status: DC

## 2019-02-11 ENCOUNTER — Other Ambulatory Visit: Payer: Self-pay

## 2019-02-11 ENCOUNTER — Ambulatory Visit (INDEPENDENT_AMBULATORY_CARE_PROVIDER_SITE_OTHER): Payer: Medicare Other | Admitting: Internal Medicine

## 2019-02-11 ENCOUNTER — Encounter: Payer: Self-pay | Admitting: Internal Medicine

## 2019-02-11 VITALS — BP 140/76 | HR 64 | Temp 97.7°F | Resp 16 | Ht 63.0 in | Wt 175.6 lb

## 2019-02-11 DIAGNOSIS — E559 Vitamin D deficiency, unspecified: Secondary | ICD-10-CM | POA: Diagnosis not present

## 2019-02-11 DIAGNOSIS — K219 Gastro-esophageal reflux disease without esophagitis: Secondary | ICD-10-CM

## 2019-02-11 DIAGNOSIS — N2581 Secondary hyperparathyroidism of renal origin: Secondary | ICD-10-CM | POA: Diagnosis not present

## 2019-02-11 DIAGNOSIS — E1122 Type 2 diabetes mellitus with diabetic chronic kidney disease: Secondary | ICD-10-CM | POA: Diagnosis not present

## 2019-02-11 DIAGNOSIS — Z136 Encounter for screening for cardiovascular disorders: Secondary | ICD-10-CM

## 2019-02-11 DIAGNOSIS — I10 Essential (primary) hypertension: Secondary | ICD-10-CM

## 2019-02-11 DIAGNOSIS — Z1212 Encounter for screening for malignant neoplasm of rectum: Secondary | ICD-10-CM

## 2019-02-11 DIAGNOSIS — M1000 Idiopathic gout, unspecified site: Secondary | ICD-10-CM

## 2019-02-11 DIAGNOSIS — N184 Chronic kidney disease, stage 4 (severe): Secondary | ICD-10-CM | POA: Diagnosis not present

## 2019-02-11 DIAGNOSIS — Z87891 Personal history of nicotine dependence: Secondary | ICD-10-CM

## 2019-02-11 DIAGNOSIS — Z8249 Family history of ischemic heart disease and other diseases of the circulatory system: Secondary | ICD-10-CM

## 2019-02-11 DIAGNOSIS — Z79899 Other long term (current) drug therapy: Secondary | ICD-10-CM

## 2019-02-11 DIAGNOSIS — E782 Mixed hyperlipidemia: Secondary | ICD-10-CM | POA: Diagnosis not present

## 2019-02-11 DIAGNOSIS — Z1211 Encounter for screening for malignant neoplasm of colon: Secondary | ICD-10-CM

## 2019-02-11 NOTE — Patient Instructions (Signed)

## 2019-02-11 NOTE — Progress Notes (Signed)
[facility] Unk Pinto, M.D.     [name]. [name], P.A.-C Liane Comber, [place] [address] [place], [place] [address] Telephone [phone] Telefax [phone]  Comprehensive Evaluation &  Examination     This very nice 78 y.o. [facility] presents for a comprehensive evaluation and management of multiple medical co-morbidities.  Patient has been followed for HTN, HLD, T2_DM  and Vitamin D Deficiency. Patient's GERD is controlled with prudent diet & her meds and she also has Gout also in control on meds.      HTN predates circa 1991. Patient's BP has been controlled at home and patient denies any cardiac symptoms as chest pain, palpitations, shortness of breath, dizziness or ankle swelling. Today's BP is at goal - 140/76.      Patient's hyperlipidemia is controlled with diet and medications. Patient denies myalgias or other medication SE's. Last lipids were near goal with elevated Trig's: Lab Results  Component Value Date   CHOL 177 02/11/2019   HDL 46 (L) 02/11/2019   LDLCALC 100 (H) 02/11/2019   TRIG 192 (H) 02/11/2019   CHOLHDL 3.8 02/11/2019      Patient has hx/o  PreDiabetes (A1c 6.0% / 2014)  and then T2_NIDDM (A1c 6.6% / 2017)  and patient denies reactive hypoglycemic symptoms, visual blurring, diabetic polys or paresthesias. She has consequent CKD4 (GFR 31 / Dec 2019) attributed to her DM & HTN.  Last A1c was not at goal: Lab Results  Component Value Date   HGBA1C 6.1 (H) 02/11/2019      Finally, patient has history of Vitamin D Deficiency  ("34" / 2008)  and last Vitamin D was still very low: Lab Results  Component Value Date   VD25OH 37 02/11/2019   Current Outpatient Medications on File Prior to Visit  Medication Sig  . allopurinol (ZYLOPRIM) 300 MG tablet TAKE 1 TABLET BY MOUTH EVERY DAY  . atenolol (TENORMIN) 50 MG tablet TAKE 1 TABLET DAILY FOR BLOOD PRESSURE  . atorvastatin (LIPITOR)  20 MG tablet TAKE 1 TABLET DAILY AS DIRECTED FOR CHOLESTEROL  . BABY ASPIRIN PO Take 81 mg by mouth daily.  . calcitRIOL (ROCALTROL) 0.25 MCG capsule Take 0.25 mcg by mouth daily.  . diclofenac sodium (VOLTAREN) 1 % GEL Apply 4 g topically 4 (four) times daily as needed.  . famotidine (PEPCID) 40 MG tablet TAKE 1 TABLET(40 MG) BY MOUTH EVERY EVENING  . furosemide (LASIX) 40 MG tablet TAKE 1 TABLET DAILY FOR BLOOD PRESSURE AND FLUID  . Multiple Vitamins-Minerals (ICAPS AREDS 2 PO) Take 1 capsule by mouth daily.  . Multiple Vitamins-Minerals (MULTIVITAMIN PO) Take by mouth daily.  . Omega-3 Fatty Acids (FISH OIL PO) Take by mouth daily.   No current facility-administered medications on file prior to visit.    Allergies  Allergen Reactions  . Ace Inhibitors     Pt unsure of reaction  . Feldene [Piroxicam] Rash   Past Medical History:  Diagnosis Date  . Cataract   . Chronic kidney disease   . DDD (degenerative disc disease), lumbar   . DJD (degenerative joint disease)   . GERD (gastroesophageal reflux disease)   . Hyperlipidemia   . Hypertension   . IBS (irritable bowel syndrome)   . Prediabetes    Health Maintenance  Topic Date Due  . OPHTHALMOLOGY EXAM  09/25/2017  . FOOT EXAM  01/06/2019  . HEMOGLOBIN A1C  08/14/2019  . URINE MICROALBUMIN  02/11/2020  . TETANUS/TDAP  09/19/2025  . INFLUENZA VACCINE  Completed  . DEXA SCAN  Completed  . PNA vac Low Risk Adult  Completed   Immunization History  Administered Date(s) Administered  . DT 09/29/2014, 09/20/2015  . Influenza Split 09/28/2013  . Influenza, High Dose Seasonal PF 10/13/2014, 09/03/2016, 10/01/2017, 11/03/2018  . Influenza-Unspecified 08/04/2015  . Pneumococcal Conjugate-13 10/13/2014  . Pneumococcal Polysaccharide-23 09/23/2012  . Td 09/02/2004  . Zoster 10/13/2013   Last Colon - 08/30/2014 - Dr Carlean Purl recc no repeat due to age   Last MGM - 01/30/2018 & plans top schedule soon Past Surgical History:   Procedure Laterality Date  . ABDOMINAL HYSTERECTOMY    . CATARACT EXTRACTION    . COLONOSCOPY  x 2   w/Dr Carlean Purl   Family History  Problem Relation Age of Onset  . Cancer Mother        liver  . Liver cancer Mother   . Heart disease Father   . Heart attack Father   . Hypertension Sister   . Cancer Sister        kidney  . Colon cancer Neg Hx   . Breast cancer Neg Hx    Social History   Tobacco Use  . Smoking status: Former Smoker    Last attempt to quit: 06/16/2004    Years since quitting: 14.6  . Smokeless tobacco: Never Used  Substance Use Topics  . Alcohol use: No  . Drug use: No    ROS Constitutional: Denies fever, chills, weight loss/gain, headaches, insomnia,  night sweats, and change in appetite. Does c/o fatigue. Eyes: Denies redness, blurred vision, diplopia, discharge, itchy, watery eyes.  ENT: Denies discharge, congestion, post nasal drip, epistaxis, sore throat, earache, hearing loss, dental pain, Tinnitus, Vertigo, Sinus pain, snoring.  Cardio: Denies chest pain, palpitations, irregular heartbeat, syncope, dyspnea, diaphoresis, orthopnea, PND, claudication, edema Respiratory: denies cough, dyspnea, DOE, pleurisy, hoarseness, laryngitis, wheezing.  Gastrointestinal: Denies dysphagia, heartburn, reflux, water brash, pain, cramps, nausea, vomiting, bloating, diarrhea, constipation, hematemesis, melena, hematochezia, jaundice, hemorrhoids Genitourinary: Denies dysuria, frequency, urgency, nocturia, hesitancy, discharge, hematuria, flank pain Breast: Breast lumps, nipple discharge, bleeding.  Musculoskeletal: Denies arthralgia, myalgia, stiffness, Jt. Swelling, pain, limp, and strain/sprain. Denies falls. Skin: Denies puritis, rash, hives, warts, acne, eczema, changing in skin lesion Neuro: No weakness, tremor, incoordination, spasms, paresthesia, pain Psychiatric: Denies confusion, memory loss, sensory loss. Denies Depression. Endocrine: Denies change in weight,  skin, hair change, nocturia, and paresthesia, diabetic polys, visual blurring, hyper / hypo glycemic episodes.  Heme/Lymph: No excessive bleeding, bruising, enlarged lymph nodes.  Physical Exam  BP 140/76   Pulse 64   Temp 97.7 F (36.5 C)   Resp 16   Ht 5\' 3"  (1.6 m)   Wt 175 lb 9.6 oz (79.7 kg)   BMI 31.11 kg/m   General Appearance: Well nourished, well groomed and in no apparent distress.  Eyes: PERRLA, EOMs, conjunctiva no swelling or erythema, normal fundi and vessels. Sinuses: No frontal/maxillary tenderness ENT/Mouth: EACs patent / TMs  nl. Nares clear without erythema, swelling, mucoid exudates. Oral hygiene is good. No erythema, swelling, or exudate. Tongue normal, non-obstructing. Tonsils not swollen or erythematous. Hearing normal.  Neck: Supple, thyroid not palpable. No bruits, nodes or JVD. Respiratory: Respiratory effort normal.  BS equal and clear bilateral without rales, rhonci, wheezing or stridor. Cardio: Heart sounds are normal with regular rate and rhythm and no murmurs, rubs or gallops. Peripheral pulses are normal and equal bilaterally without edema. No aortic or femoral bruits. Chest: symmetric  with normal excursions and percussion. Breasts: Symmetric, without lumps, nipple discharge, retractions, or fibrocystic changes.  Abdomen: Flat, soft with bowel sounds active. Nontender, no guarding, rebound, hernias, masses, or organomegaly.  Lymphatics: Non tender without lymphadenopathy.  Genitourinary:  Musculoskeletal: Full ROM all peripheral extremities, joint stability, 5/5 strength, and normal gait. Skin: Warm and dry without rashes, lesions, cyanosis, clubbing or  ecchymosis.  Neuro: Cranial nerves intact, reflexes equal bilaterally. Normal muscle tone, no cerebellar symptoms. Sensation intact.  Pysch: Alert and oriented X 3, normal affect, Insight and Judgment appropriate.   Assessment and Plan  1. Essential hypertension  - EKG 12-Lead - Urinalysis,  Routine w reflex microscopic - Microalbumin / creatinine urine ratio - CBC with Differential/Platelet - COMPLETE METABOLIC PANEL WITH GFR - Magnesium - TSH  2. Hyperlipidemia, mixed  - EKG 12-Lead - Lipid panel - TSH  3. Type 2 diabetes mellitus with stage 4 chronic kidney disease, without long-term current use of insulin (HCC)  - EKG 12-Lead - Urinalysis, Routine w reflex microscopic - Microalbumin / creatinine urine ratio - Hemoglobin A1c - Insulin, random  4. Vitamin D deficiency  - VITAMIN D 25 Hydroxyl  5. CKD (chronic kidney disease) stage 4, GFR 15-29 ml/min (HCC)  - Urinalysis, Routine w reflex microscopic - Microalbumin / creatinine urine ratio - PTH, intact and calcium  6. Secondary hyperparathyroidism of renal origin (HCC)  - PTH, intact and calcium  7. Idiopathic gout  - Uric acid  8. Gastroesophageal reflux disease  - CBC with Differential/Platelet  9. Screening for colorectal cancer  - POC Hemoccult Bld/Stl  10. Screening for ischemic heart disease  - EKG 12-Lead  11. FHx: heart disease  - EKG 12-Lead  12. Former smoker  - EKG 12-Lead  13. Medication management  - Urinalysis, Routine w reflex microscopic - Microalbumin / creatinine urine ratio - Uric acid - CBC with Differential/Platelet - COMPLETE METABOLIC PANEL WITH GFR - Magnesium - Lipid panel - TSH - Hemoglobin A1c - Insulin, random - VITAMIN D 25 Hydroxyl        Patient was counseled in prudent diet to achieve/maintain BMI less than 25 for weight control, BP monitoring, regular exercise and medications. Discussed med's effects and SE's. Screening labs and tests as requested with regular follow-up as recommended. Over 40 minutes of exam, counseling, chart review and high complex critical decision making was performed.

## 2019-02-12 ENCOUNTER — Other Ambulatory Visit: Payer: Self-pay | Admitting: Internal Medicine

## 2019-02-12 DIAGNOSIS — Z1231 Encounter for screening mammogram for malignant neoplasm of breast: Secondary | ICD-10-CM

## 2019-02-12 LAB — LIPID PANEL
Cholesterol: 177 mg/dL (ref <200)
HDL: 46 mg/dL — ABNORMAL LOW (ref >=50)
LDL Cholesterol (Calc): 100 mg/dL (calc) — ABNORMAL HIGH
Non-HDL Cholesterol (Calc): 131 mg/dL (calc) — ABNORMAL HIGH (ref <130)
TRIGLYCERIDES: 192 mg/dL — AB (ref <150)
Total CHOL/HDL Ratio: 3.8 (calc) (ref <5.0)

## 2019-02-12 LAB — TSH: TSH: 1.35 mIU/L (ref 0.40–4.50)

## 2019-02-12 LAB — CBC WITH DIFFERENTIAL/PLATELET
Absolute Monocytes: 600 cells/uL (ref 200–950)
Basophils Absolute: 40 cells/uL (ref 0–200)
Basophils Relative: 0.5 %
Eosinophils Absolute: 174 cells/uL (ref 15–500)
Eosinophils Relative: 2.2 %
HCT: 36 % (ref 35.0–45.0)
Hemoglobin: 12.2 g/dL (ref 11.7–15.5)
Lymphs Abs: 1951 cells/uL (ref 850–3900)
MCH: 29.6 pg (ref 27.0–33.0)
MCHC: 33.9 g/dL (ref 32.0–36.0)
MCV: 87.4 fL (ref 80.0–100.0)
MONOS PCT: 7.6 %
MPV: 11 fL (ref 7.5–12.5)
Neutro Abs: 5135 cells/uL (ref 1500–7800)
Neutrophils Relative %: 65 %
Platelets: 234 10*3/uL (ref 140–400)
RBC: 4.12 10*6/uL (ref 3.80–5.10)
RDW: 14.4 % (ref 11.0–15.0)
Total Lymphocyte: 24.7 %
WBC: 7.9 10*3/uL (ref 3.8–10.8)

## 2019-02-12 LAB — COMPLETE METABOLIC PANEL WITH GFR
AG Ratio: 2 (calc) (ref 1.0–2.5)
ALBUMIN MSPROF: 4.2 g/dL (ref 3.6–5.1)
ALT: 15 U/L (ref 6–29)
AST: 22 U/L (ref 10–35)
Alkaline phosphatase (APISO): 84 U/L (ref 37–153)
BUN/Creatinine Ratio: 25 (calc) — ABNORMAL HIGH (ref 6–22)
BUN: 47 mg/dL — ABNORMAL HIGH (ref 7–25)
CO2: 24 mmol/L (ref 20–32)
CREATININE: 1.86 mg/dL — AB (ref 0.60–0.93)
Calcium: 10.2 mg/dL (ref 8.6–10.4)
Chloride: 104 mmol/L (ref 98–110)
GFR, EST NON AFRICAN AMERICAN: 26 mL/min/{1.73_m2} — AB (ref >=60)
GFR, Est African American: 30 mL/min/{1.73_m2} — ABNORMAL LOW (ref >=60)
Globulin: 2.1 g/dL (calc) (ref 1.9–3.7)
Glucose, Bld: 99 mg/dL (ref 65–99)
Potassium: 4.2 mmol/L (ref 3.5–5.3)
Sodium: 139 mmol/L (ref 135–146)
Total Bilirubin: 0.8 mg/dL (ref 0.2–1.2)
Total Protein: 6.3 g/dL (ref 6.1–8.1)

## 2019-02-12 LAB — MICROALBUMIN / CREATININE URINE RATIO
Creatinine, Urine: 91 mg/dL (ref 20–275)
Microalb Creat Ratio: 11 mcg/mg creat (ref <30)
Microalb, Ur: 1 mg/dL

## 2019-02-12 LAB — URIC ACID: Uric Acid, Serum: 4.1 mg/dL (ref 2.5–7.0)

## 2019-02-12 LAB — HEMOGLOBIN A1C
Hgb A1c MFr Bld: 6.1 % of total Hgb — ABNORMAL HIGH (ref <5.7)
Mean Plasma Glucose: 128 (calc)
eAG (mmol/L): 7.1 (calc)

## 2019-02-12 LAB — PTH, INTACT AND CALCIUM
Calcium: 10.2 mg/dL (ref 8.6–10.4)
PTH: 15 pg/mL (ref 14–64)

## 2019-02-12 LAB — MAGNESIUM: Magnesium: 2.3 mg/dL (ref 1.5–2.5)

## 2019-02-12 LAB — URINALYSIS, ROUTINE W REFLEX MICROSCOPIC
Bilirubin Urine: NEGATIVE
GLUCOSE, UA: NEGATIVE
Hgb urine dipstick: NEGATIVE
Ketones, ur: NEGATIVE
Leukocytes,Ua: NEGATIVE
Nitrite: NEGATIVE
PROTEIN: NEGATIVE
Specific Gravity, Urine: 1.015 (ref 1.001–1.03)
pH: <=5 (ref 5.0–8.0)

## 2019-02-12 LAB — INSULIN, RANDOM: Insulin: 10 u[IU]/mL

## 2019-02-12 LAB — VITAMIN D 25 HYDROXY (VIT D DEFICIENCY, FRACTURES): Vit D, 25-Hydroxy: 37 ng/mL (ref 30–100)

## 2019-02-13 ENCOUNTER — Encounter: Payer: Self-pay | Admitting: *Deleted

## 2019-02-14 ENCOUNTER — Encounter: Payer: Self-pay | Admitting: Internal Medicine

## 2019-03-02 ENCOUNTER — Other Ambulatory Visit: Payer: Self-pay | Admitting: *Deleted

## 2019-03-02 DIAGNOSIS — Z1211 Encounter for screening for malignant neoplasm of colon: Secondary | ICD-10-CM

## 2019-03-02 DIAGNOSIS — Z1212 Encounter for screening for malignant neoplasm of rectum: Principal | ICD-10-CM

## 2019-03-02 LAB — POC HEMOCCULT BLD/STL (HOME/3-CARD/SCREEN)
Card #2 Fecal Occult Blod, POC: NEGATIVE
Card #3 Fecal Occult Blood, POC: NEGATIVE
Fecal Occult Blood, POC: NEGATIVE

## 2019-03-04 ENCOUNTER — Other Ambulatory Visit: Payer: Self-pay | Admitting: Internal Medicine

## 2019-03-10 ENCOUNTER — Ambulatory Visit: Payer: Medicare Other

## 2019-04-07 DIAGNOSIS — D631 Anemia in chronic kidney disease: Secondary | ICD-10-CM | POA: Diagnosis not present

## 2019-04-07 DIAGNOSIS — N189 Chronic kidney disease, unspecified: Secondary | ICD-10-CM | POA: Diagnosis not present

## 2019-04-07 DIAGNOSIS — I129 Hypertensive chronic kidney disease with stage 1 through stage 4 chronic kidney disease, or unspecified chronic kidney disease: Secondary | ICD-10-CM | POA: Diagnosis not present

## 2019-04-07 DIAGNOSIS — N2581 Secondary hyperparathyroidism of renal origin: Secondary | ICD-10-CM | POA: Diagnosis not present

## 2019-04-07 DIAGNOSIS — N183 Chronic kidney disease, stage 3 (moderate): Secondary | ICD-10-CM | POA: Diagnosis not present

## 2019-04-07 DIAGNOSIS — K219 Gastro-esophageal reflux disease without esophagitis: Secondary | ICD-10-CM | POA: Diagnosis not present

## 2019-04-07 DIAGNOSIS — E669 Obesity, unspecified: Secondary | ICD-10-CM | POA: Diagnosis not present

## 2019-04-07 DIAGNOSIS — Z0000 Encounter for general adult medical examination without abnormal findings: Secondary | ICD-10-CM | POA: Diagnosis not present

## 2019-04-07 DIAGNOSIS — E782 Mixed hyperlipidemia: Secondary | ICD-10-CM | POA: Diagnosis not present

## 2019-04-07 DIAGNOSIS — M109 Gout, unspecified: Secondary | ICD-10-CM | POA: Diagnosis not present

## 2019-04-07 DIAGNOSIS — M1990 Unspecified osteoarthritis, unspecified site: Secondary | ICD-10-CM | POA: Diagnosis not present

## 2019-04-22 ENCOUNTER — Ambulatory Visit
Admission: RE | Admit: 2019-04-22 | Discharge: 2019-04-22 | Disposition: A | Discharge status: Home / Self Care | Payer: Medicare Other | Source: Ambulatory Visit | Attending: Internal Medicine | Admitting: Internal Medicine

## 2019-04-22 ENCOUNTER — Other Ambulatory Visit: Payer: Self-pay

## 2019-04-22 ENCOUNTER — Ambulatory Visit: Payer: Self-pay | Admitting: Adult Health

## 2019-04-22 DIAGNOSIS — Z1231 Encounter for screening mammogram for malignant neoplasm of breast: Secondary | ICD-10-CM | POA: Diagnosis not present

## 2019-04-28 DIAGNOSIS — H353111 Nonexudative age-related macular degeneration, right eye, early dry stage: Secondary | ICD-10-CM | POA: Diagnosis not present

## 2019-05-20 NOTE — Progress Notes (Signed)
MEDICARE ANNUAL WELLNESS VISIT AND FOLLOW UP  Assessment:   Diagnoses and all orders for this visit:  Encounter for Medicare annual wellness exam  Essential hypertension Continue medication Monitor blood pressure at home; call if consistently over 130/80 Continue DASH diet.   Reminder to go to the ER if any CP, SOB, nausea, dizziness, severe HA, changes vision/speech, left arm numbness and tingling and jaw pain.  Irritable bowel syndrome, unspecified type Symptoms stable with lifestyle modification  Gastroesophageal reflux disease, esophagitis presence not specified Well managed on current medications Discussed diet, avoiding triggers and other lifestyle changes  Primary osteoarthritis involving multiple joints Mild, improved after cortisone shots, uses tylenol PRN Continue follow up with ortho as needed  CKD (chronic kidney disease) stage 4, GFR 15-29 ml/min (HCC) Increase fluids, avoid NSAIDS, monitor sugars, will monitor Continue follow up with nephrology  Vitamin D deficiency Stopped by nephrology  Medication management CBC, CMP/GFR  Mixed hyperlipidemia Continue medications Continue low cholesterol diet and exercise.  Check lipid panel.   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, CKD; declines B12 check today, TSHs have been normal  She is willing to add sublingual b12 trial Not a candidate for gabapentin due to renal functions Discussed topical capsaisin or can try voltaren gel at  night  Over 40 minutes of exam, counseling, chart review and critical decision making was performed Future Appointments  Date Time Provider [place]  08/25/2019  2:30 PM Unk Pinto, MD [facility] None  03/24/2020  3:00 PM Unk Pinto, MD [facility] None     Plan:   During the course of the visit the patient was educated and counseled about appropriate screening and preventive services including:    Pneumococcal vaccine   Prevnar 13  Influenza vaccine  Td vaccine  Screening electrocardiogram  Bone densitometry screening  Colorectal cancer screening  Diabetes screening  Glaucoma screening  Nutrition counseling   Advanced directives: requested   Subjective:  Kelly Cantu is a 78 y.o. female who presents for Medicare Annual Wellness Visit and 3 month follow up.   Patient has hx/o GERD controlled by lifestyle and meds (famotidine).  She is followed by Dr. 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   BMI is Body mass index is 31.96 kg/m., she has been working on diet, trying to do better after gaining weight since covid 19 social distancing and exercise has been limited due to recent rain, will restart walking.  Wt Readings from Last 3 Encounters:  05/21/19 180 lb 6.4 oz (81.8 kg)  02/11/19 175 lb 9.6 oz (79.7 kg)  12/24/18 178 lb (80.7 kg)    Her blood pressure has been controlled at home, today their BP is BP: (!) 142/80 She does not workout. She denies chest pain, shortness of breath, dizziness.   She is on cholesterol medication (atorvastatin 20 mg daily)  and denies myalgias. Her LDL cholesterol is at goal; triglycerides remain elevated. The cholesterol last visit was:   Lab Results  Component Value Date   CHOL 177 02/11/2019   HDL 46 (L) 02/11/2019   LDLCALC 100 (H) 02/11/2019   TRIG 192 (  H) 02/11/2019   CHOLHDL 3.8 02/11/2019    She has been working on diet for prediabetes, and denies foot ulcerations, increased appetite, nausea,  paresthesia of the feet, polydipsia, polyuria, visual disturbances, vomiting and weight loss. Last A1C in the office was:  Lab Results  Component Value Date   HGBA1C 6.1 (H) 02/11/2019   Last GFR: Lab Results  Component Value Date   GFRNONAA 26 (L) 02/11/2019   Patient is no longer on Vitamin D supplement per nephrology instructions:  Lab Results  Component Value Date   VD25OH 37 02/11/2019     Patient is on allopurinol for gout and does not report a recent flare.  Lab Results  Component Value Date   LABURIC 4.1 02/11/2019    Lab Results  Component Value Date   [id number] 329 08/12/2011     Medication Review: Current Outpatient Medications on File Prior to Visit  Medication Sig Dispense Refill  . allopurinol (ZYLOPRIM) 300 MG tablet TAKE 1 TABLET BY MOUTH EVERY DAY 90 tablet 99  . atenolol (TENORMIN) 50 MG tablet TAKE 1 TABLET DAILY FOR BLOOD PRESSURE 90 tablet 1  . atorvastatin (LIPITOR) 20 MG tablet TAKE 1 TABLET DAILY AS DIRECTED FOR CHOLESTEROL 90 tablet 3  . BABY ASPIRIN PO Take 81 mg by mouth daily.    . calcitRIOL (ROCALTROL) 0.25 MCG capsule Take 0.25 mcg by mouth daily.    . diclofenac sodium (VOLTAREN) 1 % GEL Apply 4 g topically 4 (four) times daily as needed. 500 g 6  . famotidine (PEPCID) 40 MG tablet TAKE 1 TABLET(40 MG) BY MOUTH EVERY EVENING 90 tablet 1  . furosemide (LASIX) 40 MG tablet TAKE 1 TABLET DAILY FOR BLOOD PRESSURE AND FLUID 90 tablet 1  . Multiple Vitamins-Minerals (ICAPS AREDS 2 PO) Take 1 capsule by mouth daily.    . Multiple Vitamins-Minerals (MULTIVITAMIN PO) Take by mouth daily.    . Omega-3 Fatty Acids (FISH OIL PO) Take by mouth daily.     No current facility-administered medications on file prior to visit.     Allergies  Allergen Reactions  . Ace Inhibitors     Pt unsure of reaction  . Feldene [Piroxicam] Rash    Current Problems (verified) Patient Active Problem List   Diagnosis Date Noted  . FHx: heart disease 07/31/2018  .  Former smoker 07/31/2018  . Type 2 diabetes mellitus with stage 4 chronic kidney disease, without long-term current use of insulin ([facility]) 07/31/2018  . Secondary hyperparathyroidism of renal origin ([facility]) 04/10/2018  . Encounter for Medicare annual wellness exam 11/13/2015  . Obesity (BMI 30.0-34.9) 11/11/2015  . Idiopathic gout 04/11/2015  . Vitamin D deficiency 09/29/2014  . Medication management 09/29/2014  . CKD (chronic kidney disease) stage 4, GFR 15-29 ml/min (HCC) 07/22/2014  . Hyperlipidemia, mixed   . Essential hypertension   . Other abnormal glucose (prediabetes)   . GERD (gastroesophageal reflux disease)   . DJD (degenerative joint disease)   . DDD (degenerative disc disease), lumbar   . IBS (irritable bowel syndrome)     Screening Tests Immunization History  Administered Date(s) Administered  . DT 09/29/2014, 09/20/2015  . Influenza Split 09/28/2013  . Influenza, High Dose Seasonal PF 10/13/2014, 09/03/2016, 10/01/2017, 11/03/2018  . Influenza-Unspecified 08/04/2015  . Pneumococcal Conjugate-13 10/13/2014  . Pneumococcal Polysaccharide-23 09/23/2012  . Td 09/02/2004  . Zoster 10/13/2013   Preventative care: Last colonoscopy: 2015, DONE due to age unless problems Last mammogram: 04/2019 Last pap smear/pelvic exam: remote, s/p abd hysterectomy  [id number], 07/2018 T 0.4 normal CXR 2015 US renal 2012  Prior vaccinations: TD or Tdap: 2016 Influenza: 2019  Pneumococcal: 2013 Prevnar13: 2015 Shingles/Zostavax: 2014  Names of Other Physician/Practitioners you currently use: 1. [facility] here for primary care 2. Dr. Nicki Reaper, eye doctor, last visit 2020 3. Randol Kern, dentist, last visit 2020 q6 months  Patient Care Team: Unk Pinto, MD as PCP - General (Internal Medicine) Deterding, Jeneen Rinks, MD as Consulting Physician (Nephrology) Gatha Mayer, MD as Consulting Physician (Gastroenterology) Macarthur Critchley, OD as Referring  Physician (Optometry)  SURGICAL HISTORY She  has a past surgical history that includes Abdominal hysterectomy; Cataract extraction; and Colonoscopy (x 2). FAMILY HISTORY Her family history includes Cancer in her mother and sister; Heart attack in her father; Heart disease in her father; Hypertension in her sister; Liver cancer in her mother. SOCIAL HISTORY She  reports that she quit smoking about 14 years ago. She has never used smokeless tobacco. She reports that she does not drink alcohol or use drugs.   MEDICARE WELLNESS OBJECTIVES: Physical activity:   Cardiac risk factors:   Depression/mood screen:   Depression screen [facility] 2/9 05/21/2019  Decreased Interest 0  Down, Depressed, Hopeless 0  PHQ - 2 Score 0    ADLs:  In your present state of health, do you have any difficulty performing the following activities: 02/11/2019 08/04/2018  Hearing? N N  Vision? N N  Difficulty concentrating or making decisions? N N  Walking or climbing stairs? N N  Dressing or bathing? N N  Doing errands, shopping? N N  Some recent data might be hidden     Cognitive Testing  Alert? Yes  Normal Appearance?Yes  Oriented to person? Yes  Place? Yes   Time? Yes  Recall of three objects?  Yes  Can perform simple calculations? Yes  Displays appropriate judgment?Yes  Can read the correct time from a watch face?Yes  EOL planning: Does Patient Have a Medical Advance Directive?: Yes Type of Advance Directive: Healthcare Power of Attorney, Living will Does patient want to make changes to medical advance directive?: No - Patient declined Copy of [place] in Chart?: No - copy requested  Review of Systems  Constitutional: Negative for malaise/fatigue and weight loss.  HENT: Negative for hearing loss and tinnitus.   Eyes: Negative for blurred vision and double vision.  Respiratory: Negative for cough, sputum production, shortness of breath and wheezing.   Cardiovascular: Negative for  chest pain, palpitations, orthopnea, claudication, leg swelling and PND.  Gastrointestinal: Negative for abdominal pain, blood in stool, constipation, diarrhea, heartburn, melena, nausea and vomiting.  Genitourinary: Negative.   Musculoskeletal: Negative for falls, joint pain and myalgias.  Skin: Negative for rash.  Neurological: Positive for sensory change (intermittent bilateral feet, ). Negative for dizziness, tingling, weakness and headaches.  Endo/Heme/Allergies: Negative for polydipsia.  Psychiatric/Behavioral: Negative.  Negative for depression, memory loss, substance abuse and suicidal ideas. The patient is not nervous/anxious and does not have insomnia.   All other systems reviewed and are negative.    Objective:     Today's Vitals   05/21/19 1457  BP: (!) 142/80  Pulse: (!) 59  Temp: (!) 97.2 F (36.2 C)  SpO2: 97%  Weight: 180 lb 6.4 oz (81.8 kg)  Height: 5\' 3"  (1.6 m)   Body mass index is 31.96 kg/m.  General appearance: alert, no distress, WD/WN, female HEENT: normocephalic, sclerae anicteric, TMs pearly, nares patent, no discharge or erythema, pharynx normal  Oral cavity: MMM, no lesions Neck: supple, no lymphadenopathy, no thyromegaly, no masses Heart: RRR, normal S1, S2, no murmurs Lungs: CTA bilaterally, no wheezes, rhonchi, or rales Abdomen: +bs, soft, non tender, non distended, no masses, no hepatomegaly, no splenomegaly Musculoskeletal: nontender, no swelling, no obvious deformity Extremities: no edema, no cyanosis, no clubbing Pulses: 2+ symmetric, upper and lower extremities, normal cap refill Neurological: alert, oriented x 3, CN2-12 intact, strength normal upper extremities and lower extremities,, DTRs 2+ throughout, no cerebellar signs, gait normal, sensation decreased in bilateral feet, 3-4/10 to monofilament bilaterally  Psychiatric: normal affect, behavior normal, pleasant   Medicare Attestation I have personally reviewed: The patient's medical and  social history Their use of alcohol, tobacco or illicit drugs Their current medications and supplements The patient's functional ability including ADLs,fall risks, home safety risks, cognitive, and hearing and visual impairment Diet and physical activities Evidence for depression or mood disorders  The patient's weight, height, BMI, and visual acuity have been recorded in the chart.  I have made referrals, counseling, and provided education to the patient based on review of the above and I have provided the patient with a written personalized care plan for preventive services.     Izora Ribas, NP   05/21/2019

## 2019-05-21 ENCOUNTER — Ambulatory Visit (INDEPENDENT_AMBULATORY_CARE_PROVIDER_SITE_OTHER): Payer: Medicare Other | Admitting: Adult Health

## 2019-05-21 ENCOUNTER — Other Ambulatory Visit: Payer: Self-pay

## 2019-05-21 ENCOUNTER — Encounter: Payer: Self-pay | Admitting: Adult Health

## 2019-05-21 VITALS — BP 142/80 | HR 59 | Temp 97.2°F | Ht 63.0 in | Wt 180.4 lb

## 2019-05-21 DIAGNOSIS — M5136 Other intervertebral disc degeneration, lumbar region: Secondary | ICD-10-CM

## 2019-05-21 DIAGNOSIS — I10 Essential (primary) hypertension: Secondary | ICD-10-CM

## 2019-05-21 DIAGNOSIS — K219 Gastro-esophageal reflux disease without esophagitis: Secondary | ICD-10-CM | POA: Diagnosis not present

## 2019-05-21 DIAGNOSIS — E66811 Obesity, class 1: Secondary | ICD-10-CM

## 2019-05-21 DIAGNOSIS — E669 Obesity, unspecified: Secondary | ICD-10-CM

## 2019-05-21 DIAGNOSIS — E782 Mixed hyperlipidemia: Secondary | ICD-10-CM

## 2019-05-21 DIAGNOSIS — M150 Primary generalized (osteo)arthritis: Secondary | ICD-10-CM | POA: Diagnosis not present

## 2019-05-21 DIAGNOSIS — M51369 Other intervertebral disc degeneration, lumbar region without mention of lumbar back pain or lower extremity pain: Secondary | ICD-10-CM

## 2019-05-21 DIAGNOSIS — E559 Vitamin D deficiency, unspecified: Secondary | ICD-10-CM | POA: Diagnosis not present

## 2019-05-21 DIAGNOSIS — Z0001 Encounter for general adult medical examination with abnormal findings: Secondary | ICD-10-CM | POA: Diagnosis not present

## 2019-05-21 DIAGNOSIS — G629 Polyneuropathy, unspecified: Secondary | ICD-10-CM | POA: Insufficient documentation

## 2019-05-21 DIAGNOSIS — K589 Irritable bowel syndrome without diarrhea: Secondary | ICD-10-CM

## 2019-05-21 DIAGNOSIS — Z79899 Other long term (current) drug therapy: Secondary | ICD-10-CM | POA: Diagnosis not present

## 2019-05-21 DIAGNOSIS — N2581 Secondary hyperparathyroidism of renal origin: Secondary | ICD-10-CM | POA: Diagnosis not present

## 2019-05-21 DIAGNOSIS — N184 Chronic kidney disease, stage 4 (severe): Secondary | ICD-10-CM

## 2019-05-21 DIAGNOSIS — R7309 Other abnormal glucose: Secondary | ICD-10-CM | POA: Diagnosis not present

## 2019-05-21 DIAGNOSIS — Z0000 Encounter for general adult medical examination without abnormal findings: Secondary | ICD-10-CM

## 2019-05-21 DIAGNOSIS — R6889 Other general symptoms and signs: Secondary | ICD-10-CM

## 2019-05-21 DIAGNOSIS — E1122 Type 2 diabetes mellitus with diabetic chronic kidney disease: Secondary | ICD-10-CM | POA: Diagnosis not present

## 2019-05-21 DIAGNOSIS — M1000 Idiopathic gout, unspecified site: Secondary | ICD-10-CM | POA: Diagnosis not present

## 2019-05-21 DIAGNOSIS — Z87891 Personal history of nicotine dependence: Secondary | ICD-10-CM

## 2019-05-21 DIAGNOSIS — M159 Polyosteoarthritis, unspecified: Secondary | ICD-10-CM

## 2019-05-21 DIAGNOSIS — G589 Mononeuropathy, unspecified: Secondary | ICD-10-CM

## 2019-05-21 MED ORDER — FAMOTIDINE 20 MG PO TABS: ORAL_TABLET | ORAL | 1 refills | Status: DC

## 2019-05-21 NOTE — Patient Instructions (Addendum)
Kelly Cantu , Thank you for taking time to come for your Medicare Wellness Visit. I appreciate your ongoing commitment to your health goals. Please review the following plan we discussed and let me know if I can assist you in the future.   These are the goals we discussed: Goals    . Exercise 150 min/wk Moderate Activity    . Weight (lb) < 175 lb (79.4 kg)       This is a list of the screening recommended for you and due dates:  Health Maintenance  Topic Date Due  . Eye exam for diabetics  09/25/2017  . Complete foot exam   01/06/2019  . Flu Shot  07/04/2019  . Hemoglobin A1C  08/14/2019  . Urine Protein Check  02/11/2020  . Tetanus Vaccine  09/19/2025  . DEXA scan (bone density measurement)  Completed  . Pneumonia vaccines  Completed     Try adding a sublingual B12 vitamin for a few months; may help with neuropathy, improve energy levels  Can try topical arthritis pain creams (can get over the counter) or try topical capsaisin cream (such as capzasin) at night if pain is bothersome and distracting   Peripheral Neuropathy Peripheral neuropathy is a type of nerve damage. It affects nerves that carry signals between the spinal cord and the arms, legs, and the rest of the body (peripheral nerves). It does not affect nerves in the spinal cord or brain. In peripheral neuropathy, one nerve or a group of nerves may be damaged. Peripheral neuropathy is a broad category that includes many specific nerve disorders, like diabetic neuropathy, hereditary neuropathy, and carpal tunnel syndrome. What are the causes? This condition may be caused by:  Diabetes. This is the most common cause of peripheral neuropathy.  Nerve injury.  Pressure or stress on a nerve that lasts a long time.  Lack (deficiency) of B vitamins. This can result from alcoholism, poor diet, or a restricted diet.  Infections.  Autoimmune diseases, such as rheumatoid arthritis and systemic lupus erythematosus.  Nerve  diseases that are passed from parent to child (inherited).  Some medicines, such as cancer medicines (chemotherapy).  Poisonous (toxic) substances, such as lead and mercury.  Too little blood flowing to the legs.  Kidney disease.  Thyroid disease. In some cases, the cause of this condition is not known. What are the signs or symptoms? Symptoms of this condition depend on which of your nerves is damaged. Common symptoms include:  Loss of feeling (numbness) in the feet, hands, or both.  Tingling in the feet, hands, or both.  Burning pain.  Very sensitive skin.  Weakness.  Not being able to move a part of the body (paralysis).  Muscle twitching.  Clumsiness or poor coordination.  Loss of balance.  Not being able to control your bladder.  Feeling dizzy.  Sexual problems. How is this diagnosed? Diagnosing and finding the cause of peripheral neuropathy can be difficult. Your health care provider will take your medical history and do a physical exam. A neurological exam will also be done. This involves checking things that are affected by your brain, spinal cord, and nerves (nervous system). For example, your health care provider will check your reflexes, how you move, and what you can feel. You may have other tests, such as:  Blood tests.  Electromyogram (EMG) and nerve conduction tests. These tests check nerve function and how well the nerves are controlling the muscles.  Imaging tests, such as CT scans or MRI to  rule out other causes of your symptoms.  Removing a small piece of nerve to be examined in a lab (nerve biopsy). This is rare.  Removing and examining a small amount of the fluid that surrounds the brain and spinal cord (lumbar puncture). This is rare. How is this treated? Treatment for this condition may involve:  Treating the underlying cause of the neuropathy, such as diabetes, kidney disease, or vitamin deficiencies.  Stopping medicines that can cause  neuropathy, such as chemotherapy.  Medicine to relieve pain. Medicines may include: ? Prescription or over-the-counter pain medicine. ? Antiseizure medicine. ? Antidepressants. ? Pain-relieving patches that are applied to painful areas of skin.  Surgery to relieve pressure on a nerve or to destroy a nerve that is causing pain.  Physical therapy to help improve movement and balance.  Devices to help you move around (assistive devices). Follow these instructions at home: Medicines  Take over-the-counter and prescription medicines only as told by your health care provider. Do not take any other medicines without first asking your health care provider.  Do not drive or use heavy machinery while taking prescription pain medicine. Lifestyle   Do not use any products that contain nicotine or tobacco, such as cigarettes and e-cigarettes. Smoking keeps blood from reaching damaged nerves. If you need help quitting, ask your health care provider.  Avoid or limit alcohol. Too much alcohol can cause a vitamin B deficiency, and vitamin B is needed for healthy nerves.  Eat a healthy diet. This includes: ? Eating foods that are high in fiber, such as fresh fruits and vegetables, whole grains, and beans. ? Limiting foods that are high in fat and processed sugars, such as fried or sweet foods. General instructions   If you have diabetes, work closely with your health care provider to keep your blood sugar under control.  If you have numbness in your feet: ? Check every day for signs of injury or infection. Watch for redness, warmth, and swelling. ? Wear padded socks and comfortable shoes. These help protect your feet.  Develop a good support system. Living with peripheral neuropathy can be stressful. Consider talking with a mental health specialist or joining a support group.  Use assistive devices and attend physical therapy as told by your health care provider. This may include using a walker  or a cane.  Keep all follow-up visits as told by your health care provider. This is important. Contact a health care provider if:  You have new signs or symptoms of peripheral neuropathy.  You are struggling emotionally from dealing with peripheral neuropathy.  Your pain is not well-controlled. Get help right away if:  You have an injury or infection that is not healing normally.  You develop new weakness in an arm or leg.  You fall frequently. Summary  Peripheral neuropathy is when the nerves in the arms, or legs are damaged, resulting in numbness, weakness, or pain.  There are many causes of peripheral neuropathy, including diabetes, pinched nerves, vitamin deficiencies, autoimmune disease, and hereditary conditions.  Diagnosing and finding the cause of peripheral neuropathy can be difficult. Your health care provider will take your medical history, do a physical exam, and do tests, including blood tests and nerve function tests.  Treatment involves treating the underlying cause of the neuropathy and taking medicines to help control pain. Physical therapy and assistive devices may also help. This information is not intended to replace advice given to you by your health care provider. Make sure you discuss  any questions you have with your health care provider. Document Released: 11/09/2002 Document Revised: 01/28/2017 Document Reviewed: 01/28/2017 Elsevier Interactive Patient Education  2019 [employer].

## 2019-05-22 LAB — LIPID PANEL
Cholesterol: 179 mg/dL (ref <200)
HDL: 45 mg/dL — ABNORMAL LOW (ref >=50)
LDL Cholesterol (Calc): 98 mg/dL (calc)
Non-HDL Cholesterol (Calc): 134 mg/dL (calc) — ABNORMAL HIGH (ref <130)
Total CHOL/HDL Ratio: 4 (calc) (ref <5.0)
Triglycerides: 255 mg/dL — ABNORMAL HIGH (ref <150)

## 2019-05-22 LAB — COMPLETE METABOLIC PANEL WITH GFR
AG Ratio: 1.9 (calc) (ref 1.0–2.5)
ALT: 14 U/L (ref 6–29)
AST: 18 U/L (ref 10–35)
Albumin: 4.2 g/dL (ref 3.6–5.1)
Alkaline phosphatase (APISO): 100 U/L (ref 37–153)
BUN/Creatinine Ratio: 24 (calc) — ABNORMAL HIGH (ref 6–22)
BUN: 41 mg/dL — ABNORMAL HIGH (ref 7–25)
CO2: 30 mmol/L (ref 20–32)
Calcium: 10.2 mg/dL (ref 8.6–10.4)
Chloride: 101 mmol/L (ref 98–110)
Creat: 1.72 mg/dL — ABNORMAL HIGH (ref 0.60–0.93)
GFR, Est African American: 33 mL/min/{1.73_m2} — ABNORMAL LOW (ref >=60)
GFR, Est Non African American: 28 mL/min/{1.73_m2} — ABNORMAL LOW (ref >=60)
Globulin: 2.2 g/dL (calc) (ref 1.9–3.7)
Glucose, Bld: 97 mg/dL (ref 65–99)
Potassium: 4.3 mmol/L (ref 3.5–5.3)
Sodium: 140 mmol/L (ref 135–146)
Total Bilirubin: 0.9 mg/dL (ref 0.2–1.2)
Total Protein: 6.4 g/dL (ref 6.1–8.1)

## 2019-05-22 LAB — CBC WITH DIFFERENTIAL/PLATELET
Absolute Monocytes: 644 cells/uL (ref 200–950)
Basophils Absolute: 37 cells/uL (ref 0–200)
Basophils Relative: 0.4 %
Eosinophils Absolute: 212 cells/uL (ref 15–500)
Eosinophils Relative: 2.3 %
HCT: 36.7 % (ref 35.0–45.0)
Hemoglobin: 12.3 g/dL (ref 11.7–15.5)
Lymphs Abs: 2456 cells/uL (ref 850–3900)
MCH: 30.1 pg (ref 27.0–33.0)
MCHC: 33.5 g/dL (ref 32.0–36.0)
MCV: 90 fL (ref 80.0–100.0)
MPV: 11.4 fL (ref 7.5–12.5)
Monocytes Relative: 7 %
Neutro Abs: 5851 cells/uL (ref 1500–7800)
Neutrophils Relative %: 63.6 %
Platelets: 256 10*3/uL (ref 140–400)
RBC: 4.08 10*6/uL (ref 3.80–5.10)
RDW: 14 % (ref 11.0–15.0)
Total Lymphocyte: 26.7 %
WBC: 9.2 10*3/uL (ref 3.8–10.8)

## 2019-05-22 LAB — TSH: TSH: 1.76 mIU/L (ref 0.40–4.50)

## 2019-05-22 LAB — HEMOGLOBIN A1C
Hgb A1c MFr Bld: 5.9 % of total Hgb — ABNORMAL HIGH (ref <5.7)
Mean Plasma Glucose: 123 (calc)
eAG (mmol/L): 6.8 (calc)

## 2019-05-22 LAB — MAGNESIUM: Magnesium: 2.1 mg/dL (ref 1.5–2.5)

## 2019-07-01 ENCOUNTER — Other Ambulatory Visit: Payer: Self-pay | Admitting: Internal Medicine

## 2019-08-03 DIAGNOSIS — I129 Hypertensive chronic kidney disease with stage 1 through stage 4 chronic kidney disease, or unspecified chronic kidney disease: Secondary | ICD-10-CM | POA: Diagnosis not present

## 2019-08-03 DIAGNOSIS — N2581 Secondary hyperparathyroidism of renal origin: Secondary | ICD-10-CM | POA: Diagnosis not present

## 2019-08-03 DIAGNOSIS — N183 Chronic kidney disease, stage 3 (moderate): Secondary | ICD-10-CM | POA: Diagnosis not present

## 2019-08-03 DIAGNOSIS — N189 Chronic kidney disease, unspecified: Secondary | ICD-10-CM | POA: Diagnosis not present

## 2019-08-03 DIAGNOSIS — D631 Anemia in chronic kidney disease: Secondary | ICD-10-CM | POA: Diagnosis not present

## 2019-08-24 ENCOUNTER — Encounter: Payer: Self-pay | Admitting: Internal Medicine

## 2019-08-24 NOTE — Progress Notes (Signed)
History of Present Illness:      This very nice 78 y.o.  [name] presents for 6 month follow up with HTN, HLD, T2_DM and Vitamin D Deficiency. Patient has hx/o Gout controlled on her meds. Likewise her GERD is controlled with diet & meds.      Patient is treated for HTN (1991) & BP has been controlled at home. Today's BP is at goal - 126/72. Patient has had no complaints of any cardiac type chest pain, palpitations, dyspnea / orthopnea / PND, dizziness, claudication, or dependent edema.  Hyperlipidemia is controlled with diet & meds. Patient denies myalgias or other med SE's. Last Lipids were  At goal albeit elevated Trig's: Lab Results  Component Value Date   CHOL 179 05/21/2019   HDL 45 (L) 05/21/2019   LDLCALC 98 05/21/2019   TRIG 255 (H) 05/21/2019   CHOLHDL 4.0 05/21/2019       Also, the patient has history of PreDiabetes  (A1c 6.0% / 2014) and then  T2_NIDDM (A1c 6.6% / 2017) which she's attempting to control with diet. 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 She has had no symptoms of reactive hypoglycemia, diabetic polys, paresthesias or visual blurring.  Last A1c was not at goal:  Lab Results  Component Value Date   HGBA1C 5.9 (H) 05/21/2019       Further, the patient also has history of Vitamin D Deficiency ("34" / 2008) and supplements vitamin D without any suspected side-effects. Last vitamin D was still low:  Lab Results  Component Value Date   VD25OH 37 02/11/2019    Current Outpatient Medications on File Prior to Visit  Medication Sig  . allopurinol (ZYLOPRIM) 300 MG tablet TAKE 1 TABLET BY MOUTH EVERY DAY  . atenolol (TENORMIN) 50 MG tablet TAKE 1 TABLET DAILY FOR BLOOD PRESSURE  . atorvastatin (LIPITOR) 20 MG tablet TAKE 1 TABLET DAILY AS DIRECTED FOR CHOLESTEROL  . BABY ASPIRIN PO Take 81 mg by mouth daily.  . calcitRIOL (ROCALTROL) 0.25 MCG capsule Take 0.25 mcg by mouth daily.  . Cholecalciferol (VITAMIN D3) 125 MCG (5000 UT) CAPS Take by mouth daily.   . diclofenac sodium (VOLTAREN) 1 % GEL Apply 4 g topically 4 (four) times daily as needed.  . famotidine (PEPCID) 20 MG tablet TAKE 1 TABLET BY MOUTH TWICE DAILY AS NEEDED FOR REFLUX  . furosemide (LASIX) 40 MG tablet TAKE 1 TABLET DAILY FOR BLOOD PRESSURE AND FLUID  . Multiple Vitamins-Minerals (ICAPS AREDS 2 PO) Take 1 capsule by mouth daily.  . Multiple Vitamins-Minerals (MULTIVITAMIN PO) Take by mouth daily.  . Omega-3 Fatty Acids (FISH OIL PO) Take by mouth daily.   No current facility-administered medications on file prior to visit.    Allergies  Allergen Reactions  . Ace Inhibitors     Pt unsure of reaction  . Feldene [Piroxicam] Rash    PMHx:   Past Medical History:  Diagnosis Date  . Cataract   . Chronic kidney disease   . DDD (degenerative disc disease), lumbar   . DJD (degenerative joint disease)   . GERD (gastroesophageal reflux disease)   . Hyperlipidemia   . Hypertension   . IBS (irritable bowel syndrome)   . Prediabetes    Immunization History  Administered Date(s) Administered  . DT 09/29/2014, 09/20/2015  . Influenza Split 09/28/2013  . Influenza, High Dose Seasonal PF 10/13/2014, 09/03/2016, 10/01/2017, 11/03/2018  . Influenza-Unspecified 08/04/2015  . Pneumococcal Conjugate-13 10/13/2014  .  Pneumococcal Polysaccharide-23 09/23/2012  . Td 09/02/2004  . Zoster 10/13/2013   Past Surgical History:  Procedure Laterality Date  . ABDOMINAL HYSTERECTOMY    . CATARACT EXTRACTION Bilateral   . COLONOSCOPY  x 2   w/Dr Carlean Purl    FHx:    Reviewed / unchanged  SHx:    Reviewed / unchanged   Systems Review:  Constitutional: Denies fever, chills, wt changes, headaches, insomnia, fatigue, night sweats, change in appetite. Eyes: Denies redness, blurred vision, diplopia, discharge, itchy, watery eyes.  ENT: Denies discharge, congestion, post nasal drip, epistaxis, sore throat, earache, hearing loss, dental pain, tinnitus, vertigo, sinus pain, snoring.  CV:  Denies chest pain, palpitations, irregular heartbeat, syncope, dyspnea, diaphoresis, orthopnea, PND, claudication or edema. Respiratory: denies cough, dyspnea, DOE, pleurisy, hoarseness, laryngitis, wheezing.  Gastrointestinal: Denies dysphagia, odynophagia, heartburn, reflux, water brash, abdominal pain or cramps, nausea, vomiting, bloating, diarrhea, constipation, hematemesis, melena, hematochezia  or hemorrhoids. Genitourinary: Denies dysuria, frequency, urgency, nocturia, hesitancy, discharge, hematuria or flank pain. Musculoskeletal: Denies arthralgias, myalgias, stiffness, jt. swelling, pain, limping or strain/sprain.  Skin: Denies pruritus, rash, hives, warts, acne, eczema or change in skin lesion(s). Neuro: No weakness, tremor, incoordination, spasms, paresthesia or pain. Psychiatric: Denies confusion, memory loss or sensory loss. Endo: Denies change in weight, skin or hair change.  Heme/Lymph: No excessive bleeding, bruising or enlarged lymph nodes.  Physical Exam  BP 126/72   Pulse (!) 59   Temp (!) 97.5 F (36.4 C)   Ht 5\' 3"  (1.6 m)   Wt 186 lb (84.4 kg)   SpO2 99%   BMI 32.95 kg/m   Appears  well nourished, well groomed  and in no distress.  Eyes: PERRLA, EOMs, conjunctiva no swelling or erythema. Sinuses: No frontal/maxillary tenderness ENT/Mouth: EAC's clear, TM's nl w/o erythema, bulging. Nares clear w/o erythema, swelling, exudates. Oropharynx clear without erythema or exudates. Oral hygiene is good. Tongue normal, non obstructing. Hearing intact.  Neck: Supple. Thyroid not palpable. Car 2+/2+ without bruits, nodes or JVD. Chest: Respirations nl with BS clear & equal w/o rales, rhonchi, wheezing or stridor.  Cor: Heart sounds normal w/ regular rate and rhythm without sig. murmurs, gallops, clicks or rubs. Peripheral pulses normal and equal  without edema.  Abdomen: Soft & bowel sounds normal. Non-tender w/o guarding, rebound, hernias, masses or organomegaly.   Lymphatics: Unremarkable.  Musculoskeletal: Full ROM all peripheral extremities, joint stability, 5/5 strength and normal gait.  Skin: Warm, dry without exposed rashes, lesions or ecchymosis apparent.  Neuro: Cranial nerves intact, reflexes equal bilaterally. Sensory-motor testing grossly intact. Tendon reflexes grossly intact.  Pysch: Alert & oriented x 3.  Insight and judgement nl & appropriate. No ideations.  Assessment and Plan:  1. Essential hypertension  - Continue medication, monitor blood pressure at home.  - Continue DASH diet.  Reminder to go to the ER if any CP,  SOB, nausea, dizziness, severe HA, changes vision/speech.  - CBC with Differential/Platelet - COMPLETE METABOLIC PANEL WITH GFR - Magnesium - TSH  2. Hyperlipidemia, mixed  - Continue diet/meds, exercise,& lifestyle modifications.  - Continue monitor periodic cholesterol/liver & renal functions   - Lipid panel  3. Type 2 diabetes mellitus with stage 4 chronic kidney disease, without long-term current use of insulin (HCC)  - Continue diet, exercise  - Lifestyle modifications.  - Monitor appropriate labs.  - Hemoglobin A1c - Insulin, random  4. Vitamin D deficiency  - Continue supplementation.  - VITAMIN D 25 Hydroxyl  5. Gastroesophageal reflux disease  -  CBC with Differential/Platelet  6. Idiopathic gout  - Uric acid  7. Medication management  - CBC with Differential/Platelet - COMPLETE METABOLIC PANEL WITH GFR - Magnesium - Lipid panel - TSH - Hemoglobin A1c - Insulin, random - VITAMIN D 25 Hydroxyl - Uric acid       Discussed  regular exercise, BP monitoring, weight control to achieve/maintain BMI less than 25 and discussed med and SE's. Recommended labs to assess and monitor clinical status with further disposition pending results of labs.  I discussed the assessment and treatment plan with the patient. The patient was provided an opportunity to ask questions and all were answered.  The patient agreed with the plan and demonstrated an understanding of the instructions.  I provided over 30 minutes of exam, counseling, chart review and  complex critical decision making.  Kirtland Bouchard, MD

## 2019-08-24 NOTE — Patient Instructions (Signed)

## 2019-08-25 ENCOUNTER — Ambulatory Visit (INDEPENDENT_AMBULATORY_CARE_PROVIDER_SITE_OTHER): Payer: Medicare Other | Admitting: Internal Medicine

## 2019-08-25 ENCOUNTER — Encounter: Payer: Self-pay | Admitting: Internal Medicine

## 2019-08-25 ENCOUNTER — Other Ambulatory Visit: Payer: Self-pay

## 2019-08-25 VITALS — BP 126/72 | HR 59 | Temp 97.5°F | Ht 63.0 in | Wt 186.0 lb

## 2019-08-25 DIAGNOSIS — Z79899 Other long term (current) drug therapy: Secondary | ICD-10-CM | POA: Diagnosis not present

## 2019-08-25 DIAGNOSIS — E559 Vitamin D deficiency, unspecified: Secondary | ICD-10-CM

## 2019-08-25 DIAGNOSIS — E782 Mixed hyperlipidemia: Secondary | ICD-10-CM | POA: Diagnosis not present

## 2019-08-25 DIAGNOSIS — I10 Essential (primary) hypertension: Secondary | ICD-10-CM | POA: Diagnosis not present

## 2019-08-25 DIAGNOSIS — E1122 Type 2 diabetes mellitus with diabetic chronic kidney disease: Secondary | ICD-10-CM | POA: Diagnosis not present

## 2019-08-25 DIAGNOSIS — N184 Chronic kidney disease, stage 4 (severe): Secondary | ICD-10-CM

## 2019-08-25 DIAGNOSIS — K219 Gastro-esophageal reflux disease without esophagitis: Secondary | ICD-10-CM | POA: Diagnosis not present

## 2019-08-25 DIAGNOSIS — M1000 Idiopathic gout, unspecified site: Secondary | ICD-10-CM

## 2019-08-26 LAB — COMPLETE METABOLIC PANEL WITH GFR
AG Ratio: 1.9 (calc) (ref 1.0–2.5)
ALT: 13 U/L (ref 6–29)
AST: 19 U/L (ref 10–35)
Albumin: 4 g/dL (ref 3.6–5.1)
Alkaline phosphatase (APISO): 89 U/L (ref 37–153)
BUN/Creatinine Ratio: 27 (calc) — ABNORMAL HIGH (ref 6–22)
BUN: 54 mg/dL — ABNORMAL HIGH (ref 7–25)
CO2: 24 mmol/L (ref 20–32)
Calcium: 9.5 mg/dL (ref 8.6–10.4)
Chloride: 105 mmol/L (ref 98–110)
Creat: 2.03 mg/dL — ABNORMAL HIGH (ref 0.60–0.93)
GFR, Est African American: 27 mL/min/{1.73_m2} — ABNORMAL LOW (ref >=60)
GFR, Est Non African American: 23 mL/min/{1.73_m2} — ABNORMAL LOW (ref >=60)
Globulin: 2.1 g/dL (calc) (ref 1.9–3.7)
Glucose, Bld: 122 mg/dL — ABNORMAL HIGH (ref 65–99)
Potassium: 4.6 mmol/L (ref 3.5–5.3)
Sodium: 141 mmol/L (ref 135–146)
Total Bilirubin: 0.6 mg/dL (ref 0.2–1.2)
Total Protein: 6.1 g/dL (ref 6.1–8.1)

## 2019-08-26 LAB — INSULIN, RANDOM: Insulin: 29.9 u[IU]/mL — ABNORMAL HIGH

## 2019-08-26 LAB — TSH: TSH: 1.79 mIU/L (ref 0.40–4.50)

## 2019-08-26 LAB — CBC WITH DIFFERENTIAL/PLATELET
Absolute Monocytes: 801 cells/uL (ref 200–950)
Basophils Absolute: 36 cells/uL (ref 0–200)
Basophils Relative: 0.4 %
Eosinophils Absolute: 207 cells/uL (ref 15–500)
Eosinophils Relative: 2.3 %
HCT: 35.1 % (ref 35.0–45.0)
Hemoglobin: 12.1 g/dL (ref 11.7–15.5)
Lymphs Abs: 2331 cells/uL (ref 850–3900)
MCH: 31 pg (ref 27.0–33.0)
MCHC: 34.5 g/dL (ref 32.0–36.0)
MCV: 90 fL (ref 80.0–100.0)
MPV: 11.6 fL (ref 7.5–12.5)
Monocytes Relative: 8.9 %
Neutro Abs: 5625 cells/uL (ref 1500–7800)
Neutrophils Relative %: 62.5 %
Platelets: 263 10*3/uL (ref 140–400)
RBC: 3.9 10*6/uL (ref 3.80–5.10)
RDW: 14 % (ref 11.0–15.0)
Total Lymphocyte: 25.9 %
WBC: 9 10*3/uL (ref 3.8–10.8)

## 2019-08-26 LAB — HEMOGLOBIN A1C
Hgb A1c MFr Bld: 5.9 % of total Hgb — ABNORMAL HIGH (ref <5.7)
Mean Plasma Glucose: 123 (calc)
eAG (mmol/L): 6.8 (calc)

## 2019-08-26 LAB — MAGNESIUM: Magnesium: 2.1 mg/dL (ref 1.5–2.5)

## 2019-08-26 LAB — LIPID PANEL
Cholesterol: 162 mg/dL (ref <200)
HDL: 41 mg/dL — ABNORMAL LOW (ref >=50)
LDL Cholesterol (Calc): 88 mg/dL (calc)
Non-HDL Cholesterol (Calc): 121 mg/dL (calc) (ref <130)
Total CHOL/HDL Ratio: 4 (calc) (ref <5.0)
Triglycerides: 250 mg/dL — ABNORMAL HIGH (ref <150)

## 2019-08-26 LAB — VITAMIN D 25 HYDROXY (VIT D DEFICIENCY, FRACTURES): Vit D, 25-Hydroxy: 38 ng/mL (ref 30–100)

## 2019-08-26 LAB — URIC ACID: Uric Acid, Serum: 3.9 mg/dL (ref 2.5–7.0)

## 2019-10-20 ENCOUNTER — Other Ambulatory Visit: Payer: Self-pay | Admitting: Internal Medicine

## 2019-12-01 ENCOUNTER — Encounter: Payer: Self-pay | Admitting: Adult Health Nurse Practitioner

## 2019-12-01 ENCOUNTER — Other Ambulatory Visit: Payer: Self-pay

## 2019-12-01 ENCOUNTER — Ambulatory Visit (INDEPENDENT_AMBULATORY_CARE_PROVIDER_SITE_OTHER): Payer: Medicare Other | Admitting: Adult Health Nurse Practitioner

## 2019-12-01 VITALS — BP 120/76 | HR 68 | Temp 97.3°F | Wt 182.0 lb

## 2019-12-01 DIAGNOSIS — N184 Chronic kidney disease, stage 4 (severe): Secondary | ICD-10-CM | POA: Diagnosis not present

## 2019-12-01 DIAGNOSIS — M1000 Idiopathic gout, unspecified site: Secondary | ICD-10-CM | POA: Diagnosis not present

## 2019-12-01 DIAGNOSIS — K219 Gastro-esophageal reflux disease without esophagitis: Secondary | ICD-10-CM

## 2019-12-01 DIAGNOSIS — I10 Essential (primary) hypertension: Secondary | ICD-10-CM

## 2019-12-01 DIAGNOSIS — M5136 Other intervertebral disc degeneration, lumbar region: Secondary | ICD-10-CM

## 2019-12-01 DIAGNOSIS — E1122 Type 2 diabetes mellitus with diabetic chronic kidney disease: Secondary | ICD-10-CM | POA: Diagnosis not present

## 2019-12-01 DIAGNOSIS — M51369 Other intervertebral disc degeneration, lumbar region without mention of lumbar back pain or lower extremity pain: Secondary | ICD-10-CM

## 2019-12-01 DIAGNOSIS — E559 Vitamin D deficiency, unspecified: Secondary | ICD-10-CM | POA: Diagnosis not present

## 2019-12-01 DIAGNOSIS — E782 Mixed hyperlipidemia: Secondary | ICD-10-CM | POA: Diagnosis not present

## 2019-12-01 DIAGNOSIS — K589 Irritable bowel syndrome without diarrhea: Secondary | ICD-10-CM

## 2019-12-01 DIAGNOSIS — M159 Polyosteoarthritis, unspecified: Secondary | ICD-10-CM

## 2019-12-01 DIAGNOSIS — M8949 Other hypertrophic osteoarthropathy, multiple sites: Secondary | ICD-10-CM

## 2019-12-01 DIAGNOSIS — E669 Obesity, unspecified: Secondary | ICD-10-CM

## 2019-12-01 DIAGNOSIS — E66811 Obesity, class 1: Secondary | ICD-10-CM

## 2019-12-01 DIAGNOSIS — M150 Primary generalized (osteo)arthritis: Secondary | ICD-10-CM

## 2019-12-01 DIAGNOSIS — Z79899 Other long term (current) drug therapy: Secondary | ICD-10-CM

## 2019-12-01 MED ORDER — FAMOTIDINE 20 MG PO TABS: ORAL_TABLET | ORAL | 2 refills | Status: DC

## 2019-12-01 NOTE — Progress Notes (Signed)
3 Month Follow Up   Assessment and Plan:   Diagnoses and all orders for this visit:  Essential hypertension Continue current medications:tenormin 50mg  daily Monitor blood pressure at home; call if consistently over 130/80 Continue DASH diet.   Reminder to go to the ER if any CP, SOB, nausea, dizziness, severe HA, changes vision/speech, left arm numbness and tingling and jaw pain.  Hyperlipidemia, mixed Continue medications: Discussed dietary and exercise modifications Low fat diet   Type 2 diabetes mellitus with stage 4 chronic kidney disease, without long-term current use of insulin (HCC) Continued control by diet and exercise, discussed modifications to improve this Increase fluids Avoid NSAIDS Discussed dietary modifications and exercise Will continue to monitor  Idiopathic gout, unspecified chronicity, unspecified site No recent flares noted  CKD (chronic kidney disease) stage 4, GFR 15-29 ml/min (HCC) Increase fluids  Avoid NSAIDS Blood pressure control Monitor sugars  Will continue to monitor Follows with Nephrology, will forward labs  Vitamin D deficiency Continue supplementation Taking Vitamin D 5,000 IU daily  DDD (degenerative disc disease), lumbar Managing at this time Continue current regiment  Irritable bowel syndrome, unspecified type Doing well Controlled with dietary monitoring, avoids triggers Continue to monitor  Primary osteoarthritis involving multiple joints Managing at this time Using voltaren gel PRN  Obesity (BMI 30.0-34.9) Discussed dietary and exercise modifications  Medication management Continued   Continue diet and meds as discussed. Further disposition pending results of labs. Discussed med's effects and SE's.  Patient agrees with plan of care and opportunity to ask questions/voice concerns. Over 30 minutes of chart review, face to face interview, exam, counseling, and critical decision making was performed.   Future  Appointments  Date Time Provider [place]  03/24/2020  3:00 PM Unk Pinto, MD [facility] None  05/25/2020  3:00 PM Liane Comber, NP [facility] None    ----------------------------------------------------------------------------------------------------------------------  HPI 78 y.o. female  presents for 3 month follow up on HTN, HLD, DMII with history of gout, GERD, weight and vitamin D deficiency.   BMI is Body mass index is 32.24 kg/m., she has not been working on diet and exercise. Wt Readings from Last 3 Encounters:  12/01/19 182 lb (82.6 kg)  08/25/19 186 lb (84.4 kg)  05/21/19 180 lb 6.4 oz (81.8 kg)     HTN predates 1991. Her blood pressure has not been controlled at home, today their BP is BP: 120/76  She does not workout. She denies any cardiac symptoms, chest pains, palpitations, shortness of breath, dizziness or lower extremity edema.      She is on cholesterol medication Atorvastatin and denies myalgias.   Her cholesterol is not at goal. The cholesterol last visit was:   Lab Results  Component Value Date   CHOL 162 08/25/2019   HDL 41 (L) 08/25/2019   LDLCALC 88 08/25/2019   TRIG 250 (H) 08/25/2019   CHOLHDL 4.0 08/25/2019    She has not been working on diet and exercise for prediabetes (A1c 6.0%, 2014) and DMII (A1c 6.6%, 2017), and denies increased appetite, nausea, polydipsia, polyuria, visual disturbances and vomiting. Last A1C in the office was:  Lab Results  Component Value Date   HGBA1C 5.9 (H) 08/25/2019     Patient does have history of CKD.  Their last GFR was:  Lab Results  Component Value Date   GFRNONAA 23 (L) 08/25/2019   Lab Results  Component Value Date   GFRAA 27 (L) 08/25/2019     Patient is on Vitamin D supplement for  deficiency (34, 2008) Lab Results  Component Value Date   VD25OH 38 08/25/2019       Current Medications:  Current Outpatient Medications on File Prior to Visit  Medication Sig  . allopurinol  (ZYLOPRIM) 300 MG tablet TAKE 1 TABLET BY MOUTH EVERY DAY  . atenolol (TENORMIN) 50 MG tablet TAKE 1 TABLET DAILY FOR BLOOD PRESSURE  . atorvastatin (LIPITOR) 20 MG tablet Take 1 tablet Daily for Cholesterol  . BABY ASPIRIN PO Take 81 mg by mouth daily.  . diclofenac sodium (VOLTAREN) 1 % GEL Apply 4 g topically 4 (four) times daily as needed.  . furosemide (LASIX) 40 MG tablet TAKE 1 TABLET DAILY FOR BLOOD PRESSURE AND FLUID  . Multiple Vitamins-Minerals (ICAPS AREDS 2 PO) Take 1 capsule by mouth daily.  . Multiple Vitamins-Minerals (MULTIVITAMIN PO) Take by mouth daily.  . Omega-3 Fatty Acids (FISH OIL PO) Take by mouth daily.   No current facility-administered medications on file prior to visit.    Allergies:  Allergies  Allergen Reactions  . Ace Inhibitors     Pt unsure of reaction  . Feldene [Piroxicam] Rash      Medical History:  Past Medical History:  Diagnosis Date  . Cataract   . Chronic kidney disease   . DDD (degenerative disc disease), lumbar   . DJD (degenerative joint disease)   . GERD (gastroesophageal reflux disease)   . Hyperlipidemia   . Hypertension   . IBS (irritable bowel syndrome)   . Prediabetes      Family history- Reviewed and unchanged Family History  Problem Relation Age of Onset  . Cancer Mother        liver  . Liver cancer Mother   . Heart disease Father   . Heart attack Father   . Hypertension Sister   . Cancer Sister        kidney  . Colon cancer Neg Hx   . Breast cancer Neg Hx      Social history- Reviewed and unchanged Social History   Tobacco Use  . Smoking status: Former Smoker    Quit date: 06/16/2004    Years since quitting: 15.4  . Smokeless tobacco: Never Used  Substance Use Topics  . Alcohol use: No  . Drug use: No     Names of Other Physician/Practitioners you currently use: 1. [facility] here for primary care 2. Eye Exam: 04/2019 3. Dental Exam Q6months,  09/2019   Patient Care Team: Unk Pinto, MD as PCP - General (Internal Medicine) Gatha Mayer, MD as Consulting Physician (Gastroenterology) Macarthur Critchley, [place] as Referring Physician (Optometry) Elmarie Shiley, MD as Consulting Physician (Nephrology)    Screening Tests: Immunization History  Administered Date(s) Administered  . DT (Pediatric) 09/29/2014, 09/20/2015  . Influenza Split 09/28/2013  . Influenza, High Dose Seasonal PF 10/13/2014, 09/03/2016, 10/01/2017, 11/03/2018  . Influenza-Unspecified 08/04/2015  . Pneumococcal Conjugate-13 10/13/2014  . Pneumococcal Polysaccharide-23 09/23/2012  . Td 09/02/2004  . Zoster 10/13/2013     Vaccinations: TD or Tdap: 2016  Influenza: 10/2019 [facility] Pneumococcal: 2013 Prevnar13: 2015 Shingles: Zostavax/Shingrix: 2014   Preventative Care: Last colonoscopy: 2015 Last mammogram: 04/2019  DEXA:2019     Review of Systems:  Review of Systems  Constitutional: Negative for chills, diaphoresis, fever, malaise/fatigue and weight loss.  HENT: Negative for congestion, ear discharge, ear pain, hearing loss, nosebleeds, sinus pain, sore throat and tinnitus.   Eyes: Negative for blurred vision, double vision, photophobia, pain, discharge and redness.  Respiratory: Negative for cough, hemoptysis, sputum production, shortness of breath, wheezing and stridor.   Cardiovascular: Negative for chest pain, palpitations, orthopnea, claudication, leg swelling and PND.  Gastrointestinal: Negative for abdominal pain, blood in stool, constipation, diarrhea, heartburn, melena, nausea and vomiting.  Genitourinary: Negative for dysuria, flank pain, frequency, hematuria and urgency.  Musculoskeletal: Negative for back pain, falls, joint pain, myalgias and neck pain.  Skin: Negative for itching and rash.  Neurological: Positive for tingling. Negative for dizziness, tremors, sensory change, speech change, focal weakness, seizures, loss of  consciousness, weakness and headaches.       BLE Right worse than left, intermittent.  Endo/Heme/Allergies: Negative for environmental allergies and polydipsia. Does not bruise/bleed easily.  Psychiatric/Behavioral: Negative for depression, hallucinations, memory loss, substance abuse and suicidal ideas. The patient is not nervous/anxious and does not have insomnia.       Physical Exam: BP 120/76   Pulse 68   Temp (!) 97.3 F (36.3 C)   Wt 182 lb (82.6 kg)   SpO2 97%   BMI 32.24 kg/m  Wt Readings from Last 3 Encounters:  12/01/19 182 lb (82.6 kg)  08/25/19 186 lb (84.4 kg)  05/21/19 180 lb 6.4 oz (81.8 kg)   General Appearance: Well nourished, in no apparent distress. Eyes: PERRLA, EOMs, conjunctiva no swelling or erythema Sinuses: No Frontal/maxillary tenderness ENT/Mouth: Ext aud canals clear, TMs without erythema, bulging. Wearing a mask, no oral concerns for assessment. Hearing normal.  Neck: Supple, thyroid normal.  Respiratory: Respiratory effort normal, BS equal bilaterally without rales, rhonchi, wheezing or stridor.  Cardio: RRR with no MRGs. Brisk peripheral pulses without edema.  Abdomen: Soft, + BS.  Non tender, no guarding, rebound, hernias, masses. Lymphatics: Non tender without lymphadenopathy.  Musculoskeletal: Full ROM, 5/5 strength, Normal gait Skin: Warm, dry without rashes, lesions, ecchymosis.  Neuro: Cranial nerves intact. No cerebellar symptoms.  Psych: Awake and oriented X 3, normal affect, Insight and Judgment appropriate.    Garnet Sierras, NP [facility] 4:30 PM

## 2019-12-01 NOTE — Patient Instructions (Signed)
We will call you in 1-3 days with lab results.  We will forward these to nephrology office.  Try applying ice to your back when you have shooting leg/hip pains.  Monitor the tingling sensations.  If constant or bothersome please let us know.  You can try voltaren gel to your lower back.    Vit D  & Vit C 1,000 mg   are recommended to help protect  against the Covid-19 and other Corona viruses.    Also it's recommended  to take  Zinc 50 mg  to help  protect against the Covid-19   and best place to get  is also on [email or web address]  and don't pay more than 6-8 cents /pill !  ================================ Coronavirus (COVID-19) Are you at risk?  Are you at risk for the Coronavirus (COVID-19)?  To be considered HIGH RISK for Coronavirus (COVID-19), you have to meet the following criteria:  . Traveled to [place], [place], [place], [place] or [place]; or in the [place] to [place], [place], [place]  . or [place]; and have fever, cough, and shortness of breath within the last 2 weeks of travel OR . Been in close contact with a person diagnosed with COVID-19 within the last 2 weeks and have  . fever, cough,and shortness of breath .  . IF YOU DO NOT MEET THESE CRITERIA, YOU ARE CONSIDERED LOW RISK FOR COVID-19.  What to do if you are HIGH RISK for COVID-19?  Marland Kitchen If you are having a medical emergency, call 911. . Seek medical care right away. Before you go to a doctor's office, urgent care or emergency department, .  call ahead and tell them about your recent travel, contact with someone diagnosed with COVID-19  .  and your symptoms.  . You should receive instructions from your physician's office regarding next steps of care.  . When you arrive at healthcare provider, tell the healthcare staff immediately you have returned from  . visiting [place], [place], [place], [place] or [place]; or traveled in the [place] to [place], [place],  . [place] or [place] in the last  two weeks or you have been in close contact with a person diagnosed with  . COVID-19 in the last 2 weeks.   . Tell the health care staff about your symptoms: fever, cough and shortness of breath. . After you have been seen by a medical provider, you will be either: o Tested for (COVID-19) and discharged home on quarantine except to seek medical care if  o symptoms worsen, and asked to  - Stay home and avoid contact with others until you get your results (4-5 days)  - Avoid travel on public transportation if possible (such as bus, train, or airplane) or o Sent to the Emergency Department by EMS for evaluation, COVID-19 testing  and  o possible admission depending on your condition and test results.  What to do if you are LOW RISK for COVID-19?  Reduce your risk of any infection by using the same precautions used for avoiding the common cold or flu:  Marland Kitchen Wash your hands often with soap and warm water for at least 20 seconds.  If soap and water are not readily available,  . use an alcohol-based hand sanitizer with at least 60% alcohol.  . If coughing or sneezing, cover your mouth and nose by coughing or sneezing into the elbow areas of your shirt or coat, .  into a tissue or into your  sleeve (not your hands). . Avoid shaking hands with others and consider head nods or verbal greetings only. . Avoid touching your eyes, nose, or mouth with unwashed hands.  . Avoid close contact with people who are sick. . Avoid places or events with large numbers of people in one location, like concerts or sporting events. . Carefully consider travel plans you have or are making. . If you are planning any travel outside or inside the [place], visit the CDC's Travelers' Health webpage for the latest health notices. . If you have some symptoms but not all symptoms, continue to monitor at home and seek medical attention  . if your symptoms worsen. . If you are having a medical emergency, call  911.   . >>>>>>>>>>>>>>>>>>>>>>>>>>>>>>>>> . We Do NOT Approve of  [facility], [employer]  Our Patients  To Do Home Visits & We Do NOT Approve of [employer] > > > > > > > > > > > > > > > > > > > > > > > > > > > > > > > > > > > > > > >  Preventive Care for Adults  A healthy lifestyle and preventive care can promote health and wellness. Preventive health guidelines for women include the following key practices.  A routine yearly physical is a good way to check with your health care provider about your health and preventive screening. It is a chance to share any concerns and updates on your health and to receive a thorough exam.  Visit your dentist for a routine exam and preventive care every 6 months. Brush your teeth twice a day and floss once a day. Good oral hygiene prevents tooth decay and gum disease.  The frequency of eye exams is based on your age, health, family medical history, use of contact lenses, and other factors. Follow your health care provider's recommendations for frequency of eye exams.  Eat a healthy diet. Foods like vegetables, fruits, whole grains, low-fat dairy products, and lean protein foods contain the nutrients you need without too many calories. Decrease your intake of foods high in solid fats, added sugars, and salt. Eat the right amount of calories for you. Get information about a proper diet from your health care provider, if necessary.  Regular physical exercise is one of the most important things you can do for your health. Most adults should get at least 150 minutes of moderate-intensity exercise (any activity that increases your heart rate and causes you to sweat) each week. In addition, most adults need muscle-strengthening exercises on 2 or more days a week.  Maintain a healthy weight. The body mass index (BMI) is a screening tool to identify possible weight problems. It provides an estimate of body fat based on height and  weight. Your health care provider can find your BMI and can help you achieve or maintain a healthy weight. For adults 20 years and older:  A BMI below 18.5 is considered underweight.  A BMI of 18.5 to 24.9 is normal.  A BMI of 25 to 29.9 is considered overweight.  A BMI of 30 and above is considered obese.  Maintain normal blood lipids and cholesterol levels by exercising and minimizing your intake of saturated fat. Eat a balanced diet with plenty of fruit and vegetables. If your lipid or cholesterol levels are high, you are over 50, or you are at high risk for heart disease, you may need your cholesterol levels checked more frequently. Ongoing high lipid  and cholesterol levels should be treated with medicines if diet and exercise are not working.  If you smoke, find out from your health care provider how to quit. If you do not use tobacco, do not start.  Lung cancer screening is recommended for adults aged 28-80 years who are at high risk for developing lung cancer because of a history of smoking. A yearly low-dose CT scan of the lungs is recommended for people who have at least a 30-pack-year history of smoking and are a current smoker or have quit within the past 15 years. A pack year of smoking is smoking an average of 1 pack of cigarettes a day for 1 year (for example: 1 pack a day for 30 years or 2 packs a day for 15 years). Yearly screening should continue until the smoker has stopped smoking for at least 15 years. Yearly screening should be stopped for people who develop a health problem that would prevent them from having lung cancer treatment.  Avoid use of street drugs. Do not share needles with anyone. Ask for help if you need support or instructions about stopping the use of drugs.  High blood pressure causes heart disease and increases the risk of stroke.  Ongoing high blood pressure should be treated with medicines if weight loss and exercise do not work.  If you are 73-79 years  old, ask your health care provider if you should take aspirin to prevent strokes.  Diabetes screening involves taking a blood sample to check your fasting blood sugar level. This should be done once every 3 years, after age 75, if you are within normal weight and without risk factors for diabetes. Testing should be considered at a younger age or be carried out more frequently if you are overweight and have at least 1 risk factor for diabetes.  Breast cancer screening is essential preventive care for women. You should practice "breast self-awareness." This means understanding the normal appearance and feel of your breasts and may include breast self-examination. Any changes detected, no matter how small, should be reported to a health care provider. Women in their 41s and 30s should have a clinical breast exam (CBE) by a health care provider as part of a regular health exam every 1 to 3 years. After age 61, women should have a CBE every year. Starting at age 50, women should consider having a mammogram (breast X-ray test) every year. Women who have a family history of breast cancer should talk to their health care provider about genetic screening. Women at a high risk of breast cancer should talk to their health care providers about having an MRI and a mammogram every year.  Breast cancer gene (BRCA)-related cancer risk assessment is recommended for women who have family members with BRCA-related cancers. BRCA-related cancers include breast, ovarian, tubal, and peritoneal cancers. Having family members with these cancers may be associated with an increased risk for harmful changes (mutations) in the breast cancer genes BRCA1 and BRCA2. Results of the assessment will determine the need for genetic counseling and BRCA1 and BRCA2 testing.  Routine pelvic exams to screen for cancer are no longer recommended for nonpregnant women who are considered low risk for cancer of the pelvic organs (ovaries, uterus, and  vagina) and who do not have symptoms. Ask your health care provider if a screening pelvic exam is right for you.  If you have had past treatment for cervical cancer or a condition that could lead to cancer, you need Pap tests  and screening for cancer for at least 20 years after your treatment. If Pap tests have been discontinued, your risk factors (such as having a new sexual partner) need to be reassessed to determine if screening should be resumed. Some women have medical problems that increase the chance of getting cervical cancer. In these cases, your health care provider may recommend more frequent screening and Pap tests.    Colorectal cancer can be detected and often prevented. Most routine colorectal cancer screening begins at the age of 14 years and continues through age 23 years. However, your health care provider may recommend screening at an earlier age if you have risk factors for colon cancer. On a yearly basis, your health care provider may provide home test kits to check for hidden blood in the stool. Use of a small camera at the end of a tube, to directly examine the colon (sigmoidoscopy or colonoscopy), can detect the earliest forms of colorectal cancer. Talk to your health care provider about this at age 59, when routine screening begins.  Direct exam of the colon should be repeated every 5-10 years through age 20 years, unless early forms of pre-cancerous polyps or small growths are found.  Osteoporosis is a disease in which the bones lose minerals and strength with aging. This can result in serious bone fractures or breaks. The risk of osteoporosis can be identified using a bone density scan. Women ages 30 years and over and women at risk for fractures or osteoporosis should discuss screening with their health care providers. Ask your health care provider whether you should take a calcium supplement or vitamin D to reduce the rate of osteoporosis.  Menopause can be associated with  physical symptoms and risks. Hormone replacement therapy is available to decrease symptoms and risks. You should talk to your health care provider about whether hormone replacement therapy is right for you.  Use sunscreen. Apply sunscreen liberally and repeatedly throughout the day. You should seek shade when your shadow is shorter than you. Protect yourself by wearing long sleeves, pants, a wide-brimmed hat, and sunglasses year round, whenever you are outdoors.  Once a month, do a whole body skin exam, using a mirror to look at the skin on your back. Tell your health care provider of new moles, moles that have irregular borders, moles that are larger than a pencil eraser, or moles that have changed in shape or color.  Stay current with required vaccines (immunizations).  Influenza vaccine. All adults should be immunized every year.  Tetanus, diphtheria, and acellular pertussis (Td, Tdap) vaccine. Pregnant women should receive 1 dose of Tdap vaccine during each pregnancy. The dose should be obtained regardless of the length of time since the last dose. Immunization is preferred during the 27th-36th week of gestation. An adult who has not previously received Tdap or who does not know her vaccine status should receive 1 dose of Tdap. This initial dose should be followed by tetanus and diphtheria toxoids (Td) booster doses every 10 years. Adults with an unknown or incomplete history of completing a 3-dose immunization series with Td-containing vaccines should begin or complete a primary immunization series including a Tdap dose. Adults should receive a Td booster every 10 years.    Zoster vaccine. One dose is recommended for adults aged 74 years or older unless certain conditions are present.    Pneumococcal 13-valent conjugate (PCV13) vaccine. When indicated, a person who is uncertain of her immunization history and has no record of immunization should receive  the PCV13 vaccine. An adult aged 14  years or older who has certain medical conditions and has not been previously immunized should receive 1 dose of PCV13 vaccine. This PCV13 should be followed with a dose of pneumococcal polysaccharide (PPSV23) vaccine. The PPSV23 vaccine dose should be obtained at least 1 or more year(s) after the dose of PCV13 vaccine. An adult aged 50 years or older who has certain medical conditions and previously received 1 or more doses of PPSV23 vaccine should receive 1 dose of PCV13. The PCV13 vaccine dose should be obtained 1 or more years after the last PPSV23 vaccine dose.    Pneumococcal polysaccharide (PPSV23) vaccine. When PCV13 is also indicated, PCV13 should be obtained first. All adults aged 87 years and older should be immunized. An adult younger than age 51 years who has certain medical conditions should be immunized. Any person who resides in a nursing home or long-term care facility should be immunized. An adult smoker should be immunized. People with an immunocompromised condition and certain other conditions should receive both PCV13 and PPSV23 vaccines. People with human immunodeficiency virus (HIV) infection should be immunized as soon as possible after diagnosis. Immunization during chemotherapy or radiation therapy should be avoided. Routine use of PPSV23 vaccine is not recommended for American Indians, [place] Natives, or people younger than 65 years unless there are medical conditions that require PPSV23 vaccine. When indicated, people who have unknown immunization and have no record of immunization should receive PPSV23 vaccine. One-time revaccination 5 years after the first dose of PPSV23 is recommended for people aged 19-64 years who have chronic kidney failure, nephrotic syndrome, asplenia, or immunocompromised conditions. People who received 1-2 doses of PPSV23 before age 5 years should receive another dose of PPSV23 vaccine at age 42 years or later if at least 5 years have passed since the  previous dose. Doses of PPSV23 are not needed for people immunized with PPSV23 at or after age 74 years.   Preventive Services / Frequency  Ages 22 years and over  Blood pressure check.  Lipid and cholesterol check.  Lung cancer screening. / Every year if you are aged 47-80 years and have a 30-pack-year history of smoking and currently smoke or have quit within the past 15 years. Yearly screening is stopped once you have quit smoking for at least 15 years or develop a health problem that would prevent you from having lung cancer treatment.  Clinical breast exam.** / Every year after age 9 years.   BRCA-related cancer risk assessment.** / For women who have family members with a BRCA-related cancer (breast, ovarian, tubal, or peritoneal cancers).  Mammogram.** / Every year beginning at age 58 years and continuing for as long as you are in good health. Consult with your health care provider.  Pap test.** / Every 3 years starting at age 19 years through age 66 or 50 years with 3 consecutive normal Pap tests. Testing can be stopped between 65 and 70 years with 3 consecutive normal Pap tests and no abnormal Pap or HPV tests in the past 10 years.  Fecal occult blood test (FOBT) of stool. / Every year beginning at age 47 years and continuing until age 25 years. You may not need to do this test if you get a colonoscopy every 10 years.  Flexible sigmoidoscopy or colonoscopy.** / Every 5 years for a flexible sigmoidoscopy or every 10 years for a colonoscopy beginning at age 85 years and continuing until age 17 years.  Hepatitis C  blood test.** / For all people born from [birth year] through 1964 and any individual with known risks for hepatitis C.  Osteoporosis screening.** / A one-time screening for women ages 87 years and over and women at risk for fractures or osteoporosis.  Skin self-exam. / Monthly.  Influenza vaccine. / Every year.  Tetanus, diphtheria, and acellular pertussis (Tdap/Td)  vaccine.** / 1 dose of Td every 10 years.  Zoster vaccine.** / 1 dose for adults aged 74 years or older.  Pneumococcal 13-valent conjugate (PCV13) vaccine.** / Consult your health care provider.  Pneumococcal polysaccharide (PPSV23) vaccine.** / 1 dose for all adults aged 6 years and older. Screening for abdominal aortic aneurysm (AAA)  by ultrasound is recommended for people who have history of high blood pressure or who are current or former smokers. ++++++++++++++++++++ Recommend Adult Low Dose Aspirin or  coated  Aspirin 81 mg daily  To reduce risk of Colon Cancer 40 %,  Skin Cancer 26 % ,  Melanoma 46%  and  Pancreatic cancer 60% ++++++++++++++++++++ Vitamin D goal  is between 70-100.  Please make sure that you are taking your Vitamin D as directed.  It is very important as a natural anti-inflammatory  helping hair, skin, and nails, as well as reducing stroke and heart attack risk.  It helps your bones and helps with mood. It also decreases numerous cancer risks so please take it as directed.  Low Vit D is associated with a 200-300% higher risk for CANCER  and 200-300% higher risk for HEART   ATTACK  &  STROKE.   .....................................Marland Kitchen It is also associated with higher death rate at younger ages,  autoimmune diseases like Rheumatoid arthritis, Lupus, Multiple Sclerosis.    Also many other serious conditions, like depression, Alzheimer's Dementia, infertility, muscle aches, fatigue, fibromyalgia - just to name a few. ++++++++++++++++++ Recommend the book "The END of DIETING" by Dr Excell Seltzer  & the book "The END of DIABETES " by Dr Excell Seltzer At [email or web address] - get book & Audio CD's    Being diabetic has a  300% increased risk for heart attack, stroke, cancer, and alzheimer- type vascular dementia. It is very important that you work harder with diet by avoiding all foods that are white. Avoid white rice (brown & wild rice is OK), white potatoes (sweetpotatoes  in moderation is OK), White bread or wheat bread or anything made out of white flour like bagels, donuts, rolls, buns, biscuits, cakes, pastries, cookies, pizza crust, and pasta (made from white flour & egg whites) - vegetarian pasta or spinach or wheat pasta is OK. Multigrain breads like Arnold's or [employer], or multigrain sandwich thins or flatbreads.  Diet, exercise and weight loss can reverse and cure diabetes in the early stages.  Diet, exercise and weight loss is very important in the control and prevention of complications of diabetes which affects every system in your body, ie. Brain - dementia/stroke, eyes - glaucoma/blindness, heart - heart attack/heart failure, kidneys - dialysis, stomach - gastric paralysis, intestines - malabsorption, nerves - severe painful neuritis, circulation - gangrene & loss of a leg(s), and finally cancer and Alzheimers.    I recommend avoid fried & greasy foods,  sweets/candy, white rice (brown or wild rice or Quinoa is OK), white potatoes (sweet potatoes are OK) - anything made from white flour - bagels, doughnuts, rolls, buns, biscuits,white and wheat breads, pizza crust and traditional pasta made of white flour & egg white(vegetarian pasta or spinach or wheat pasta is  OK).  Multi-grain bread is OK - like multi-grain flat bread or sandwich thins. Avoid alcohol in excess. Exercise is also important.    Eat all the vegetables you want - avoid meat, especially red meat and dairy - especially cheese.  Cheese is the most concentrated form of trans-fats which is the worst thing to clog up our arteries. Veggie cheese is OK which can be found in the fresh produce section at [employer] or [employer] or [employer]  +++++++++++++++++++ DASH Eating Plan  DASH stands for "Dietary Approaches to Stop Hypertension."   The DASH eating plan is a healthy eating plan that has been shown to reduce high blood pressure (hypertension). Additional health benefits may include  reducing the risk of type 2 diabetes mellitus, heart disease, and stroke. The DASH eating plan may also help with weight loss. WHAT DO I NEED TO KNOW ABOUT THE DASH EATING PLAN? For the DASH eating plan, you will follow these general guidelines:  Choose foods with a percent daily value for sodium of less than 5% (as listed on the food label).  Use salt-free seasonings or herbs instead of table salt or sea salt.  Check with your health care provider or pharmacist before using salt substitutes.  Eat lower-sodium products, often labeled as "lower sodium" or "no salt added."  Eat fresh foods.  Eat more vegetables, fruits, and low-fat dairy products.  Choose whole grains. Look for the word "whole" as the first word in the ingredient list.  Choose fish   Limit sweets, desserts, sugars, and sugary drinks.  Choose heart-healthy fats.  Eat veggie cheese   Eat more home-cooked food and less restaurant, buffet, and fast food.  Limit fried foods.  Cook foods using methods other than frying.  Limit canned vegetables. If you do use them, rinse them well to decrease the sodium.  When eating at a restaurant, ask that your food be prepared with less salt, or no salt if possible.                      WHAT FOODS CAN I EAT? Read Dr Fara Olden Fuhrman's books on The End of Dieting & The End of Diabetes  Grains Whole grain or whole wheat bread. Brown rice. Whole grain or whole wheat pasta. Quinoa, bulgur, and whole grain cereals. Low-sodium cereals. Corn or whole wheat flour tortillas. Whole grain cornbread. Whole grain crackers. Low-sodium crackers.  Vegetables Fresh or frozen vegetables (raw, steamed, roasted, or grilled). Low-sodium or reduced-sodium tomato and vegetable juices. Low-sodium or reduced-sodium tomato sauce and paste. Low-sodium or reduced-sodium canned vegetables.   Fruits All fresh, canned (in natural juice), or frozen fruits.  Protein Products  All fish and seafood.  Dried  beans, peas, or lentils. Unsalted nuts and seeds. Unsalted canned beans.  Dairy Low-fat dairy products, such as skim or 1% milk, 2% or reduced-fat cheeses, low-fat ricotta or cottage cheese, or plain low-fat yogurt. Low-sodium or reduced-sodium cheeses.  Fats and Oils Tub margarines without trans fats. Light or reduced-fat mayonnaise and salad dressings (reduced sodium). Avocado. Safflower, olive, or canola oils. Natural peanut or almond butter.  Other Unsalted popcorn and pretzels. The items listed above may not be a complete list of recommended foods or beverages. Contact your dietitian for more options.  +++++++++++++++  WHAT FOODS ARE NOT RECOMMENDED? Grains/ White flour or wheat flour White bread. White pasta. White rice. Refined cornbread. Bagels and croissants. Crackers that contain trans fat.  Vegetables  Creamed or fried vegetables.  Vegetables in a . Regular canned vegetables. Regular canned tomato sauce and paste. Regular tomato and vegetable juices.  Fruits Dried fruits. Canned fruit in light or heavy syrup. Fruit juice.  Meat and Other Protein Products Meat in general - RED meat & White meat.  Fatty cuts of meat. Ribs, chicken wings, all processed meats as bacon, sausage, bologna, salami, fatback, hot dogs, bratwurst and packaged luncheon meats.  Dairy Whole or 2% milk, cream, half-and-half, and cream cheese. Whole-fat or sweetened yogurt. Full-fat cheeses or blue cheese. Non-dairy creamers and whipped toppings. Processed cheese, cheese spreads, or cheese curds.  Condiments Onion and garlic salt, seasoned salt, table salt, and sea salt. Canned and packaged gravies. Worcestershire sauce. Tartar sauce. Barbecue sauce. Teriyaki sauce. Soy sauce, including reduced sodium. Steak sauce. Fish sauce. Oyster sauce. Cocktail sauce. Horseradish. Ketchup and mustard. Meat flavorings and tenderizers. Bouillon cubes. Hot sauce. Tabasco sauce. Marinades. Taco seasonings.  Relishes.  Fats and Oils Butter, stick margarine, lard, shortening and bacon fat. Coconut, palm kernel, or palm oils. Regular salad dressings.  Pickles and olives. Salted popcorn and pretzels.  The items listed above may not be a complete list of foods and beverages to avoid.

## 2019-12-02 LAB — CBC WITH DIFFERENTIAL/PLATELET
Absolute Monocytes: 696 cells/uL (ref 200–950)
Basophils Absolute: 52 cells/uL (ref 0–200)
Basophils Relative: 0.6 %
Eosinophils Absolute: 165 cells/uL (ref 15–500)
Eosinophils Relative: 1.9 %
HCT: 34.8 % — ABNORMAL LOW (ref 35.0–45.0)
Hemoglobin: 11.5 g/dL — ABNORMAL LOW (ref 11.7–15.5)
Lymphs Abs: 2532 cells/uL (ref 850–3900)
MCH: 30 pg (ref 27.0–33.0)
MCHC: 33 g/dL (ref 32.0–36.0)
MCV: 90.9 fL (ref 80.0–100.0)
MPV: 11 fL (ref 7.5–12.5)
Monocytes Relative: 8 %
Neutro Abs: 5255 cells/uL (ref 1500–7800)
Neutrophils Relative %: 60.4 %
Platelets: 220 10*3/uL (ref 140–400)
RBC: 3.83 10*6/uL (ref 3.80–5.10)
RDW: 13.8 % (ref 11.0–15.0)
Total Lymphocyte: 29.1 %
WBC: 8.7 10*3/uL (ref 3.8–10.8)

## 2019-12-02 LAB — COMPLETE METABOLIC PANEL WITH GFR
AG Ratio: 1.8 (calc) (ref 1.0–2.5)
ALT: 15 U/L (ref 6–29)
AST: 20 U/L (ref 10–35)
Albumin: 3.9 g/dL (ref 3.6–5.1)
Alkaline phosphatase (APISO): 89 U/L (ref 37–153)
BUN/Creatinine Ratio: 23 (calc) — ABNORMAL HIGH (ref 6–22)
BUN: 41 mg/dL — ABNORMAL HIGH (ref 7–25)
CO2: 24 mmol/L (ref 20–32)
Calcium: 9.4 mg/dL (ref 8.6–10.4)
Chloride: 103 mmol/L (ref 98–110)
Creat: 1.81 mg/dL — ABNORMAL HIGH (ref 0.60–0.93)
GFR, Est African American: 31 mL/min/{1.73_m2} — ABNORMAL LOW (ref >=60)
GFR, Est Non African American: 27 mL/min/{1.73_m2} — ABNORMAL LOW (ref >=60)
Globulin: 2.2 g/dL (calc) (ref 1.9–3.7)
Glucose, Bld: 84 mg/dL (ref 65–99)
Potassium: 4.1 mmol/L (ref 3.5–5.3)
Sodium: 139 mmol/L (ref 135–146)
Total Bilirubin: 0.6 mg/dL (ref 0.2–1.2)
Total Protein: 6.1 g/dL (ref 6.1–8.1)

## 2019-12-02 LAB — HEMOGLOBIN A1C
Hgb A1c MFr Bld: 5.9 % of total Hgb — ABNORMAL HIGH (ref <5.7)
Mean Plasma Glucose: 123 (calc)
eAG (mmol/L): 6.8 (calc)

## 2019-12-02 LAB — LIPID PANEL
Cholesterol: 146 mg/dL (ref <200)
HDL: 43 mg/dL — ABNORMAL LOW (ref >=50)
LDL Cholesterol (Calc): 71 mg/dL (calc)
Non-HDL Cholesterol (Calc): 103 mg/dL (calc) (ref <130)
Total CHOL/HDL Ratio: 3.4 (calc) (ref <5.0)
Triglycerides: 218 mg/dL — ABNORMAL HIGH (ref <150)

## 2019-12-02 LAB — INSULIN, RANDOM: Insulin: 16.5 u[IU]/mL

## 2019-12-02 LAB — VITAMIN D 25 HYDROXY (VIT D DEFICIENCY, FRACTURES): Vit D, 25-Hydroxy: 41 ng/mL (ref 30–100)

## 2019-12-02 LAB — TSH: TSH: 1.66 mIU/L (ref 0.40–4.50)

## 2019-12-02 LAB — MAGNESIUM: Magnesium: 2.2 mg/dL (ref 1.5–2.5)

## 2019-12-03 ENCOUNTER — Other Ambulatory Visit: Payer: Self-pay | Admitting: Adult Health Nurse Practitioner

## 2019-12-03 DIAGNOSIS — E559 Vitamin D deficiency, unspecified: Secondary | ICD-10-CM

## 2019-12-03 MED ORDER — CHOLECALCIFEROL 1.25 MG (50000 UT) PO CAPS: ORAL_CAPSULE | ORAL | 0 refills | Status: DC

## 2019-12-14 ENCOUNTER — Other Ambulatory Visit: Payer: Self-pay

## 2019-12-14 DIAGNOSIS — K219 Gastro-esophageal reflux disease without esophagitis: Secondary | ICD-10-CM

## 2019-12-14 DIAGNOSIS — E559 Vitamin D deficiency, unspecified: Secondary | ICD-10-CM

## 2019-12-14 MED ORDER — FAMOTIDINE 20 MG PO TABS: ORAL_TABLET | ORAL | 2 refills | Status: DC

## 2019-12-14 MED ORDER — CHOLECALCIFEROL 1.25 MG (50000 UT) PO CAPS: ORAL_CAPSULE | ORAL | 0 refills | Status: DC

## 2019-12-31 ENCOUNTER — Ambulatory Visit: Payer: Medicare Other

## 2019-12-31 DIAGNOSIS — D631 Anemia in chronic kidney disease: Secondary | ICD-10-CM | POA: Diagnosis not present

## 2019-12-31 DIAGNOSIS — N2581 Secondary hyperparathyroidism of renal origin: Secondary | ICD-10-CM | POA: Diagnosis not present

## 2019-12-31 DIAGNOSIS — I129 Hypertensive chronic kidney disease with stage 1 through stage 4 chronic kidney disease, or unspecified chronic kidney disease: Secondary | ICD-10-CM | POA: Diagnosis not present

## 2019-12-31 DIAGNOSIS — N184 Chronic kidney disease, stage 4 (severe): Secondary | ICD-10-CM | POA: Diagnosis not present

## 2020-01-07 ENCOUNTER — Ambulatory Visit: Payer: Medicare Other | Attending: Internal Medicine

## 2020-01-07 DIAGNOSIS — Z23 Encounter for immunization: Secondary | ICD-10-CM | POA: Insufficient documentation

## 2020-01-07 NOTE — Progress Notes (Signed)
   Covid-19 Vaccination Clinic  Name:  Kelly Cantu    MRN: [id number] DOB: [birth year]  01/07/2020  [name] was observed post Covid-19 immunization for 15 minutes without incidence. She was provided with Vaccine Information Sheet and instruction to access the V-Safe system.   [name] was instructed to call 911 with any severe reactions post vaccine: Marland Kitchen Difficulty breathing  . Swelling of your face and throat  . A fast heartbeat  . A bad rash all over your body  . Dizziness and weakness    Immunizations Administered    Name Date Dose VIS Date Route   Pfizer COVID-19 Vaccine 01/07/2020 10:29 AM 0.3 mL 11/13/2019 Intramuscular   Manufacturer: [place]   Lot: [id number]   [place]: [id number]

## 2020-01-17 ENCOUNTER — Ambulatory Visit: Payer: Medicare Other

## 2020-02-01 ENCOUNTER — Ambulatory Visit: Payer: Medicare Other | Attending: Internal Medicine

## 2020-02-01 DIAGNOSIS — Z23 Encounter for immunization: Secondary | ICD-10-CM | POA: Insufficient documentation

## 2020-02-01 NOTE — Progress Notes (Signed)
   Covid-19 Vaccination Clinic  Name:  Akshita Italiano    MRN: [id number] DOB: [birth year]  02/01/2020  Ms. [place] was observed post Covid-19 immunization for 15 minutes without incidence. She was provided with Vaccine Information Sheet and instruction to access the V-Safe system.   Ms. Hehn was instructed to call 911 with any severe reactions post vaccine: Marland Kitchen Difficulty breathing  . Swelling of your face and throat  . A fast heartbeat  . A bad rash all over your body  . Dizziness and weakness    Immunizations Administered    Name Date Dose VIS Date Route   Pfizer COVID-19 Vaccine 02/01/2020  2:55 PM 0.3 mL 11/13/2019 Intramuscular   Manufacturer: [place]   Lot: [id number]   [place]: [id number]

## 2020-03-23 ENCOUNTER — Encounter: Payer: Self-pay | Admitting: Internal Medicine

## 2020-03-23 NOTE — Progress Notes (Signed)
Comprehensive Evaluation &  Examination     This very nice 79 y.o.female presents for a  comprehensive evaluation and management of multiple medical co-morbidities.  Patient has been followed for HTN, HLD,  T2_NIDDM and Vitamin D Deficiency. Patient has hx/o Gout controlled on her Allopurinol.      HTN predates since 51. Patient's BP has been controlled at home and patient denies any cardiac symptoms as chest pain, palpitations, shortness of breath, dizziness or ankle swelling. Today's BP was initially slightly elevated & rechecked at goal - 138/76.      Patient's hyperlipidemia is controlled with diet and medications. Patient denies myalgias or other medication SE's. Last lipids were at goal except elevated Trig's:  Lab Results  Component Value Date   CHOL 146 12/01/2019   HDL 43 (L) 12/01/2019   LDLCALC 71 12/01/2019   TRIG 218 (H) 12/01/2019   CHOLHDL 3.4 12/01/2019       Patient has hx/o prediabetes (A1c 6.0% / 2014), then  T2_NIDDM (A1c 6.6% / 2017)  which she controlling with diet. Patient does have HT & Diabetic CKD4 (GFR 27).  Patient denies reactive hypoglycemic symptoms, visual blurring, diabetic polys or paresthesias. Last A1c was not at goal:  Lab Results  Component Value Date   HGBA1C 5.9 (H) 12/01/2019       Finally, patient has history of Vitamin D Deficiency ("34" / 2008)  and last Vitamin D was still low:  Lab Results  Component Value Date   VD25OH 41 12/01/2019    Current Outpatient Medications on File Prior to Visit  Medication Sig  . allopurinol (ZYLOPRIM) 300 MG tablet TAKE 1 TABLET BY MOUTH EVERY DAY  . atenolol (TENORMIN) 50 MG tablet TAKE 1 TABLET DAILY FOR BLOOD PRESSURE  . atorvastatin (LIPITOR) 20 MG tablet Take 1 tablet Daily for Cholesterol  . BABY ASPIRIN PO Take 81 mg by mouth daily.  . Cholecalciferol (VITAMIN D) 50 MCG (2000 UT) CAPS Take 1 capsule by mouth daily.  . diclofenac sodium (VOLTAREN) 1 % GEL Apply 4 g topically 4 (four) times  daily as needed.  . famotidine (PEPCID) 20 MG tablet TAKE 1 TABLET BY MOUTH TWICE DAILY AS NEEDED FOR REFLUX  . furosemide (LASIX) 40 MG tablet TAKE 1 TABLET DAILY FOR BLOOD PRESSURE AND FLUID  . Multiple Vitamins-Minerals (ICAPS AREDS 2 PO) Take 1 capsule by mouth daily.  . Multiple Vitamins-Minerals (MULTIVITAMIN PO) Take by mouth daily.  . Omega-3 Fatty Acids (FISH OIL PO) Take by mouth daily.   No current facility-administered medications on file prior to visit.   Allergies  Allergen Reactions  . Ace Inhibitors     Pt unsure of reaction  . Feldene [Piroxicam] Rash   Past Medical History:  Diagnosis Date  . Cataract   . Chronic kidney disease   . DDD (degenerative disc disease), lumbar   . DJD (degenerative joint disease)   . GERD (gastroesophageal reflux disease)   . Hyperlipidemia   . Hypertension   . IBS (irritable bowel syndrome)   . Prediabetes    Health Maintenance  Topic Date Due  . OPHTHALMOLOGY EXAM  09/25/2017  . URINE MICROALBUMIN  02/11/2020  . HEMOGLOBIN A1C  05/31/2020  . INFLUENZA VACCINE  07/03/2020  . FOOT EXAM  03/23/2021  . TETANUS/TDAP  09/19/2025  . DEXA SCAN  Completed  . COVID-19 Vaccine  Completed  . PNA vac Low Risk Adult  Completed   Immunization History  Administered Date(s) Administered  . DT (Pediatric)  09/29/2014, 09/20/2015  . Influenza Split 09/28/2013  . Influenza, High Dose Seasonal PF 10/13/2014, 09/03/2016, 10/01/2017, 11/03/2018  . Influenza-Unspecified 08/04/2015  . PFIZER SARS-COV-2 Vaccination 01/07/2020, 02/01/2020  . Pneumococcal Conjugate-13 10/13/2014  . Pneumococcal Polysaccharide-23 09/23/2012  . Td 09/02/2004  . Zoster 10/13/2013    Last Colon - 08/30/2014 - Dr Carlean Purl recc no repeat due to age   Last MGM - 04/22/2019   Past Surgical History:  Procedure Laterality Date  . ABDOMINAL HYSTERECTOMY    . CATARACT EXTRACTION Bilateral   . COLONOSCOPY  x 2   w/Dr Carlean Purl   Family History  Problem Relation Age  of Onset  . Cancer Mother        liver  . Liver cancer Mother   . Heart disease Father   . Heart attack Father   . Hypertension Sister   . Cancer Sister        kidney  . Colon cancer Neg Hx   . Breast cancer Neg Hx    Social History   Tobacco Use  . Smoking status: Former Smoker    Quit date: 06/16/2004    Years since quitting: 15.7  . Smokeless tobacco: Never Used  Substance Use Topics  . Alcohol use: No  . Drug use: No    ROS Constitutional: Denies fever, chills, weight loss/gain, headaches, insomnia,  night sweats, and change in appetite. Does c/o fatigue. Eyes: Denies redness, blurred vision, diplopia, discharge, itchy, watery eyes.  ENT: Denies discharge, congestion, post nasal drip, epistaxis, sore throat, earache, hearing loss, dental pain, Tinnitus, Vertigo, Sinus pain, snoring.  Cardio: Denies chest pain, palpitations, irregular heartbeat, syncope, dyspnea, diaphoresis, orthopnea, PND, claudication, edema Respiratory: denies cough, dyspnea, DOE, pleurisy, hoarseness, laryngitis, wheezing.  Gastrointestinal: Denies dysphagia, heartburn, reflux, water brash, pain, cramps, nausea, vomiting, bloating, diarrhea, constipation, hematemesis, melena, hematochezia, jaundice, hemorrhoids Genitourinary: Denies dysuria, frequency, urgency, nocturia, hesitancy, discharge, hematuria, flank pain Breast: Breast lumps, nipple discharge, bleeding.  Musculoskeletal: Denies arthralgia, myalgia, stiffness, Jt. Swelling, pain, limp, and strain/sprain. Denies falls. Skin: Denies puritis, rash, hives, warts, acne, eczema, changing in skin lesion Neuro: No weakness, tremor, incoordination, spasms, paresthesia, pain Psychiatric: Denies confusion, memory loss, sensory loss. Denies Depression. Endocrine: Denies change in weight, skin, hair change, nocturia, and paresthesia, diabetic polys, visual blurring, hyper / hypo glycemic episodes.  Heme/Lymph: No excessive bleeding, bruising, enlarged lymph  nodes.  Physical Exam  BP 138/76   Pulse 60   Temp 97.9 F (36.6 C)   Resp 16   Ht 5' 2.5" (1.588 m)   Wt 178 lb 6.4 oz (80.9 kg)   BMI 32.11 kg/m   General Appearance: Well nourished, well groomed and in no apparent distress.  Eyes: PERRLA, EOMs, conjunctiva no swelling or erythema, normal fundi and vessels. Sinuses: No frontal/maxillary tenderness ENT/Mouth: EACs patent / TMs  nl. Nares clear without erythema, swelling, mucoid exudates. Oral hygiene is good. No erythema, swelling, or exudate. Tongue normal, non-obstructing. Tonsils not swollen or erythematous. Hearing normal.  Neck: Supple, thyroid not palpable. No bruits, nodes or JVD. Respiratory: Respiratory effort normal.  BS equal and clear bilateral without rales, rhonci, wheezing or stridor. Cardio: Heart sounds are normal with regular rate and rhythm and no murmurs, rubs or gallops. Peripheral pulses are normal and equal bilaterally without edema. No aortic or femoral bruits. Chest: symmetric with normal excursions and percussion. Breasts: Symmetric, without lumps, nipple discharge, retractions, or fibrocystic changes.  Abdomen: Flat, soft with bowel sounds active. Nontender, no guarding, rebound, hernias, masses, or  organomegaly.  Lymphatics: Non tender without lymphadenopathy.  Genitourinary:  Musculoskeletal: Full ROM all peripheral extremities, joint stability, 5/5 strength, and normal gait. Skin: Warm and dry without rashes, lesions, cyanosis, clubbing or  ecchymosis.  Neuro: Cranial nerves intact, reflexes equal bilaterally. Normal muscle tone, no cerebellar symptoms. Sensation intact.  Pysch: Alert and oriented X 3, normal affect, Insight and Judgment appropriate.   Assessment and Plan  1. Essential hypertension  - EKG 12-Lead - Urinalysis, Routine w reflex microscopic - Microalbumin / creatinine urine ratio - CBC with Differential/Platelet - COMPLETE METABOLIC PANEL WITH GFR - Magnesium - TSH  2.  Hyperlipidemia associated with type 2 diabetes mellitus ([place])  - EKG 12-Lead - Lipid panel - TSH  3. Type 2 diabetes mellitus with stage 4 chronic kidney disease,  without long-term current use of insulin (HCC)  - EKG 12-Lead - [facility] DIABETES FOOT EXAM - LOW EXTREMITY NEUR EXAM DOCUM - Hemoglobin A1c - Insulin, random  4. Vitamin D deficiency  - VITAMIN D 25 Hydroxy  5. Idiopathic gout  - Uric acid  6. CKD (chronic kidney disease) stage 4, GFR 15-29 ml/min ([facility]CC)  - Urinalysis, Routine w reflex microscopic - Microalbumin / creatinine urine ratio - PTH, intact and calcium  7. Secondary hyperparathyroidism of renal origin ([place])  - PTH, intact and calcium  8. Screening for colorectal cancer  - POC Hemoccult Bld/Stl  9. Screening for ischemic heart disease  - EKG 12-Lead  10. FHx: heart disease  - EKG 12-Lead  11. Former smoker  - EKG 12-Lead  12. Medication management  - Urinalysis, Routine w reflex microscopic - Microalbumin / creatinine urine ratio - Uric acid - CBC with Differential/Platelet - COMPLETE METABOLIC PANEL WITH GFR - Magnesium - Lipid panel - TSH - Hemoglobin A1c - Insulin, random - VITAMIN D 25 Hydroxy         Patient was counseled in prudent diet to achieve/maintain BMI less than 25 for weight control, BP monitoring, regular exercise and medications. Discussed med's effects and SE's. Screening labs and tests as requested with regular follow-up as recommended. Over 40 minutes of exam, counseling, chart review and high complex critical decision making was performed.   Kirtland Bouchard, MD

## 2020-03-23 NOTE — Patient Instructions (Signed)

## 2020-03-24 ENCOUNTER — Ambulatory Visit (INDEPENDENT_AMBULATORY_CARE_PROVIDER_SITE_OTHER): Payer: Medicare Other | Admitting: Internal Medicine

## 2020-03-24 ENCOUNTER — Encounter: Payer: Self-pay | Admitting: Internal Medicine

## 2020-03-24 ENCOUNTER — Other Ambulatory Visit: Payer: Self-pay

## 2020-03-24 VITALS — BP 138/76 | HR 60 | Temp 97.9°F | Resp 16 | Ht 62.5 in | Wt 178.4 lb

## 2020-03-24 DIAGNOSIS — E1169 Type 2 diabetes mellitus with other specified complication: Secondary | ICD-10-CM | POA: Diagnosis not present

## 2020-03-24 DIAGNOSIS — Z8249 Family history of ischemic heart disease and other diseases of the circulatory system: Secondary | ICD-10-CM

## 2020-03-24 DIAGNOSIS — Z87891 Personal history of nicotine dependence: Secondary | ICD-10-CM

## 2020-03-24 DIAGNOSIS — M1000 Idiopathic gout, unspecified site: Secondary | ICD-10-CM

## 2020-03-24 DIAGNOSIS — E1140 Type 2 diabetes mellitus with diabetic neuropathy, unspecified: Secondary | ICD-10-CM | POA: Diagnosis not present

## 2020-03-24 DIAGNOSIS — Z136 Encounter for screening for cardiovascular disorders: Secondary | ICD-10-CM

## 2020-03-24 DIAGNOSIS — E785 Hyperlipidemia, unspecified: Secondary | ICD-10-CM | POA: Diagnosis not present

## 2020-03-24 DIAGNOSIS — E559 Vitamin D deficiency, unspecified: Secondary | ICD-10-CM | POA: Diagnosis not present

## 2020-03-24 DIAGNOSIS — N184 Chronic kidney disease, stage 4 (severe): Secondary | ICD-10-CM | POA: Diagnosis not present

## 2020-03-24 DIAGNOSIS — Z1211 Encounter for screening for malignant neoplasm of colon: Secondary | ICD-10-CM | POA: Diagnosis not present

## 2020-03-24 DIAGNOSIS — N2581 Secondary hyperparathyroidism of renal origin: Secondary | ICD-10-CM | POA: Diagnosis not present

## 2020-03-24 DIAGNOSIS — Z1212 Encounter for screening for malignant neoplasm of rectum: Secondary | ICD-10-CM

## 2020-03-24 DIAGNOSIS — E1122 Type 2 diabetes mellitus with diabetic chronic kidney disease: Secondary | ICD-10-CM

## 2020-03-24 DIAGNOSIS — I10 Essential (primary) hypertension: Secondary | ICD-10-CM

## 2020-03-24 DIAGNOSIS — Z79899 Other long term (current) drug therapy: Secondary | ICD-10-CM | POA: Diagnosis not present

## 2020-03-24 MED ORDER — GABAPENTIN 100 MG PO CAPS: ORAL_CAPSULE | ORAL | 0 refills | Status: DC

## 2020-03-25 ENCOUNTER — Other Ambulatory Visit: Payer: Self-pay | Admitting: Internal Medicine

## 2020-03-25 DIAGNOSIS — Z1231 Encounter for screening mammogram for malignant neoplasm of breast: Secondary | ICD-10-CM

## 2020-03-25 LAB — COMPLETE METABOLIC PANEL WITH GFR
AG Ratio: 1.8 (calc) (ref 1.0–2.5)
ALT: 13 U/L (ref 6–29)
AST: 19 U/L (ref 10–35)
Albumin: 4.2 g/dL (ref 3.6–5.1)
Alkaline phosphatase (APISO): 97 U/L (ref 37–153)
BUN/Creatinine Ratio: 27 (calc) — ABNORMAL HIGH (ref 6–22)
BUN: 50 mg/dL — ABNORMAL HIGH (ref 7–25)
CO2: 23 mmol/L (ref 20–32)
Calcium: 10.1 mg/dL (ref 8.6–10.4)
Chloride: 102 mmol/L (ref 98–110)
Creat: 1.85 mg/dL — ABNORMAL HIGH (ref 0.60–0.93)
GFR, Est African American: 30 mL/min/{1.73_m2} — ABNORMAL LOW (ref >=60)
GFR, Est Non African American: 26 mL/min/{1.73_m2} — ABNORMAL LOW (ref >=60)
Globulin: 2.3 g/dL (calc) (ref 1.9–3.7)
Glucose, Bld: 87 mg/dL (ref 65–99)
Potassium: 4 mmol/L (ref 3.5–5.3)
Sodium: 137 mmol/L (ref 135–146)
Total Bilirubin: 0.7 mg/dL (ref 0.2–1.2)
Total Protein: 6.5 g/dL (ref 6.1–8.1)

## 2020-03-25 LAB — CBC WITH DIFFERENTIAL/PLATELET
Absolute Monocytes: 687 cells/uL (ref 200–950)
Basophils Absolute: 26 cells/uL (ref 0–200)
Basophils Relative: 0.3 %
Eosinophils Absolute: 191 cells/uL (ref 15–500)
Eosinophils Relative: 2.2 %
HCT: 36.4 % (ref 35.0–45.0)
Hemoglobin: 12.1 g/dL (ref 11.7–15.5)
Lymphs Abs: 2358 cells/uL (ref 850–3900)
MCH: 30.6 pg (ref 27.0–33.0)
MCHC: 33.2 g/dL (ref 32.0–36.0)
MCV: 92.2 fL (ref 80.0–100.0)
MPV: 11.2 fL (ref 7.5–12.5)
Monocytes Relative: 7.9 %
Neutro Abs: 5438 cells/uL (ref 1500–7800)
Neutrophils Relative %: 62.5 %
Platelets: 247 10*3/uL (ref 140–400)
RBC: 3.95 10*6/uL (ref 3.80–5.10)
RDW: 14 % (ref 11.0–15.0)
Total Lymphocyte: 27.1 %
WBC: 8.7 10*3/uL (ref 3.8–10.8)

## 2020-03-25 LAB — URIC ACID: Uric Acid, Serum: 3.7 mg/dL (ref 2.5–7.0)

## 2020-03-25 LAB — TSH: TSH: 1.47 mIU/L (ref 0.40–4.50)

## 2020-03-25 LAB — URINALYSIS, ROUTINE W REFLEX MICROSCOPIC
Bilirubin Urine: NEGATIVE
Glucose, UA: NEGATIVE
Hgb urine dipstick: NEGATIVE
Ketones, ur: NEGATIVE
Leukocytes,Ua: NEGATIVE
Nitrite: NEGATIVE
Protein, ur: NEGATIVE
Specific Gravity, Urine: 1.011 (ref 1.001–1.03)
pH: <=5 (ref 5.0–8.0)

## 2020-03-25 LAB — INSULIN, RANDOM: Insulin: 12.6 u[IU]/mL

## 2020-03-25 LAB — LIPID PANEL
Cholesterol: 156 mg/dL (ref <200)
HDL: 39 mg/dL — ABNORMAL LOW (ref >=50)
LDL Cholesterol (Calc): 88 mg/dL (calc)
Non-HDL Cholesterol (Calc): 117 mg/dL (calc) (ref <130)
Total CHOL/HDL Ratio: 4 (calc) (ref <5.0)
Triglycerides: 192 mg/dL — ABNORMAL HIGH (ref <150)

## 2020-03-25 LAB — MAGNESIUM: Magnesium: 2.4 mg/dL (ref 1.5–2.5)

## 2020-03-25 LAB — HEMOGLOBIN A1C
Hgb A1c MFr Bld: 5.8 % of total Hgb — ABNORMAL HIGH (ref <5.7)
Mean Plasma Glucose: 120 (calc)
eAG (mmol/L): 6.6 (calc)

## 2020-03-25 LAB — MICROALBUMIN / CREATININE URINE RATIO
Creatinine, Urine: 43 mg/dL (ref 20–275)
Microalb Creat Ratio: 7 mcg/mg creat (ref <30)
Microalb, Ur: 0.3 mg/dL

## 2020-03-25 LAB — PTH, INTACT AND CALCIUM
Calcium: 10.1 mg/dL (ref 8.6–10.4)
PTH: 18 pg/mL (ref 14–64)

## 2020-03-25 LAB — VITAMIN D 25 HYDROXY (VIT D DEFICIENCY, FRACTURES): Vit D, 25-Hydroxy: 48 ng/mL (ref 30–100)

## 2020-04-21 ENCOUNTER — Other Ambulatory Visit: Payer: Self-pay | Admitting: *Deleted

## 2020-04-21 DIAGNOSIS — Z1211 Encounter for screening for malignant neoplasm of colon: Secondary | ICD-10-CM | POA: Diagnosis not present

## 2020-04-21 DIAGNOSIS — Z1212 Encounter for screening for malignant neoplasm of rectum: Secondary | ICD-10-CM | POA: Diagnosis not present

## 2020-04-21 LAB — POC HEMOCCULT BLD/STL (HOME/3-CARD/SCREEN)
Card #2 Fecal Occult Blod, POC: NEGATIVE
Card #3 Fecal Occult Blood, POC: NEGATIVE
Fecal Occult Blood, POC: NEGATIVE

## 2020-04-22 ENCOUNTER — Other Ambulatory Visit: Payer: Self-pay

## 2020-04-22 ENCOUNTER — Ambulatory Visit
Admission: RE | Admit: 2020-04-22 | Discharge: 2020-04-22 | Disposition: A | Discharge status: Home / Self Care | Payer: Medicare Other | Source: Ambulatory Visit | Attending: Internal Medicine | Admitting: Internal Medicine

## 2020-04-22 DIAGNOSIS — Z1231 Encounter for screening mammogram for malignant neoplasm of breast: Secondary | ICD-10-CM

## 2020-04-25 DIAGNOSIS — N184 Chronic kidney disease, stage 4 (severe): Secondary | ICD-10-CM | POA: Diagnosis not present

## 2020-05-04 DIAGNOSIS — N184 Chronic kidney disease, stage 4 (severe): Secondary | ICD-10-CM | POA: Diagnosis not present

## 2020-05-04 DIAGNOSIS — I129 Hypertensive chronic kidney disease with stage 1 through stage 4 chronic kidney disease, or unspecified chronic kidney disease: Secondary | ICD-10-CM | POA: Diagnosis not present

## 2020-05-04 DIAGNOSIS — N2581 Secondary hyperparathyroidism of renal origin: Secondary | ICD-10-CM | POA: Diagnosis not present

## 2020-05-04 DIAGNOSIS — D631 Anemia in chronic kidney disease: Secondary | ICD-10-CM | POA: Diagnosis not present

## 2020-05-04 DIAGNOSIS — M109 Gout, unspecified: Secondary | ICD-10-CM | POA: Diagnosis not present

## 2020-05-25 ENCOUNTER — Ambulatory Visit: Payer: Medicare Other | Admitting: Adult Health

## 2020-05-30 ENCOUNTER — Other Ambulatory Visit: Payer: Self-pay | Admitting: Internal Medicine

## 2020-05-30 DIAGNOSIS — E1140 Type 2 diabetes mellitus with diabetic neuropathy, unspecified: Secondary | ICD-10-CM

## 2020-05-30 MED ORDER — GABAPENTIN 100 MG PO CAPS: ORAL_CAPSULE | ORAL | 1 refills | Status: DC

## 2020-06-01 ENCOUNTER — Other Ambulatory Visit: Payer: Self-pay | Admitting: Internal Medicine

## 2020-06-01 DIAGNOSIS — E1140 Type 2 diabetes mellitus with diabetic neuropathy, unspecified: Secondary | ICD-10-CM

## 2020-06-01 MED ORDER — GABAPENTIN 100 MG PO CAPS: ORAL_CAPSULE | ORAL | 1 refills | Status: DC

## 2020-06-25 ENCOUNTER — Other Ambulatory Visit: Payer: Self-pay | Admitting: Internal Medicine

## 2020-07-04 NOTE — Progress Notes (Signed)
MEDICARE ANNUAL WELLNESS VISIT AND FOLLOW UP  Assessment:   Diagnoses and all orders for this visit:  Encounter for Medicare annual wellness exam Increase exercise; check hep C screening; obtain diabetic eye exam tomorrow and forward report  Essential hypertension Continue medication Monitor blood pressure at home; call if consistently over 130/80 Continue DASH diet.   Reminder to go to the ER if any CP, SOB, nausea, dizziness, severe HA, changes vision/speech, left arm numbness and tingling and jaw pain.  Irritable bowel syndrome, unspecified type Symptoms stable with lifestyle modification  Gastroesophageal reflux disease, esophagitis presence not specified Well managed on current medications Discussed diet, avoiding triggers and other lifestyle changes  Primary osteoarthritis involving multiple joints Mild, improved after cortisone shots,  uses tylenol, gabapentin, voltaren PRN Continue follow up with ortho as needed  CKD (chronic kidney disease) stage 4, GFR 15-29 ml/min (HCC) Increase fluids, avoid NSAIDS, monitor sugars, will monitor Continue follow up with nephrology  Vitamin D deficiency Continue low dose as recommended by nephrology   Medication management CBC, CMP/GFR  Mixed hyperlipidemia Continue medications Continue low cholesterol diet and exercise.  Check lipid panel.   Idiopathic gout, unspecified chronicity, unspecified site Continue allopurinol Diet discussed Check uric acid as needed  Obesity (BMI 30.0-34.9) Long discussion about weight loss, diet, and exercise Recommended diet heavy in fruits and veggies and low in animal meats, cheeses, and dairy products, appropriate calorie intake Discussed appropriate weight for height Follow up at next visit  Lifestyle controlled T2DM with CKD 4 ([place]) Discussed disease and risks; managed by lifestyle Discussed diet/exercise, weight management  A1C  Secondary hyperparathyroidism of renal origin  ([place]) Off of calcium supplement; low dose vitamin D only; improved; monitoring only; nephrology is following  Over 40 minutes of exam, counseling, chart review and critical decision making was performed Future Appointments  Date Time Provider [place]  10/10/2020  2:30 PM Unk Pinto, MD [facility] None  03/30/2021  3:00 PM Unk Pinto, MD [facility] None     Plan:   During the course of the visit the patient was educated and counseled about appropriate screening and preventive services including:    Pneumococcal vaccine   Prevnar 13  Influenza vaccine  Td vaccine  Screening electrocardiogram  Bone densitometry screening  Colorectal cancer screening  Diabetes screening  Glaucoma screening  Nutrition counseling   Advanced directives: requested   Subjective:  Kelly Cantu is a 79 y.o. female who presents for Medicare Annual Wellness Visit and 3 month follow up.   Patient has hx/o GERD controlled by lifestyle and meds (famotidine).   She is followed by Dr. 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   She has some aching intermittently in feet and hands, takes tylenol, gabapentin 100 mg, voltaren topical with benefit.   BMI is Body mass index is 32.76 kg/m., she has been working on diet, trying to do better after gaining weight since covid 19 social distancing and exercise has been limited, wants to start walking a few days a week.   Wt Readings from Last 3 Encounters:  07/06/20 182 lb (82.6 kg)  03/24/20 178 lb 6.4 oz (80.9 kg)  12/01/19 182 lb (82.6 kg)    Her blood pressure has been controlled at home, today their BP is BP: 130/78 She does not workout. She denies chest pain, shortness of breath, dizziness.   She is on cholesterol medication (atorvastatin 20 mg daily)  and denies myalgias. Her LDL cholesterol is at goal; triglycerides remain elevated. The cholesterol  last visit was:   Lab Results  Component Value Date   CHOL 156 03/24/2020   HDL  39 (L) 03/24/2020   LDLCALC 88 03/24/2020   TRIG 192 (H) 03/24/2020   CHOLHDL 4.0 03/24/2020    She has been working on diet for lifestyle controlled T2DM (A1C 6.6% and 6.5% in 2017), and denies foot ulcerations, increased appetite, nausea, paresthesia of the feet, polydipsia, polyuria, visual disturbances, vomiting and weight loss. Last A1C in the office was:  Lab Results  Component Value Date   HGBA1C 5.8 (H) 03/24/2020   She has CKD 4 followed by nephrology Dr. Jimmy Footman (? Will be switching to Dr. Posey Pronto soon), also follows secondary hyperparathyroid of renal origin. Drinks 4+ bottles of water daily. Last GFR: Lab Results  Component Value Date   GFRNONAA 26 (L) 03/24/2020   Patient is on vitamin D3 2000 IU per nephrology instructions:  Lab Results  Component Value Date   VD25OH 48 03/24/2020     Patient is on allopurinol for gout and does not report a recent flare.  Lab Results  Component Value Date   LABURIC 3.7 03/24/2020     Medication Review: Current Outpatient Medications on File Prior to Visit  Medication Sig Dispense Refill  . allopurinol (ZYLOPRIM) 300 MG tablet TAKE 1 TABLET BY MOUTH EVERY DAY 90 tablet 99  . atenolol (TENORMIN) 50 MG tablet TAKE 1 TABLET DAILY FOR BLOOD PRESSURE 90 tablet 3  . atorvastatin (LIPITOR) 20 MG tablet Take 1 tablet Daily for Cholesterol 90 tablet 3  . BABY ASPIRIN PO Take 81 mg by mouth daily.    . Cholecalciferol (VITAMIN D) 50 MCG (2000 UT) CAPS Take 1 capsule by mouth daily.    . diclofenac sodium (VOLTAREN) 1 % GEL Apply 4 g topically 4 (four) times daily as needed. 500 g 6  . famotidine (PEPCID) 20 MG tablet TAKE 1 TABLET BY MOUTH TWICE DAILY AS NEEDED FOR REFLUX 180 tablet 2  . furosemide (LASIX) 40 MG tablet TAKE 1 TABLET DAILY FOR BLOOD PRESSURE AND FLUID 90 tablet 3  . gabapentin (NEURONTIN) 100 MG capsule Take 1 capsule 3 x /day as needed for Painful Peripheral Neuropathy 270 capsule 1  . Multiple Vitamins-Minerals (ICAPS AREDS  2 PO) Take 1 capsule by mouth daily.    . Multiple Vitamins-Minerals (MULTIVITAMIN PO) Take by mouth daily.    . Omega-3 Fatty Acids (FISH OIL PO) Take by mouth daily.     No current facility-administered medications on file prior to visit.    Allergies  Allergen Reactions  . Ace Inhibitors     Pt unsure of reaction  . Feldene [Piroxicam] Rash    Current Problems (verified) Patient Active Problem List   Diagnosis Date Noted  . Peripheral neuropathy 05/21/2019  . FHx: heart disease 07/31/2018  . Former smoker 07/31/2018  . Type 2 diabetes mellitus with stage 4 chronic kidney disease, without long-term current use of insulin ([place]) 07/31/2018  . Secondary hyperparathyroidism of renal origin ([place]) 04/10/2018  . Encounter for Medicare annual wellness exam 11/13/2015  . Obesity (BMI 30.0-34.9) 11/11/2015  . Idiopathic gout 04/11/2015  . Vitamin D deficiency 09/29/2014  . Medication management 09/29/2014  . CKD (chronic kidney disease) stage 4, GFR 15-29 ml/min (HCC) 07/22/2014  . Hyperlipidemia, mixed   . Essential hypertension   . GERD (gastroesophageal reflux disease)   . DJD (degenerative joint disease)   . DDD (degenerative disc disease), lumbar   . IBS (irritable bowel syndrome)  Screening Tests Immunization History  Administered Date(s) Administered  . DT (Pediatric) 09/29/2014, 09/20/2015  . Influenza Split 09/28/2013  . Influenza, High Dose Seasonal PF 10/13/2014, 09/03/2016, 10/01/2017, 11/03/2018  . Influenza-Unspecified 08/04/2015  . PFIZER SARS-COV-2 Vaccination 01/07/2020, 02/01/2020  . Pneumococcal Conjugate-13 10/13/2014  . Pneumococcal Polysaccharide-23 09/23/2012  . Td 09/02/2004  . Zoster 10/13/2013   Preventative care: Last colonoscopy: 2015, DONE due to age unless problems Last mammogram: 04/2020 Last pap smear/pelvic exam: remote, s/p abd hysterectomy   DEXA:2011, 07/2018 T 0.4 normal CXR 2015 US renal 2012  Prior vaccinations: TD or Tdap:  2016 Influenza: 2020  Pneumococcal: 2013 Prevnar13: 2015 Shingles/Zostavax: 2014 Covid 19: 2/2, 2021, pfizer  Names of Other Physician/Practitioners you currently use: 1. [facility] here for primary care 2. Dr. Nicki Reaper, eye doctor, last visit 2020, early MD, ? L, monitoring, has appointment tomorrow, requested send report 3. Randol Kern, dentist, last visit 2021 q6 months  Patient Care Team: Unk Pinto, MD as PCP - General (Internal Medicine) Gatha Mayer, MD as Consulting Physician (Gastroenterology) Macarthur Critchley, [place] as Referring Physician (Optometry) Elmarie Shiley, MD as Consulting Physician (Nephrology)  SURGICAL HISTORY She  has a past surgical history that includes Abdominal hysterectomy; Cataract extraction (Bilateral); and Colonoscopy (x 2). FAMILY HISTORY Her family history includes Cancer in her mother and sister; Heart attack in her father; Heart disease in her father; Hypertension in her sister; Liver cancer in her mother. SOCIAL HISTORY She  reports that she quit smoking about 16 years ago. Her smoking use included cigarettes. She started smoking about 61 years ago. She has a 14.85 pack-year smoking history. She has never used smokeless tobacco. She reports that she does not drink alcohol and does not use drugs.   MEDICARE WELLNESS OBJECTIVES: Physical activity: Current Exercise Habits: The patient does not participate in regular exercise at present, Exercise limited by: None identified Cardiac risk factors: Cardiac Risk Factors include: advanced age (>43men, >31 women);dyslipidemia;hypertension;obesity (BMI >30kg/m2);diabetes mellitus;sedentary lifestyle Depression/mood screen:   Depression screen [facility] 2/9 07/06/2020  Decreased Interest 0  Down, Depressed, Hopeless 0  PHQ - 2 Score 0    ADLs:  In your present state of health, do you have any difficulty performing the following activities: 07/06/2020 03/23/2020  Hearing? N N  Vision? N  N  Difficulty concentrating or making decisions? N N  Walking or climbing stairs? N N  Dressing or bathing? N N  Doing errands, shopping? N N  Some recent data might be hidden     Cognitive Testing  Alert? Yes  Normal Appearance?Yes  Oriented to person? Yes  Place? Yes   Time? Yes  Recall of three objects?  Yes  Can perform simple calculations? Yes  Displays appropriate judgment?Yes  Can read the correct time from a watch face?Yes  EOL planning: Does Patient Have a Medical Advance Directive?: Yes Type of Advance Directive: Healthcare Power of Attorney, Living will Does patient want to make changes to medical advance directive?: No - Patient declined Copy of [place] in Chart?: No - copy requested  Review of Systems  Constitutional: Negative for malaise/fatigue and weight loss.  HENT: Negative for hearing loss and tinnitus.   Eyes: Negative for blurred vision and double vision.  Respiratory: Negative for cough, sputum production, shortness of breath and wheezing.   Cardiovascular: Negative for chest pain, palpitations, orthopnea, claudication, leg swelling and PND.  Gastrointestinal: Negative for abdominal pain, blood in stool, constipation, diarrhea, heartburn, melena, nausea and  vomiting.  Genitourinary: Negative.   Musculoskeletal: Negative for falls, joint pain and myalgias.  Skin: Negative for rash.  Neurological: Negative for dizziness, tingling, sensory change, weakness and headaches.  Endo/Heme/Allergies: Negative for polydipsia.  Psychiatric/Behavioral: Negative.  Negative for depression, memory loss, substance abuse and suicidal ideas. The patient is not nervous/anxious and does not have insomnia.   All other systems reviewed and are negative.    Objective:     Today's Vitals   07/06/20 1408  BP: 130/78  Pulse: (!) 52  Temp: (!) 97.3 F (36.3 C)  SpO2: 98%  Weight: 182 lb (82.6 kg)   Body mass index is 32.76 kg/m.  General appearance:  alert, no distress, WD/WN, female HEENT: normocephalic, sclerae anicteric, TMs pearly, nares patent, no discharge or erythema, pharynx normal Oral cavity: MMM, no lesions Neck: supple, no lymphadenopathy, no thyromegaly, no masses Heart: RRR, normal S1, S2, no murmurs Lungs: CTA bilaterally, no wheezes, rhonchi, or rales Abdomen: +bs, soft, non tender, non distended, no masses, no hepatomegaly, no splenomegaly Musculoskeletal: nontender, no swelling, no obvious deformity Extremities: no edema, no cyanosis, no clubbing Pulses: 2+ symmetric, upper and lower extremities, normal cap refill Neurological: alert, oriented x 3, CN2-12 intact, strength normal upper extremities and lower extremities,, DTRs 2+ throughout, no cerebellar signs, gait normal, sensation intact Psychiatric: normal affect, behavior normal, pleasant   Medicare Attestation I have personally reviewed: The patient's medical and social history Their use of alcohol, tobacco or illicit drugs Their current medications and supplements The patient's functional ability including ADLs,fall risks, home safety risks, cognitive, and hearing and visual impairment Diet and physical activities Evidence for depression or mood disorders  The patient's weight, height, BMI, and visual acuity have been recorded in the chart.  I have made referrals, counseling, and provided education to the patient based on review of the above and I have provided the patient with a written personalized care plan for preventive services.     Izora Ribas, NP   07/06/2020

## 2020-07-06 ENCOUNTER — Other Ambulatory Visit: Payer: Self-pay

## 2020-07-06 ENCOUNTER — Encounter: Payer: Self-pay | Admitting: Adult Health

## 2020-07-06 ENCOUNTER — Ambulatory Visit (INDEPENDENT_AMBULATORY_CARE_PROVIDER_SITE_OTHER): Payer: Medicare Other | Admitting: Adult Health

## 2020-07-06 VITALS — BP 130/78 | HR 52 | Temp 97.3°F | Wt 182.0 lb

## 2020-07-06 DIAGNOSIS — Z87891 Personal history of nicotine dependence: Secondary | ICD-10-CM | POA: Diagnosis not present

## 2020-07-06 DIAGNOSIS — Z0001 Encounter for general adult medical examination with abnormal findings: Secondary | ICD-10-CM

## 2020-07-06 DIAGNOSIS — N184 Chronic kidney disease, stage 4 (severe): Secondary | ICD-10-CM

## 2020-07-06 DIAGNOSIS — R6889 Other general symptoms and signs: Secondary | ICD-10-CM

## 2020-07-06 DIAGNOSIS — N2581 Secondary hyperparathyroidism of renal origin: Secondary | ICD-10-CM

## 2020-07-06 DIAGNOSIS — Z9189 Other specified personal risk factors, not elsewhere classified: Secondary | ICD-10-CM

## 2020-07-06 DIAGNOSIS — G589 Mononeuropathy, unspecified: Secondary | ICD-10-CM

## 2020-07-06 DIAGNOSIS — D519 Vitamin B12 deficiency anemia, unspecified: Secondary | ICD-10-CM | POA: Diagnosis not present

## 2020-07-06 DIAGNOSIS — E669 Obesity, unspecified: Secondary | ICD-10-CM | POA: Diagnosis not present

## 2020-07-06 DIAGNOSIS — E559 Vitamin D deficiency, unspecified: Secondary | ICD-10-CM

## 2020-07-06 DIAGNOSIS — M1000 Idiopathic gout, unspecified site: Secondary | ICD-10-CM

## 2020-07-06 DIAGNOSIS — I10 Essential (primary) hypertension: Secondary | ICD-10-CM

## 2020-07-06 DIAGNOSIS — Z79899 Other long term (current) drug therapy: Secondary | ICD-10-CM

## 2020-07-06 DIAGNOSIS — E782 Mixed hyperlipidemia: Secondary | ICD-10-CM | POA: Diagnosis not present

## 2020-07-06 DIAGNOSIS — K589 Irritable bowel syndrome without diarrhea: Secondary | ICD-10-CM | POA: Diagnosis not present

## 2020-07-06 DIAGNOSIS — Z0000 Encounter for general adult medical examination without abnormal findings: Secondary | ICD-10-CM

## 2020-07-06 DIAGNOSIS — E1122 Type 2 diabetes mellitus with diabetic chronic kidney disease: Secondary | ICD-10-CM

## 2020-07-06 DIAGNOSIS — Z1159 Encounter for screening for other viral diseases: Secondary | ICD-10-CM

## 2020-07-06 DIAGNOSIS — K219 Gastro-esophageal reflux disease without esophagitis: Secondary | ICD-10-CM

## 2020-07-06 DIAGNOSIS — M8949 Other hypertrophic osteoarthropathy, multiple sites: Secondary | ICD-10-CM | POA: Diagnosis not present

## 2020-07-06 DIAGNOSIS — D649 Anemia, unspecified: Secondary | ICD-10-CM | POA: Diagnosis not present

## 2020-07-06 DIAGNOSIS — E538 Deficiency of other specified B group vitamins: Secondary | ICD-10-CM | POA: Diagnosis not present

## 2020-07-06 DIAGNOSIS — M159 Polyosteoarthritis, unspecified: Secondary | ICD-10-CM

## 2020-07-06 NOTE — Patient Instructions (Addendum)
Ms. Emert , Thank you for taking time to come for your Medicare Wellness Visit. I appreciate your ongoing commitment to your health goals. Please review the following plan we discussed and let me know if I can assist you in the future.   These are the goals we discussed: Goals    . Exercise 150 min/wk Moderate Activity     Start with 15-20 min a few days a week, slowly increase up    . Weight (lb) < 175 lb (79.4 kg)       This is a list of the screening recommended for you and due dates:  Health Maintenance  Topic Date Due  .  Hepatitis C: One time screening is recommended by Center for Disease Control  (CDC) for  adults born from [birth year] through 1964.   Never done  . Eye exam for diabetics  09/25/2017  . Flu Shot  07/03/2020  . Hemoglobin A1C  09/23/2020  . Complete foot exam   03/23/2021  . Urine Protein Check  03/24/2021  . Tetanus Vaccine  09/19/2025  . DEXA scan (bone density measurement)  Completed  . COVID-19 Vaccine  Completed  . Pneumonia vaccines  Completed    Please ask Dr. Nicki Reaper to send note - want to know if any retinopathy due to diabetes       Exercising to Stay Healthy To become healthy and stay healthy, it is recommended that you do moderate-intensity and vigorous-intensity exercise. You can tell that you are exercising at a moderate intensity if your heart starts beating faster and you start breathing faster but can still hold a conversation. You can tell that you are exercising at a vigorous intensity if you are breathing much harder and faster and cannot hold a conversation while exercising. Exercising regularly is important. It has many health benefits, such as:  Improving overall fitness, flexibility, and endurance.  Increasing bone density.  Helping with weight control.  Decreasing body fat.  Increasing muscle strength.  Reducing stress and tension.  Improving overall health. How often should I exercise? Choose an activity that you enjoy, and  set realistic goals. Your health care provider can help you make an activity plan that works for you. Exercise regularly as told by your health care provider. This may include:  Doing strength training two times a week, such as: ? Lifting weights. ? Using resistance bands. ? Push-ups. ? Sit-ups. ? Yoga.  Doing a certain intensity of exercise for a given amount of time. Choose from these options: ? A total of 150 minutes of moderate-intensity exercise every week. ? A total of 75 minutes of vigorous-intensity exercise every week. ? A mix of moderate-intensity and vigorous-intensity exercise every week. Children, pregnant women, people who have not exercised regularly, people who are overweight, and older adults may need to talk with a health care provider about what activities are safe to do. If you have a medical condition, be sure to talk with your health care provider before you start a new exercise program. What are some exercise ideas? Moderate-intensity exercise ideas include:  Walking 1 mile (1.6 km) in about 15 minutes.  Biking.  Hiking.  Golfing.  Dancing.  Water aerobics. Vigorous-intensity exercise ideas include:  Walking 4.5 miles (7.2 km) or more in about 1 hour.  Jogging or running 5 miles (8 km) in about 1 hour.  Biking 10 miles (16.1 km) or more in about 1 hour.  Lap swimming.  Roller-skating or in-line skating.  Cross-country skiing.  Vigorous competitive sports, such as football, basketball, and soccer.  Jumping rope.  Aerobic dancing. What are some everyday activities that can help me to get exercise?  [place] work, such as: ? Pushing a Conservation officer, nature. ? Raking and bagging leaves.  Washing your car.  Pushing a stroller.  Shoveling snow.  Gardening.  Washing windows or floors. How can I be more active in my day-to-day activities?  Use stairs instead of an elevator.  Take a walk during your lunch break.  If you drive, park your car farther  away from your work or school.  If you take public transportation, get off one stop early and walk the rest of the way.  Stand up or walk around during all of your indoor phone calls.  Get up, stretch, and walk around every 30 minutes throughout the day.  Enjoy exercise with a friend. Support to continue exercising will help you keep a regular routine of activity. What guidelines can I follow while exercising?  Before you start a new exercise program, talk with your health care provider.  Do not exercise so much that you hurt yourself, feel dizzy, or get very short of breath.  Wear comfortable clothes and wear shoes with good support.  Drink plenty of water while you exercise to prevent dehydration or heat stroke.  Work out until your breathing and your heartbeat get faster. Where to find more information  U.S. Department of Health and Human Services: [employer].at  Centers for Disease Control and Prevention (CDC): http://www.wolf.info/ Summary  Exercising regularly is important. It will improve your overall fitness, flexibility, and endurance.  Regular exercise also will improve your overall health. It can help you control your weight, reduce stress, and improve your bone density.  Do not exercise so much that you hurt yourself, feel dizzy, or get very short of breath.  Before you start a new exercise program, talk with your health care provider. This information is not intended to replace advice given to you by your health care provider. Make sure you discuss any questions you have with your health care provider. Document Revised: 11/01/2017 Document Reviewed: 10/10/2017 Elsevier Patient Education  2020 [employer].

## 2020-07-07 ENCOUNTER — Other Ambulatory Visit: Payer: Self-pay | Admitting: Adult Health

## 2020-07-07 DIAGNOSIS — H353132 Nonexudative age-related macular degeneration, bilateral, intermediate dry stage: Secondary | ICD-10-CM | POA: Diagnosis not present

## 2020-07-07 LAB — COMPLETE METABOLIC PANEL WITH GFR
AG Ratio: 1.7 (calc) (ref 1.0–2.5)
ALT: 11 U/L (ref 6–29)
AST: 19 U/L (ref 10–35)
Albumin: 4.1 g/dL (ref 3.6–5.1)
Alkaline phosphatase (APISO): 99 U/L (ref 37–153)
BUN/Creatinine Ratio: 26 (calc) — ABNORMAL HIGH (ref 6–22)
BUN: 44 mg/dL — ABNORMAL HIGH (ref 7–25)
CO2: 28 mmol/L (ref 20–32)
Calcium: 9.7 mg/dL (ref 8.6–10.4)
Chloride: 105 mmol/L (ref 98–110)
Creat: 1.7 mg/dL — ABNORMAL HIGH (ref 0.60–0.93)
GFR, Est African American: 33 mL/min/{1.73_m2} — ABNORMAL LOW (ref >=60)
GFR, Est Non African American: 29 mL/min/{1.73_m2} — ABNORMAL LOW (ref >=60)
Globulin: 2.4 g/dL (calc) (ref 1.9–3.7)
Glucose, Bld: 92 mg/dL (ref 65–99)
Potassium: 4.8 mmol/L (ref 3.5–5.3)
Sodium: 140 mmol/L (ref 135–146)
Total Bilirubin: 0.9 mg/dL (ref 0.2–1.2)
Total Protein: 6.5 g/dL (ref 6.1–8.1)

## 2020-07-07 LAB — LIPID PANEL
Cholesterol: 174 mg/dL (ref <200)
HDL: 41 mg/dL — ABNORMAL LOW (ref >=50)
LDL Cholesterol (Calc): 101 mg/dL (calc) — ABNORMAL HIGH
Non-HDL Cholesterol (Calc): 133 mg/dL (calc) — ABNORMAL HIGH (ref <130)
Total CHOL/HDL Ratio: 4.2 (calc) (ref <5.0)
Triglycerides: 208 mg/dL — ABNORMAL HIGH (ref <150)

## 2020-07-07 LAB — CBC WITH DIFFERENTIAL/PLATELET
Absolute Monocytes: 638 cells/uL (ref 200–950)
Basophils Absolute: 34 cells/uL (ref 0–200)
Basophils Relative: 0.4 %
Eosinophils Absolute: 176 cells/uL (ref 15–500)
Eosinophils Relative: 2.1 %
HCT: 37.1 % (ref 35.0–45.0)
Hemoglobin: 12.1 g/dL (ref 11.7–15.5)
Lymphs Abs: 2209 cells/uL (ref 850–3900)
MCH: 30.1 pg (ref 27.0–33.0)
MCHC: 32.6 g/dL (ref 32.0–36.0)
MCV: 92.3 fL (ref 80.0–100.0)
MPV: 11.4 fL (ref 7.5–12.5)
Monocytes Relative: 7.6 %
Neutro Abs: 5342 cells/uL (ref 1500–7800)
Neutrophils Relative %: 63.6 %
Platelets: 241 10*3/uL (ref 140–400)
RBC: 4.02 10*6/uL (ref 3.80–5.10)
RDW: 13.6 % (ref 11.0–15.0)
Total Lymphocyte: 26.3 %
WBC: 8.4 10*3/uL (ref 3.8–10.8)

## 2020-07-07 LAB — HEMOGLOBIN A1C
Hgb A1c MFr Bld: 5.8 % of total Hgb — ABNORMAL HIGH (ref <5.7)
Mean Plasma Glucose: 120 (calc)
eAG (mmol/L): 6.6 (calc)

## 2020-07-07 LAB — HEPATITIS C ANTIBODY
Hepatitis C Ab: NONREACTIVE
SIGNAL TO CUT-OFF: 0 (ref <1.00)

## 2020-07-07 LAB — VITAMIN B12: Vitamin B-12: 828 pg/mL (ref 200–1100)

## 2020-07-07 LAB — MAGNESIUM: Magnesium: 2.2 mg/dL (ref 1.5–2.5)

## 2020-07-07 LAB — TSH: TSH: 1.62 mIU/L (ref 0.40–4.50)

## 2020-07-07 MED ORDER — ATORVASTATIN CALCIUM 40 MG PO TABS: ORAL_TABLET | ORAL | 3 refills | Status: DC

## 2020-07-28 ENCOUNTER — Other Ambulatory Visit: Payer: Self-pay

## 2020-07-28 ENCOUNTER — Encounter: Payer: Self-pay | Admitting: Physician Assistant

## 2020-07-28 ENCOUNTER — Ambulatory Visit (INDEPENDENT_AMBULATORY_CARE_PROVIDER_SITE_OTHER): Payer: Medicare Other | Admitting: Physician Assistant

## 2020-07-28 VITALS — BP 138/70 | HR 61 | Temp 97.3°F | Wt 184.0 lb

## 2020-07-28 DIAGNOSIS — M8949 Other hypertrophic osteoarthropathy, multiple sites: Secondary | ICD-10-CM

## 2020-07-28 DIAGNOSIS — N184 Chronic kidney disease, stage 4 (severe): Secondary | ICD-10-CM | POA: Diagnosis not present

## 2020-07-28 DIAGNOSIS — M5136 Other intervertebral disc degeneration, lumbar region: Secondary | ICD-10-CM

## 2020-07-28 DIAGNOSIS — M159 Polyosteoarthritis, unspecified: Secondary | ICD-10-CM

## 2020-07-28 MED ORDER — PREDNISONE 20 MG PO TABS: ORAL_TABLET | ORAL | 1 refills | Status: DC

## 2020-07-28 MED ORDER — CYCLOBENZAPRINE HCL 5 MG PO TABS: 5.0000 mg | ORAL_TABLET | Freq: Three times a day (TID) | ORAL | 0 refills | Status: DC | PRN

## 2020-07-28 NOTE — Progress Notes (Signed)
Subjective:    Patient ID: Kelly Cantu, female    DOB: October 08, 1941, 79 y.o.   MRN: [id number]  HPI 79 y.o. 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 Pain constant but it is better/she forgets about it when she is busy or she is moving, nothing makes it worse. Has been taking tylenol can help some.   She is on gabapentin 100mg  TID.  No fever, no chills.  No recent falls Patient denies fever, hematuria, incontinence, numbness, tingling, weakness and saddle anesthesia  No constipation, no diarrhea, no nausea, no urinary issues.     Lab Results  Component Value Date   GFRNONAA 29 (L) 07/06/2020     Blood pressure 138/70, pulse 61, temperature (!) 97.3 F (36.3 C), weight 184 lb (83.5 kg), SpO2 98 %.   She  has a past surgical history that includes Abdominal hysterectomy; Cataract extraction (Bilateral); and Colonoscopy (x 2).  Immunization History  Administered Date(s) Administered  . DT (Pediatric) 09/29/2014, 09/20/2015  . Influenza Split 09/28/2013  . Influenza, High Dose Seasonal PF 10/13/2014, 09/03/2016, 10/01/2017, 11/03/2018  . Influenza-Unspecified 08/04/2015  . PFIZER SARS-COV-2 Vaccination 01/07/2020, 02/01/2020  . Pneumococcal Conjugate-13 10/13/2014  . Pneumococcal Polysaccharide-23 09/23/2012  . Td 09/02/2004  . Zoster 10/13/2013    Medications   Current Outpatient Medications (Cardiovascular):  .  atenolol (TENORMIN) 50 MG tablet, TAKE 1 TABLET DAILY FOR BLOOD PRESSURE .  atorvastatin (LIPITOR) 40 MG tablet, Take 1 tablet Daily for Cholesterol .  furosemide (LASIX) 40 MG tablet, TAKE 1 TABLET DAILY FOR BLOOD PRESSURE AND FLUID   Current Outpatient Medications (Analgesics):  .  allopurinol (ZYLOPRIM) 300 MG tablet, TAKE 1 TABLET BY MOUTH EVERY DAY .  BABY ASPIRIN PO, Take 81 mg by mouth  daily.   Current Outpatient Medications (Other):  Marland Kitchen  Cholecalciferol (VITAMIN D) 50 MCG (2000 UT) CAPS, Take 1 capsule by mouth daily. .  diclofenac sodium (VOLTAREN) 1 % GEL, Apply 4 g topically 4 (four) times daily as needed. .  famotidine (PEPCID) 20 MG tablet, TAKE 1 TABLET BY MOUTH TWICE DAILY AS NEEDED FOR REFLUX .  gabapentin (NEURONTIN) 100 MG capsule, Take 1 capsule 3 x /day as needed for Painful Peripheral Neuropathy .  Multiple Vitamins-Minerals (ICAPS AREDS 2 PO), Take 1 capsule by mouth daily. .  Multiple Vitamins-Minerals (MULTIVITAMIN PO), Take by mouth daily. .  Omega-3 Fatty Acids (FISH OIL PO), Take by mouth daily.  Problem list She has Hyperlipidemia, mixed; Essential hypertension; GERD (gastroesophageal reflux disease); DJD (degenerative joint disease); DDD (degenerative disc disease), lumbar; IBS (irritable bowel syndrome); CKD (chronic kidney disease) stage 4, GFR 15-29 ml/min ([place]); Vitamin D deficiency; Medication management; Idiopathic gout; Obesity (BMI 30.0-34.9); Encounter for Medicare annual wellness exam; Secondary hyperparathyroidism of renal origin [facility]); FHx: heart disease; Former smoker; and Type 2 diabetes mellitus with stage 4 chronic kidney disease, without long-term current use of insulin ([place]) on their problem list.  Review of Systems  Constitutional: Negative.   HENT: Negative.   Respiratory: Negative.   Cardiovascular: Negative.   Gastrointestinal: Negative.   Genitourinary: Negative for decreased urine volume, dysuria, enuresis, flank pain, frequency, genital sores, menstrual problem, pelvic pain, urgency, vaginal bleeding, vaginal discharge and vaginal pain.  Musculoskeletal: Positive for back pain. Negative for arthralgias and gait problem.  Skin:  Negative.  Negative for rash.  Neurological: Negative.   Psychiatric/Behavioral: Negative.        Objective:   Physical Exam Constitutional:      Appearance: She is well-developed.  HENT:      Head: Normocephalic and atraumatic.  Eyes:     Conjunctiva/sclera: Conjunctivae normal.     Pupils: Pupils are equal, round, and reactive to light.  Cardiovascular:     Rate and Rhythm: Normal rate and regular rhythm.  Pulmonary:     Effort: Pulmonary effort is normal.     Breath sounds: Normal breath sounds.  Abdominal:     General: Bowel sounds are normal.     Palpations: Abdomen is soft.     Tenderness: There is no abdominal tenderness.  Musculoskeletal:     Cervical back: Normal range of motion and neck supple.     Comments: Patient is able to ambulate well. Gait is not  Antalgic. Straight leg raising with dorsiflexion negative bilaterally for radicular symptoms. Sensory exam in the legs are normal. Knee reflexes are normal Ankle reflexes are normal Strength is normal and symmetric in arms and legs. There is SI tenderness to palpation.  There isparaspinal muscle spasm.  There is not midline tenderness.  ROM of spine with  limited in all spheres due to pain.   Lymphadenopathy:     Cervical: No cervical adenopathy.  Skin:    General: Skin is warm and dry.     Findings: No rash.  Neurological:     Mental Status: She is alert and oriented to person, place, and time.     Deep Tendon Reflexes: Reflexes are normal and symmetric.       Assessment & Plan:   Kelly Cantu was seen today for leg pain and back pain.  DDD (degenerative disc disease), lumbar -     predniSONE (DELTASONE) 20 MG tablet; 1 tablet daily for 5 days. -     cyclobenzaprine (FLEXERIL) 5 MG tablet; Take 1 tablet (5 mg total) by mouth 3 (three) times daily as needed for muscle spasms. Suggest PT/ortho referral,  Patient declines at this time, leaving for the [place] Will call if any changes in symptoms or go to the ER if she gets worse  Primary osteoarthritis involving multiple joints  CKD (chronic kidney disease) stage 4, GFR 15-29 ml/min [facility])   s  Future Appointments  Date Time Provider [place]  10/10/2020   2:30 PM Unk Pinto, MD [facility] None  03/30/2021  3:00 PM Unk Pinto, MD [facility] None  07/12/2021  2:00 PM Liane Comber, NP [facility] None

## 2020-07-28 NOTE — Patient Instructions (Addendum)
Please take the prednisone as directed below, this is NOT an antibiotic so you do NOT have to finish it. You can take it for a few days and stop it if you are doing better.   Please take the prednisone to help decrease inflammation and therefore decrease symptoms.  Take it it with food to avoid GI upset.  If you are diabetic it will increase your sugars so decrease carbs and monitor your sugars closely.    BACK PAIN  Try the exercises and other information in the back care manual.   MAXIMUM AMOUNT OF TYLENOL IN A DAY  You can take tylenol (500mg ) or tylenol arthritis (650mg ) with the meloxicam/antiinflammatories. The max you can take of tylenol a day is 3000mg  daily, this is a max of 6 pills a day of the regular tyelnol (500mg ) or a max of 4 a day of the tylenol arthritis (650mg ) as long as no other medications you are taking contain tylenol.    You can take flexeril if needed at bedtime for muscle spasm. This can be taken up to every 8 hours, but causes sedation, so should not drive or operate heavy machinery while taking this medicine.   Go to the ER if you have any new weakness in your legs, have trouble controlling your urine or bowels, or have worsening pain.   If you are not better in 1-3 month we will refer you to ortho   Back pain Rehab Ask your health care provider which exercises are safe for you. Do exercises exactly as told by your health care provider and adjust them as directed. It is normal to feel mild stretching, pulling, tightness, or discomfort as you do these exercises, but you should stop right away if you feel sudden pain or your pain gets worse. Do not begin these exercises until told by your health care provider. Stretching and range of motion exercises These exercises warm up your muscles and joints and improve the movement and flexibility of your hips and your back. These exercises also help to relieve pain, numbness, and tingling. Exercise A: Sciatic nerve  glide 1. Sit in a chair with your head facing down toward your chest. Place your hands behind your back. Let your shoulders slump forward. 2. Slowly straighten one of your knees while you tilt your head back as if you are looking toward the ceiling. Only straighten your leg as far as you can without making your symptoms worse. 3. Hold for __________ seconds. 4. Slowly return to the starting position. 5. Repeat with your other leg. Repeat __________ times. Complete this exercise __________ times a day. Exercise B: Knee to chest with hip adduction and internal rotation  1. Lie on your back on a firm surface with both legs straight. 2. Bend one of your knees and move it up toward your chest until you feel a gentle stretch in your lower back and buttock. Then, move your knee toward the shoulder that is on the opposite side from your leg. ? Hold your leg in this position by holding onto the front of your knee. 3. Hold for __________ seconds. 4. Slowly return to the starting position. 5. Repeat with your other leg. Repeat __________ times. Complete this exercise __________ times a day. Exercise C: Prone extension on elbows  1. Lie on your abdomen on a firm surface. A bed may be too soft for this exercise. 2. Prop yourself up on your elbows. 3. Use your arms to help lift your chest  up until you feel a gentle stretch in your abdomen and your lower back. ? This will place some of your body weight on your elbows. If this is uncomfortable, try stacking pillows under your chest. ? Your hips should stay down, against the surface that you are lying on. Keep your hip and back muscles relaxed. 4. Hold for __________ seconds. 5. Slowly relax your upper body and return to the starting position. Repeat __________ times. Complete this exercise __________ times a day. Strengthening exercises These exercises build strength and endurance in your back. Endurance is the ability to use your muscles for a long time,  even after they get tired. Exercise D: Pelvic tilt 1. Lie on your back on a firm surface. Bend your knees and keep your feet flat. 2. Tense your abdominal muscles. Tip your pelvis up toward the ceiling and flatten your lower back into the floor. ? To help with this exercise, you may place a small towel under your lower back and try to push your back into the towel. 3. Hold for __________ seconds. 4. Let your muscles relax completely before you repeat this exercise. Repeat __________ times. Complete this exercise __________ times a day. Exercise E: Alternating arm and leg raises  1. Get on your hands and knees on a firm surface. If you are on a hard floor, you may want to use padding to cushion your knees, such as an exercise mat. 2. Line up your arms and legs. Your hands should be below your shoulders, and your knees should be below your hips. 3. Lift your left leg behind you. At the same time, raise your right arm and straighten it in front of you. ? Do not lift your leg higher than your hip. ? Do not lift your arm higher than your shoulder. ? Keep your abdominal and back muscles tight. ? Keep your hips facing the ground. ? Do not arch your back. ? Keep your balance carefully, and do not hold your breath. 4. Hold for __________ seconds. 5. Slowly return to the starting position and repeat with your right leg and your left arm. Repeat __________ times. Complete this exercise __________ times a day. Posture and body mechanics  Body mechanics refers to the movements and positions of your body while you do your daily activities. Posture is part of body mechanics. Good posture and healthy body mechanics can help to relieve stress in your body's tissues and joints. Good posture means that your spine is in its natural S-curve position (your spine is neutral), your shoulders are pulled back slightly, and your head is not tipped forward. The following are general guidelines for applying improved  posture and body mechanics to your everyday activities. Standing   When standing, keep your spine neutral and your feet about hip-width apart. Keep a slight bend in your knees. Your ears, shoulders, and hips should line up.  When you do a task in which you stand in one place for a long time, place one foot up on a stable object that is 2-4 inches (5-10 cm) high, such as a footstool. This helps keep your spine neutral. Sitting   When sitting, keep your spine neutral and keep your feet flat on the floor. Use a footrest, if necessary, and keep your thighs parallel to the floor. Avoid rounding your shoulders, and avoid tilting your head forward.  When working at a desk or a computer, keep your desk at a height where your hands are slightly lower than your  elbows. Slide your chair under your desk so you are close enough to maintain good posture.  When working at a computer, place your monitor at a height where you are looking straight ahead and you do not have to tilt your head forward or downward to look at the screen. Resting   When lying down and resting, avoid positions that are most painful for you.  If you have pain with activities such as sitting, bending, stooping, or squatting (flexion-based activities), lie in a position in which your body does not bend very much. For example, avoid curling up on your side with your arms and knees near your chest (fetal position).  If you have pain with activities such as standing for a long time or reaching with your arms (extension-based activities), lie with your spine in a neutral position and bend your knees slightly. Try the following positions: ? Lying on your side with a pillow between your knees. ? Lying on your back with a pillow under your knees. Lifting   When lifting objects, keep your feet at least shoulder-width apart and tighten your abdominal muscles.  Bend your knees and hips and keep your spine neutral. It is important to lift  using the strength of your legs, not your back. Do not lock your knees straight out.  Always ask for help to lift heavy or awkward objects. This information is not intended to replace advice given to you by your health care provider. Make sure you discuss any questions you have with your health care provider. Document Released: 11/19/2005 Document Revised: 07/26/2016 Document Reviewed: 08/05/2015 [employer]vier Interactive Patient Education  [employer].

## 2020-10-07 DIAGNOSIS — Z23 Encounter for immunization: Secondary | ICD-10-CM | POA: Diagnosis not present

## 2020-10-10 ENCOUNTER — Ambulatory Visit (INDEPENDENT_AMBULATORY_CARE_PROVIDER_SITE_OTHER): Payer: Medicare Other | Admitting: Adult Health Nurse Practitioner

## 2020-10-10 ENCOUNTER — Encounter: Payer: Self-pay | Admitting: Adult Health Nurse Practitioner

## 2020-10-10 ENCOUNTER — Other Ambulatory Visit: Payer: Self-pay

## 2020-10-10 VITALS — BP 120/76 | Temp 97.7°F | Ht 63.0 in | Wt 180.0 lb

## 2020-10-10 DIAGNOSIS — Z23 Encounter for immunization: Secondary | ICD-10-CM | POA: Diagnosis not present

## 2020-10-10 DIAGNOSIS — Z79899 Other long term (current) drug therapy: Secondary | ICD-10-CM

## 2020-10-10 DIAGNOSIS — K219 Gastro-esophageal reflux disease without esophagitis: Secondary | ICD-10-CM | POA: Diagnosis not present

## 2020-10-10 DIAGNOSIS — M1000 Idiopathic gout, unspecified site: Secondary | ICD-10-CM | POA: Diagnosis not present

## 2020-10-10 DIAGNOSIS — N2581 Secondary hyperparathyroidism of renal origin: Secondary | ICD-10-CM | POA: Diagnosis not present

## 2020-10-10 DIAGNOSIS — E66811 Obesity, class 1: Secondary | ICD-10-CM

## 2020-10-10 DIAGNOSIS — E1122 Type 2 diabetes mellitus with diabetic chronic kidney disease: Secondary | ICD-10-CM

## 2020-10-10 DIAGNOSIS — E1140 Type 2 diabetes mellitus with diabetic neuropathy, unspecified: Secondary | ICD-10-CM

## 2020-10-10 DIAGNOSIS — E669 Obesity, unspecified: Secondary | ICD-10-CM

## 2020-10-10 DIAGNOSIS — I10 Essential (primary) hypertension: Secondary | ICD-10-CM | POA: Diagnosis not present

## 2020-10-10 DIAGNOSIS — M8949 Other hypertrophic osteoarthropathy, multiple sites: Secondary | ICD-10-CM

## 2020-10-10 DIAGNOSIS — N184 Chronic kidney disease, stage 4 (severe): Secondary | ICD-10-CM

## 2020-10-10 DIAGNOSIS — E782 Mixed hyperlipidemia: Secondary | ICD-10-CM | POA: Diagnosis not present

## 2020-10-10 DIAGNOSIS — M159 Polyosteoarthritis, unspecified: Secondary | ICD-10-CM

## 2020-10-10 DIAGNOSIS — E559 Vitamin D deficiency, unspecified: Secondary | ICD-10-CM | POA: Diagnosis not present

## 2020-10-10 DIAGNOSIS — M150 Primary generalized (osteo)arthritis: Secondary | ICD-10-CM

## 2020-10-10 MED ORDER — GABAPENTIN 100 MG PO CAPS: ORAL_CAPSULE | ORAL | 1 refills | Status: DC

## 2020-10-10 NOTE — Progress Notes (Signed)
FOLLOW UP 6 MONTH  Assessment:   Diagnoses and all orders for this visit:  Essential hypertension Continue medication Atenolol 100mg  daily Monitor blood pressure at home; call if consistently over 130/80 Continue DASH diet.   Reminder to go to the ER if any CP, SOB, nausea, dizziness, severe HA, changes vision/speech, left arm numbness and tingling and jaw pain.   Vitamin D deficiency Continue low dose as recommended by nephrology  Vit D 2,000IU daily  Lifestyle controlled T2DM with CKD 4 ([place]) Discussed disease and risks; managed by lifestyle Discussed diet/exercise, weight management  A1C  Gastroesophageal reflux disease, esophagitis presence not specified Well managed on current medications: Pepsid 20mg  BID Discussed diet, avoiding triggers and other lifestyle changes  Hyperlipidemia Continue medications: atrovastatin 40mg  Discussed dietary and exercise modifications Low fat diet  Primary osteoarthritis involving multiple joints Mild, improved after cortisone shots,  uses tylenol, gabapentin, voltaren PRN Continue follow up with ortho as needed  CKD (chronic kidney disease) stage 4, GFR 15-29 ml/min (HCC) Increase fluids, avoid NSAIDS, monitor sugars, will monitor Continue follow up with nephrology  Idiopathic gout, unspecified chronicity, unspecified site Continue allopurinol 300mg  Diet discussed Check uric acid as needed  Obesity (BMI 30.0-34.9) Long discussion about weight loss, diet, and exercise Recommended diet heavy in fruits and veggies and low in animal meats, cheeses, and dairy products, appropriate calorie intake Discussed appropriate weight for height Follow up at next visit  Secondary hyperparathyroidism of renal origin ([place]) Off of calcium supplement; low dose vitamin D only; improved; monitoring only; nephrology is following  Medication management Continued   Further disposition pending results if labs check today. Discussed med's effects  and SE's.   Over 30 minutes of face to face interview, exam, counseling, chart review, and critical decision making was performed.    Future Appointments  Date Time Provider [place]  03/30/2021  3:00 PM Unk Pinto, MD [facility] None  07/12/2021  2:00 PM Liane Comber, NP [facility] None       Subjective:  Kelly Cantu is a 79 y.o. female who presents for 3 month follow up.   Patient has hx/o GERD controlled by lifestyle and meds (famotidine).   She is followed by Dr. 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   She has some aching intermittently in feet and hands, takes tylenol, gabapentin 100 mg, voltaren topical with benefit.   BMI is Body mass index is 31.89 kg/m., she has been working on diet, trying to do better after gaining weight since covid 19 social distancing and exercise has been limited, wants to start walking a few days a week.   Wt Readings from Last 3 Encounters:  10/10/20 180 lb (81.6 kg)  07/28/20 184 lb (83.5 kg)  07/06/20 182 lb (82.6 kg)    Her blood pressure has been controlled at home, today their BP is BP: 120/76 She does not workout. She denies chest pain, shortness of breath, dizziness.   She is on cholesterol medication (atorvastatin 20 mg daily)  and denies myalgias. Her LDL cholesterol is at goal; triglycerides remain elevated. The cholesterol last visit was:   Lab Results  Component Value Date   CHOL 174 07/06/2020   HDL 41 (L) 07/06/2020   LDLCALC 101 (H) 07/06/2020   TRIG 208 (H) 07/06/2020   CHOLHDL 4.2 07/06/2020    She has been working on diet for lifestyle controlled T2DM (A1C 6.6% and 6.5% in 2017), and denies foot ulcerations, increased appetite, nausea, paresthesia of the feet, polydipsia, polyuria, visual  disturbances, vomiting and weight loss. Last A1C in the office was:  Lab Results  Component Value Date   HGBA1C 5.8 (H) 07/06/2020   She has CKD 4 followed by nephrology Dr. Jimmy Footman (? Will be switching to Dr. Posey Pronto  soon), also follows secondary hyperparathyroid of renal origin. Drinks 4+ bottles of water daily. Last GFR: Lab Results  Component Value Date   GFRNONAA 29 (L) 07/06/2020   Patient is on vitamin D3 2000 IU per nephrology instructions:  Lab Results  Component Value Date   VD25OH 48 03/24/2020     Patient is on allopurinol for gout and does not report a recent flare.  Lab Results  Component Value Date   LABURIC 3.7 03/24/2020     Medication Review: Current Outpatient Medications on File Prior to Visit  Medication Sig Dispense Refill  . allopurinol (ZYLOPRIM) 300 MG tablet TAKE 1 TABLET BY MOUTH EVERY DAY 90 tablet 99  . atenolol (TENORMIN) 50 MG tablet TAKE 1 TABLET DAILY FOR BLOOD PRESSURE 90 tablet 3  . atorvastatin (LIPITOR) 40 MG tablet Take 1 tablet Daily for Cholesterol 90 tablet 3  . BABY ASPIRIN PO Take 81 mg by mouth daily.    . Cholecalciferol (VITAMIN D) 50 MCG (2000 UT) CAPS Take 1 capsule by mouth daily.    . diclofenac sodium (VOLTAREN) 1 % GEL Apply 4 g topically 4 (four) times daily as needed. 500 g 6  . famotidine (PEPCID) 20 MG tablet TAKE 1 TABLET BY MOUTH TWICE DAILY AS NEEDED FOR REFLUX 180 tablet 2  . furosemide (LASIX) 40 MG tablet TAKE 1 TABLET DAILY FOR BLOOD PRESSURE AND FLUID 90 tablet 3  . Multiple Vitamins-Minerals (ICAPS AREDS 2 PO) Take 1 capsule by mouth daily.    . Multiple Vitamins-Minerals (MULTIVITAMIN PO) Take by mouth daily.    . Omega-3 Fatty Acids (FISH OIL PO) Take by mouth daily.     No current facility-administered medications on file prior to visit.    Allergies  Allergen Reactions  . Ace Inhibitors     Pt unsure of reaction  . Feldene [Piroxicam] Rash    Current Problems (verified) Patient Active Problem List   Diagnosis Date Noted  . FHx: heart disease 07/31/2018  . Former smoker 07/31/2018  . Type 2 diabetes mellitus with stage 4 chronic kidney disease, without long-term current use of insulin ([place]) 07/31/2018  . Secondary  hyperparathyroidism of renal origin ([place]) 04/10/2018  . Encounter for Medicare annual wellness exam 11/13/2015  . Obesity (BMI 30.0-34.9) 11/11/2015  . Idiopathic gout 04/11/2015  . Vitamin D deficiency 09/29/2014  . Medication management 09/29/2014  . CKD (chronic kidney disease) stage 4, GFR 15-29 ml/min (HCC) 07/22/2014  . Hyperlipidemia, mixed   . Essential hypertension   . GERD (gastroesophageal reflux disease)   . DJD (degenerative joint disease)   . DDD (degenerative disc disease), lumbar   . IBS (irritable bowel syndrome)     Screening Tests Immunization History  Administered Date(s) Administered  . DT (Pediatric) 09/29/2014, 09/20/2015  . Influenza Split 09/28/2013  . Influenza, High Dose Seasonal PF 10/13/2014, 09/03/2016, 10/01/2017, 11/03/2018, 10/10/2020  . Influenza-Unspecified 08/04/2015  . PFIZER SARS-COV-2 Vaccination 01/07/2020, 02/01/2020  . Pneumococcal Conjugate-13 10/13/2014  . Pneumococcal Polysaccharide-23 09/23/2012  . Td 09/02/2004  . Zoster 10/13/2013   Preventative care: Last colonoscopy: 2015, DONE due to age unless problems Last mammogram: 04/2020 Last pap smear/pelvic exam: remote, s/p abd hysterectomy   DEXA:2011, 07/2018 T 0.4 normal CXR 2015 US renal 2012  Prior vaccinations: TD or Tdap: 2016 Influenza: 10/2020, Received today Pneumococcal: 2013 Prevnar13: 2015 Shingles/Zostavax: 2014 Covid 19: 2/2, 2021, pfizer  Names of Other Physician/Practitioners you currently use: 1. [facility] here for primary care 2. Dr. Nicki Reaper, eye doctor, last visit 2021 3. Randol Kern, dentist, last visit 2021 q6 months  Patient Care Team: Unk Pinto, MD as PCP - General (Internal Medicine) Gatha Mayer, MD as Consulting Physician (Gastroenterology) Macarthur Critchley, [place] as Referring Physician (Optometry) Elmarie Shiley, MD as Consulting Physician (Nephrology)  SURGICAL HISTORY She  has a past surgical history that  includes Abdominal hysterectomy; Cataract extraction (Bilateral); and Colonoscopy (x 2). FAMILY HISTORY Her family history includes Cancer in her mother and sister; Heart attack in her father; Heart disease in her father; Hypertension in her sister; Liver cancer in her mother. SOCIAL HISTORY She  reports that she quit smoking about 16 years ago. Her smoking use included cigarettes. She started smoking about 61 years ago. She has a 14.85 pack-year smoking history. She has never used smokeless tobacco. She reports that she does not drink alcohol and does not use drugs.    Review of Systems  Constitutional: Negative for malaise/fatigue and weight loss.  HENT: Negative for hearing loss and tinnitus.   Eyes: Negative for blurred vision and double vision.  Respiratory: Negative for cough, sputum production, shortness of breath and wheezing.   Cardiovascular: Negative for chest pain, palpitations, orthopnea, claudication, leg swelling and PND.  Gastrointestinal: Negative for abdominal pain, blood in stool, constipation, diarrhea, heartburn, melena, nausea and vomiting.  Genitourinary: Negative.   Musculoskeletal: Negative for falls, joint pain and myalgias.  Skin: Negative for rash.  Neurological: Negative for dizziness, tingling, sensory change, weakness and headaches.  Endo/Heme/Allergies: Negative for polydipsia.  Psychiatric/Behavioral: Negative.  Negative for depression, memory loss, substance abuse and suicidal ideas. The patient is not nervous/anxious and does not have insomnia.   All other systems reviewed and are negative.    Objective:     Today's Vitals   10/10/20 1431  BP: 120/76  Temp: 97.7 F (36.5 C)  Weight: 180 lb (81.6 kg)  Height: 5\' 3"  (1.6 m)   Body mass index is 31.89 kg/m.  General appearance: alert, no distress, WD/WN, female HEENT: normocephalic, sclerae anicteric, TMs pearly, nares patent, no discharge or erythema, pharynx normal Oral cavity: MMM, no  lesions Neck: supple, no lymphadenopathy, no thyromegaly, no masses Heart: RRR, normal S1, S2, no murmurs Lungs: CTA bilaterally, no wheezes, rhonchi, or rales Abdomen: +bs, soft, non tender, non distended, no masses, no hepatomegaly, no splenomegaly Musculoskeletal: nontender, no swelling, no obvious deformity Extremities: no edema, no cyanosis, no clubbing Pulses: 2+ symmetric, upper and lower extremities, normal cap refill Neurological: alert, oriented x 3, CN2-12 intact, strength normal upper extremities and lower extremities,, DTRs 2+ throughout, no cerebellar signs, gait normal, sensation intact Psychiatric: normal affect, behavior normal, pleasant     Garnet Sierras, Laqueta Jean, DNP [facility] 10/10/2020  3:58 PM

## 2020-10-10 NOTE — Patient Instructions (Addendum)
  We will call you with lab results.  Likely Cholesterol will improve with the dose increase.  To test if it is the Atorvastatin causing your leg pain, you can stop taking.  If pain stops then likely from this and we can treat cholesterol with something different.   Please call to let us know.   GENERAL HEALTH GOALS  Know what a healthy weight is for you (roughly BMI <25) and aim to maintain this  Aim for 7+ servings of fruits and vegetables daily  70-80+ fluid ounces of water or unsweet tea for healthy kidneys  Limit to max 1 drink of alcohol per day; avoid smoking/tobacco  Limit animal fats in diet for cholesterol and heart health - choose grass fed whenever available  Avoid highly processed foods, and foods high in saturated/trans fats  Aim for low stress - take time to unwind and care for your mental health  Aim for 150 min of moderate intensity exercise weekly for heart health, and weights twice weekly for bone health  Aim for 7-9 hours of sleep daily

## 2020-10-11 LAB — CBC WITH DIFFERENTIAL/PLATELET
Absolute Monocytes: 714 cells/uL (ref 200–950)
Basophils Absolute: 42 cells/uL (ref 0–200)
Basophils Relative: 0.5 %
Eosinophils Absolute: 274 cells/uL (ref 15–500)
Eosinophils Relative: 3.3 %
HCT: 34.5 % — ABNORMAL LOW (ref 35.0–45.0)
Hemoglobin: 11.6 g/dL — ABNORMAL LOW (ref 11.7–15.5)
Lymphs Abs: 2449 cells/uL (ref 850–3900)
MCH: 30.7 pg (ref 27.0–33.0)
MCHC: 33.6 g/dL (ref 32.0–36.0)
MCV: 91.3 fL (ref 80.0–100.0)
MPV: 11.5 fL (ref 7.5–12.5)
Monocytes Relative: 8.6 %
Neutro Abs: 4822 cells/uL (ref 1500–7800)
Neutrophils Relative %: 58.1 %
Platelets: 234 10*3/uL (ref 140–400)
RBC: 3.78 10*6/uL — ABNORMAL LOW (ref 3.80–5.10)
RDW: 13.8 % (ref 11.0–15.0)
Total Lymphocyte: 29.5 %
WBC: 8.3 10*3/uL (ref 3.8–10.8)

## 2020-10-11 LAB — VITAMIN D 25 HYDROXY (VIT D DEFICIENCY, FRACTURES): Vit D, 25-Hydroxy: 57 ng/mL (ref 30–100)

## 2020-10-11 LAB — COMPLETE METABOLIC PANEL WITH GFR
AG Ratio: 2 (calc) (ref 1.0–2.5)
ALT: 15 U/L (ref 6–29)
AST: 21 U/L (ref 10–35)
Albumin: 4.1 g/dL (ref 3.6–5.1)
Alkaline phosphatase (APISO): 83 U/L (ref 37–153)
BUN/Creatinine Ratio: 23 (calc) — ABNORMAL HIGH (ref 6–22)
BUN: 45 mg/dL — ABNORMAL HIGH (ref 7–25)
CO2: 25 mmol/L (ref 20–32)
Calcium: 9.5 mg/dL (ref 8.6–10.4)
Chloride: 104 mmol/L (ref 98–110)
Creat: 1.95 mg/dL — ABNORMAL HIGH (ref 0.60–0.93)
GFR, Est African American: 28 mL/min/{1.73_m2} — ABNORMAL LOW (ref >=60)
GFR, Est Non African American: 24 mL/min/{1.73_m2} — ABNORMAL LOW (ref >=60)
Globulin: 2.1 g/dL (calc) (ref 1.9–3.7)
Glucose, Bld: 88 mg/dL (ref 65–99)
Potassium: 4.6 mmol/L (ref 3.5–5.3)
Sodium: 139 mmol/L (ref 135–146)
Total Bilirubin: 0.7 mg/dL (ref 0.2–1.2)
Total Protein: 6.2 g/dL (ref 6.1–8.1)

## 2020-10-11 LAB — LIPID PANEL
Cholesterol: 139 mg/dL (ref <200)
HDL: 41 mg/dL — ABNORMAL LOW (ref >=50)
LDL Cholesterol (Calc): 73 mg/dL (calc)
Non-HDL Cholesterol (Calc): 98 mg/dL (calc) (ref <130)
Total CHOL/HDL Ratio: 3.4 (calc) (ref <5.0)
Triglycerides: 178 mg/dL — ABNORMAL HIGH (ref <150)

## 2020-10-12 NOTE — Progress Notes (Signed)
Patient is aware of lab results. -e welch

## 2020-11-30 DIAGNOSIS — N184 Chronic kidney disease, stage 4 (severe): Secondary | ICD-10-CM | POA: Diagnosis not present

## 2020-12-06 DIAGNOSIS — D631 Anemia in chronic kidney disease: Secondary | ICD-10-CM | POA: Diagnosis not present

## 2020-12-06 DIAGNOSIS — I129 Hypertensive chronic kidney disease with stage 1 through stage 4 chronic kidney disease, or unspecified chronic kidney disease: Secondary | ICD-10-CM | POA: Diagnosis not present

## 2020-12-06 DIAGNOSIS — N2581 Secondary hyperparathyroidism of renal origin: Secondary | ICD-10-CM | POA: Diagnosis not present

## 2020-12-06 DIAGNOSIS — N1832 Chronic kidney disease, stage 3b: Secondary | ICD-10-CM | POA: Diagnosis not present

## 2020-12-28 ENCOUNTER — Other Ambulatory Visit: Payer: Self-pay | Admitting: Adult Health

## 2020-12-28 DIAGNOSIS — K219 Gastro-esophageal reflux disease without esophagitis: Secondary | ICD-10-CM

## 2021-01-11 DIAGNOSIS — H353132 Nonexudative age-related macular degeneration, bilateral, intermediate dry stage: Secondary | ICD-10-CM | POA: Diagnosis not present

## 2021-03-20 ENCOUNTER — Other Ambulatory Visit: Payer: Self-pay | Admitting: Internal Medicine

## 2021-03-20 DIAGNOSIS — Z1231 Encounter for screening mammogram for malignant neoplasm of breast: Secondary | ICD-10-CM

## 2021-03-29 ENCOUNTER — Encounter: Payer: Self-pay | Admitting: Internal Medicine

## 2021-03-29 NOTE — Progress Notes (Incomplete)
Comprehensive Evaluation &  Examination   Future Appointments  Date Time Provider [place]  03/30/2021  3:00 PM Unk Pinto, MD [facility] None  07/12/2021  2:00 PM Liane Comber, NP [facility] None  04/03/2022  3:00 PM Unk Pinto, MD [facility] None        This very nice 80 y.o. [facility] presents for a  comprehensive evaluation and management of multiple medical co-morbidities.  Patient has been followed for HTN, HLD, T2_NIDDM and Vitamin D Deficiency. Patient's GERD is controlled on her medications.  Patient has hx/o Gout controlled on her Allopurinol.       HTN predates circa 1991 . Patient's BP has been controlled at home and patient denies any cardiac symptoms as chest pain, palpitations, shortness of breath, dizziness or ankle swelling. Today's         Patient's hyperlipidemia is controlled with diet and Atorvastatin. Patient denies myalgias or other medication SE's. Last lipids were at goal except elevated Trig's:  Lab Results  Component Value Date   CHOL 139 10/10/2020   HDL 41 (L) 10/10/2020   LDLCALC 73 10/10/2020   TRIG 178 (H) 10/10/2020   CHOLHDL 3.4 10/10/2020        Patient has hx/o prediabetes (A1c 6.0% /2014) then transitioning to T2_NIDDM(A1c 6.6% /2017)w/CKD4 (GFR 27) and which she controlling with diet.  Patient denies reactive hypoglycemic symptoms, visual blurring, diabetic polys or paresthesias. Last A1c was near goal:  Lab Results  Component Value Date   HGBA1C 5.8 (H) 07/06/2020        Finally, patient has history of Vitamin D Deficiency ("34" /2008) and last Vitamin D was sl low (goal 70-100):  Lab Results  Component Value Date   VD25OH 57 10/10/2020    Current Outpatient Medications on File Prior to Visit  Medication Sig  . allopurinol (ZYLOPRIM) 300 MG tablet TAKE 1 TABLET BY MOUTH EVERY DAY  . atenolol (TENORMIN) 50 MG tablet TAKE 1 TABLET DAILY FOR BLOOD PRESSURE  . atorvastatin (LIPITOR) 40 MG tablet Take  1 tablet Daily for Cholesterol  . BABY ASPIRIN PO Take 81 mg by mouth daily.  . Cholecalciferol (VITAMIN D) 50 MCG (2000 UT) CAPS Take 1 capsule by mouth daily.  . diclofenac sodium (VOLTAREN) 1 % GEL Apply 4 g topically 4 (four) times daily as needed.  . famotidine (PEPCID) 20 MG tablet TAKE 1 TABLET TWICE A DAY AS NEEDED FOR REFLUX  . furosemide (LASIX) 40 MG tablet TAKE 1 TABLET DAILY FOR BLOOD PRESSURE AND FLUID  . gabapentin (NEURONTIN) 100 MG capsule Take 1 capsule 2 x /day as needed for Painful Peripheral Neuropathy  . Multiple Vitamins-Minerals (ICAPS AREDS 2 PO) Take 1 capsule by mouth daily.  . Multiple Vitamins-Minerals (MULTIVITAMIN PO) Take by mouth daily.  . Omega-3 Fatty Acids (FISH OIL PO) Take by mouth daily.   No current facility-administered medications on file prior to visit.    Allergies  Allergen Reactions  . Ace Inhibitors     Pt unsure of reaction  . Feldene [Piroxicam] Rash    Past Medical History:  Diagnosis Date  . Cataract   . Chronic kidney disease   . DDD (degenerative disc disease), lumbar   . DJD (degenerative joint disease)   . GERD (gastroesophageal reflux disease)   . Hyperlipidemia   . Hypertension   . IBS (irritable bowel syndrome)   . Prediabetes     Health Maintenance  Topic Date Due  . OPHTHALMOLOGY EXAM  09/25/2017  . COVID-19  Vaccine (3 - Booster for Pfizer series) 08/03/2020  . HEMOGLOBIN A1C  01/06/2021  . FOOT EXAM  03/23/2021  . URINE MICROALBUMIN  03/24/2021  . INFLUENZA VACCINE  07/03/2021  . TETANUS/TDAP  09/19/2025  . DEXA SCAN  Completed  . Hepatitis C Screening  Completed  . PNA vac Low Risk Adult  Completed  . HPV VACCINES  Aged Out    Immunization History  Administered Date(s) Administered  . DT  09/29/2014, 09/20/2015  . Influenza Split 09/28/2013  . Influenza, High Dose  10/01/2017, 11/03/2018, 10/10/2020  . Influenza 08/04/2015  . PFIZER  SARS-COV-2 Vacc 01/07/2020, 02/01/2020  . Pneumococcal -13  10/13/2014  . Pneumococcal -23 09/23/2012  . Td 09/02/2004  . Zoster 10/13/2013   Last Colon -  08/30/2014 - Dr Carlean Purl recc no repeat due to age  Last MGM -  04/22/2020 and scheduled for 05/08/2021  Past Surgical History:  Procedure Laterality Date  . ABDOMINAL HYSTERECTOMY    . CATARACT EXTRACTION Bilateral   . COLONOSCOPY  x 2   w/Dr Carlean Purl    Family History  Problem Relation Age of Onset  . Cancer Mother        liver  . Liver cancer Mother   . Heart disease Father   . Heart attack Father   . Hypertension Sister   . Cancer Sister        kidney  . Colon cancer Neg Hx   . Breast cancer Neg Hx     Social History   Tobacco Use  . Smoking status: Former Smoker    Packs/day: 0.33    Years: 45.00    Pack years: 14.85    Types: Cigarettes    Start date: 54    Quit date: 06/16/2004    Years since quitting: 16.7  . Smokeless tobacco: Never Used  Substance Use Topics  . Alcohol use: No  . Drug use: No    ROS Constitutional: Denies fever, chills, weight loss/gain, headaches, insomnia,  night sweats, and change in appetite. Does c/o fatigue. Eyes: Denies redness, blurred vision, diplopia, discharge, itchy, watery eyes.  ENT: Denies discharge, congestion, post nasal drip, epistaxis, sore throat, earache, hearing loss, dental pain, Tinnitus, Vertigo, Sinus pain, snoring.  Cardio: Denies chest pain, palpitations, irregular heartbeat, syncope, dyspnea, diaphoresis, orthopnea, PND, claudication, edema Respiratory: denies cough, dyspnea, DOE, pleurisy, hoarseness, laryngitis, wheezing.  Gastrointestinal: Denies dysphagia, heartburn, reflux, water brash, pain, cramps, nausea, vomiting, bloating, diarrhea, constipation, hematemesis, melena, hematochezia, jaundice, hemorrhoids Genitourinary: Denies dysuria, frequency, urgency, nocturia, hesitancy, discharge, hematuria, flank pain Breast: Breast lumps, nipple discharge, bleeding.  Musculoskeletal: Denies arthralgia, myalgia,  stiffness, Jt. Swelling, pain, limp, and strain/sprain. Denies falls. Skin: Denies puritis, rash, hives, warts, acne, eczema, changing in skin lesion Neuro: No weakness, tremor, incoordination, spasms, paresthesia, pain Psychiatric: Denies confusion, memory loss, sensory loss. Denies Depression. Endocrine: Denies change in weight, skin, hair change, nocturia, and paresthesia, diabetic polys, visual blurring, hyper / hypo glycemic episodes.  Heme/Lymph: No excessive bleeding, bruising, enlarged lymph nodes.  Physical Exam  There were no vitals taken for this visit.  General Appearance: Well nourished, well groomed and in no apparent distress.  Eyes: PERRLA, EOMs, conjunctiva no swelling or erythema, normal fundi and vessels. Sinuses: No frontal/maxillary tenderness ENT/Mouth: EACs patent / TMs  nl. Nares clear without erythema, swelling, mucoid exudates. Oral hygiene is good. No erythema, swelling, or exudate. Tongue normal, non-obstructing. Tonsils not swollen or erythematous. Hearing normal.  Neck: Supple, thyroid not palpable. No bruits, nodes or  JVD. Respiratory: Respiratory effort normal.  BS equal and clear bilateral without rales, rhonci, wheezing or stridor. Cardio: Heart sounds are normal with regular rate and rhythm and no murmurs, rubs or gallops. Peripheral pulses are normal and equal bilaterally without edema. No aortic or femoral bruits. Chest: symmetric with normal excursions and percussion. Breasts: Symmetric, without lumps, nipple discharge, retractions, or fibrocystic changes.  Abdomen: Flat, soft with bowel sounds active. Nontender, no guarding, rebound, hernias, masses, or organomegaly.  Lymphatics: Non tender without lymphadenopathy.  Genitourinary:  Musculoskeletal: Full ROM all peripheral extremities, joint stability, 5/5 strength, and normal gait. Skin: Warm and dry without rashes, lesions, cyanosis, clubbing or  ecchymosis.  Neuro: Cranial nerves intact. DTR's flat.  Normal muscle tone, no cerebellar symptoms. Sensation decreased to touch, vibratory and Monofilament to the toes bilaterally.  Pysch: Alert and oriented X 3, normal affect, Insight and Judgment appropriate.   Assessment and Plan               Patient was counseled in prudent diet to achieve/maintain BMI less than 25 for weight control, BP monitoring, regular exercise and medications. Discussed med's effects and SE's. Screening labs and tests as requested with regular follow-up as recommended. Over 40 minutes of exam, counseling, chart review and high complex critical decision making was performed.   Kirtland Bouchard, MD

## 2021-03-29 NOTE — Patient Instructions (Signed)

## 2021-03-29 NOTE — Progress Notes (Signed)
Comprehensive Evaluation &  Examination   Future Appointments  Date Time Provider [place]  03/30/2021  3:00 PM Unk Pinto, MD [facility] None  07/12/2021  2:00 PM Liane Comber, NP [facility] None  04/03/2022  3:00 PM Unk Pinto, MD [facility] None        This very nice 80 y.o. [facility] presents for a  comprehensive evaluation and management of multiple medical co-morbidities.  Patient has been followed for HTN, HLD, T2_NIDDM and Vitamin D Deficiency. Patient's GERD is controlled on her medications.  Patient has hx/o Gout controlled on her Allopurinol.       HTN predates circa 1991 . Patient's BP has been controlled at home and patient denies any cardiac symptoms as chest pain, palpitations, shortness of breath, dizziness or ankle swelling. Today's BP is elevated at 184/79  checked with wrist & arm cuff x 3.       Patient's hyperlipidemia is controlled with diet and Atorvastatin. Patient denies myalgias or other medication SE's. Last lipids were at goal except elevated Trig's:  Lab Results  Component Value Date   CHOL 139 10/10/2020   HDL 41 (L) 10/10/2020   LDLCALC 73 10/10/2020   TRIG 178 (H) 10/10/2020   CHOLHDL 3.4 10/10/2020        Patient has hx/o prediabetes (A1c 6.0% /2014) then transitioning to T2_NIDDM(A1c 6.6% /2017)w/CKD4 (GFR 27)  and which she is now controlling with diet.  She is followed by Dr Elmarie Shiley for Nephrology. Patient denies reactive hypoglycemic symptoms, visual blurring or diabetic polys, but is on Gabapentin for painful Diabetic Neuropathy. Last A1c was near goal:  Lab Results  Component Value Date   HGBA1C 5.8 (H) 07/06/2020       Finally, patient has history of Vitamin D Deficiency ("34" /2008) and last Vitamin D was sl low (goal 70-100):  Lab Results  Component Value Date   VD25OH 57 10/10/2020    Current Outpatient Medications on File Prior to Visit  Medication Sig  . allopurinol 300 MG tablet TAKE 1  TABLET  EVERY DAY  . atenolol  50 MG tablet TAKE 1 TABLET DAILY FOR BLOOD PRESSURE  . atorvastatin 40 MG tablet Take 1 tablet Daily for Cholesterol  . BABY ASPIRIN PO Take  daily.  Marland Kitchen VITAMIN D  2000 u Take 1 capsule  daily.  . diclofenac  1 % GEL Apply 4 g topically 4 (four) times daily as needed.  . famotidine20 MG tablet TAKE 1 TABLET TWICE A DAY AS NEEDED   . furosemide  40 MG tablet TAKE 1 TABLET DAILY  . ICAPS AREDS 2  Take 1 capsule  daily.  . Multiple Vitamins-Minerals  Take  daily.  . Omega-3  FISH OIL Take  daily.    Allergies  Allergen Reactions  . Ace Inhibitors     Pt unsure of reaction  . Feldene [Piroxicam] Rash    Past Medical History:  Diagnosis Date  . Cataract   . Chronic kidney disease   . DDD (degenerative disc disease), lumbar   . DJD (degenerative joint disease)   . GERD (gastroesophageal reflux disease)   . Hyperlipidemia   . Hypertension   . IBS (irritable bowel syndrome)   . Prediabetes     Health Maintenance  Topic Date Due  . OPHTHALMOLOGY EXAM  09/25/2017  . COVID-19 Vaccine (3 - Booster for Pfizer series) 08/03/2020  . HEMOGLOBIN A1C  01/06/2021  . FOOT EXAM  03/23/2021  . URINE MICROALBUMIN  03/24/2021  .  INFLUENZA VACCINE  07/03/2021  . TETANUS/TDAP  09/19/2025  . DEXA SCAN  Completed  . Hepatitis C Screening  Completed  . PNA vac Low Risk Adult  Completed  . HPV VACCINES  Aged Out    Immunization History  Administered Date(s) Administered  . DT  09/29/2014, 09/20/2015  . Influenza Split 09/28/2013  . Influenza, High Dose  10/01/2017, 11/03/2018, 10/10/2020  . Influenza 08/04/2015  . PFIZER  SARS-COV-2 Vacc 01/07/2020, 02/01/2020  . Pneumococcal -13 10/13/2014  . Pneumococcal -23 09/23/2012  . Td 09/02/2004  . Zoster 10/13/2013   Last Colon -  08/30/2014 - Dr Carlean Purl recc no repeat due to age  Last MGM -  04/22/2020 and scheduled for 05/08/2021  Past Surgical History:  Procedure Laterality Date  . ABDOMINAL HYSTERECTOMY     . CATARACT EXTRACTION Bilateral   . COLONOSCOPY  x 2   w/Dr Carlean Purl    Family History  Problem Relation Age of Onset  . Cancer Mother        liver  . Liver cancer Mother   . Heart disease Father   . Heart attack Father   . Hypertension Sister   . Cancer  kidney Sister   . Colon cancer Neg Hx   . Breast cancer Neg Hx     Social History   Tobacco Use  . Smoking status: Former Smoker    Packs/day: 0.33    Years: 45.00    Pack years: 14.85    Types: Cigarettes    Start date: 58    Quit date: 06/16/2004    Years since quitting: 16.7  . Smokeless tobacco: Never Used  Substance Use Topics  . Alcohol use: No  . Drug use: No    ROS Constitutional: Denies fever, chills, weight loss/gain, headaches, insomnia,  night sweats, and change in appetite. Does c/o fatigue. Eyes: Denies redness, blurred vision, diplopia, discharge, itchy, watery eyes.  ENT: Denies discharge, congestion, post nasal drip, epistaxis, sore throat, earache, hearing loss, dental pain, Tinnitus, Vertigo, Sinus pain, snoring.  Cardio: Denies chest pain, palpitations, irregular heartbeat, syncope, dyspnea, diaphoresis, orthopnea, PND, claudication, edema Respiratory: denies cough, dyspnea, DOE, pleurisy, hoarseness, laryngitis, wheezing.  Gastrointestinal: Denies dysphagia, heartburn, reflux, water brash, pain, cramps, nausea, vomiting, bloating, diarrhea, constipation, hematemesis, melena, hematochezia, jaundice, hemorrhoids Genitourinary: Denies dysuria, frequency, urgency, nocturia, hesitancy, discharge, hematuria, flank pain Breast: Breast lumps, nipple discharge, bleeding.  Musculoskeletal: Denies arthralgia, myalgia, stiffness, Jt. Swelling, pain, limp, and strain/sprain. Denies falls. Skin: Denies puritis, rash, hives, warts, acne, eczema, changing in skin lesion Neuro: No weakness, tremor, incoordination, spasms, paresthesia, pain Psychiatric: Denies confusion, memory loss, sensory loss. Denies  Depression. Endocrine: Denies change in weight, skin, hair change, nocturia, and paresthesia, diabetic polys, visual blurring, hyper / hypo glycemic episodes.  Heme/Lymph: No excessive bleeding, bruising, enlarged lymph nodes.  Physical Exam  BP  184/79   P55   T 97.3 F  R 16   Ht 5\' 3"     Wt 181 lb    SpO2 95%   BMI 32.10   General Appearance: Well nourished, well groomed and in no apparent distress.  Eyes: PERRLA, EOMs, conjunctiva no swelling or erythema, normal fundi and vessels. Sinuses: No frontal/maxillary tenderness ENT/Mouth: EACs patent / TMs  nl. Nares clear without erythema, swelling, mucoid exudates. Oral hygiene is good. No erythema, swelling, or exudate. Tongue normal, non-obstructing. Tonsils not swollen or erythematous. Hearing normal.  Neck: Supple, thyroid not palpable. No bruits, nodes or JVD. Respiratory: Respiratory effort normal.  BS equal and clear bilateral without rales, rhonci, wheezing or stridor. Cardio: Heart sounds are normal with regular rate and rhythm and no murmurs, rubs or gallops. Peripheral pulses are normal and equal bilaterally without edema. No aortic or femoral bruits. Chest: symmetric with normal excursions and percussion. Breasts: Symmetric, without lumps, nipple discharge, retractions, or fibrocystic changes.  Abdomen: Flat, soft with bowel sounds active. Nontender, no guarding, rebound, hernias, masses, or organomegaly.  Lymphatics: Non tender without lymphadenopathy.  Genitourinary:  Musculoskeletal: Full ROM all peripheral extremities, joint stability, 5/5 strength, and normal gait. Skin: Warm and dry without rashes, lesions, cyanosis, clubbing or  ecchymosis.  Neuro: Cranial nerves intact. DTR's flat. Normal muscle tone, no cerebellar symptoms. Sensation decreased to touch, vibratory and Monofilament from ankles to the toes bilaterally.  Pysch: Alert and oriented X 3, normal affect, Insight and Judgment appropriate.   Assessment and  Plan   1. Essential hypertension  - Add Rx Benicar /Olmesartan 40 mg qpm - check bp 2 x /day & ROV 2 weeks   - EKG 12-Lead - Urinalysis, Routine w reflex microscopic - Microalbumin / creatinine urine ratio - CBC with Differential/Platelet - COMPLETE METABOLIC PANEL WITH GFR - Magnesium - TSH  2. Hyperlipidemia associated with type 2 diabetes mellitus ([place])  - EKG 12-Lead - Lipid panel - TSH  3. Type 2 diabetes mellitus with stage 4 chronic kidney  disease, without long-term current use of insulin (HCC)  - EKG 12-Lead - PTH, intact and calcium - Hemoglobin A1c - Insulin, random  4. Vitamin D deficiency  - VITAMIN D 25 Hydroxy  5. Gastroesophageal reflux disease  - CBC with Differential/Platelet  6. Painful diabetic neuropathy (HCC)  - HM DIABETES FOOT EXAM - LOW EXTREMITY NEUR EXAM DOCUM  7. Idiopathic gout  - Uric acid  8. Secondary hyperparathyroidism of renal origin ([place])  - PTH, intact and calcium - COMPLETE METABOLIC PANEL WITH GFR  9. Screening for colorectal cancer  - POC Hemoccult Bld/Stl   10. Screening for ischemic heart disease  - EKG 12-Lead  11. FHx: heart disease  - EKG 12-Lead  12. Former smoker  - EKG 12-Lead  13. Medication management  - Urinalysis, Routine w reflex microscopic - Microalbumin / creatinine urine ratio - Uric acid - CBC with Differential/Platelet - COMPLETE METABOLIC PANEL WITH GFR - Magnesium - Lipid panel - TSH - Hemoglobin A1c - Insulin, random - VITAMIN D 25 Hydroxy           Patient was counseled in prudent diet to achieve/maintain BMI less than 25 for weight control, BP monitoring, regular exercise and medications. Discussed med's effects and SE's. Screening labs and tests as requested with regular follow-up as recommended. Over 40 minutes of exam, counseling, chart review and high complex critical decision making was performed.   Kirtland Bouchard, MD

## 2021-03-30 ENCOUNTER — Other Ambulatory Visit: Payer: Self-pay

## 2021-03-30 ENCOUNTER — Encounter: Payer: Self-pay | Admitting: Internal Medicine

## 2021-03-30 ENCOUNTER — Ambulatory Visit (INDEPENDENT_AMBULATORY_CARE_PROVIDER_SITE_OTHER): Payer: Medicare Other | Admitting: Internal Medicine

## 2021-03-30 ENCOUNTER — Other Ambulatory Visit: Payer: Self-pay | Admitting: *Deleted

## 2021-03-30 VITALS — BP 184/79 | HR 55 | Temp 97.3°F | Resp 16 | Ht 63.0 in | Wt 181.2 lb

## 2021-03-30 DIAGNOSIS — M1000 Idiopathic gout, unspecified site: Secondary | ICD-10-CM

## 2021-03-30 DIAGNOSIS — E1169 Type 2 diabetes mellitus with other specified complication: Secondary | ICD-10-CM

## 2021-03-30 DIAGNOSIS — E559 Vitamin D deficiency, unspecified: Secondary | ICD-10-CM | POA: Diagnosis not present

## 2021-03-30 DIAGNOSIS — K219 Gastro-esophageal reflux disease without esophagitis: Secondary | ICD-10-CM | POA: Diagnosis not present

## 2021-03-30 DIAGNOSIS — Z136 Encounter for screening for cardiovascular disorders: Secondary | ICD-10-CM | POA: Diagnosis not present

## 2021-03-30 DIAGNOSIS — E1140 Type 2 diabetes mellitus with diabetic neuropathy, unspecified: Secondary | ICD-10-CM

## 2021-03-30 DIAGNOSIS — E785 Hyperlipidemia, unspecified: Secondary | ICD-10-CM

## 2021-03-30 DIAGNOSIS — N184 Chronic kidney disease, stage 4 (severe): Secondary | ICD-10-CM | POA: Diagnosis not present

## 2021-03-30 DIAGNOSIS — Z87891 Personal history of nicotine dependence: Secondary | ICD-10-CM | POA: Diagnosis not present

## 2021-03-30 DIAGNOSIS — Z79899 Other long term (current) drug therapy: Secondary | ICD-10-CM | POA: Diagnosis not present

## 2021-03-30 DIAGNOSIS — I10 Essential (primary) hypertension: Secondary | ICD-10-CM | POA: Diagnosis not present

## 2021-03-30 DIAGNOSIS — E1122 Type 2 diabetes mellitus with diabetic chronic kidney disease: Secondary | ICD-10-CM | POA: Diagnosis not present

## 2021-03-30 DIAGNOSIS — N2581 Secondary hyperparathyroidism of renal origin: Secondary | ICD-10-CM

## 2021-03-30 DIAGNOSIS — Z8249 Family history of ischemic heart disease and other diseases of the circulatory system: Secondary | ICD-10-CM

## 2021-03-30 DIAGNOSIS — Z1211 Encounter for screening for malignant neoplasm of colon: Secondary | ICD-10-CM

## 2021-03-30 MED ORDER — OLMESARTAN MEDOXOMIL 40 MG PO TABS: ORAL_TABLET | ORAL | 3 refills | Status: DC

## 2021-03-30 MED ORDER — GABAPENTIN 100 MG PO CAPS: ORAL_CAPSULE | ORAL | 1 refills | Status: DC

## 2021-03-31 LAB — COMPLETE METABOLIC PANEL WITH GFR
AG Ratio: 2 (calc) (ref 1.0–2.5)
ALT: 16 U/L (ref 6–29)
AST: 20 U/L (ref 10–35)
Albumin: 4.1 g/dL (ref 3.6–5.1)
Alkaline phosphatase (APISO): 92 U/L (ref 37–153)
BUN/Creatinine Ratio: 23 (calc) — ABNORMAL HIGH (ref 6–22)
BUN: 46 mg/dL — ABNORMAL HIGH (ref 7–25)
CO2: 26 mmol/L (ref 20–32)
Calcium: 9.3 mg/dL (ref 8.6–10.4)
Chloride: 104 mmol/L (ref 98–110)
Creat: 1.98 mg/dL — ABNORMAL HIGH (ref 0.60–0.93)
GFR, Est African American: 27 mL/min/{1.73_m2} — ABNORMAL LOW (ref >=60)
GFR, Est Non African American: 24 mL/min/{1.73_m2} — ABNORMAL LOW (ref >=60)
Globulin: 2.1 g/dL (calc) (ref 1.9–3.7)
Glucose, Bld: 93 mg/dL (ref 65–99)
Potassium: 4.9 mmol/L (ref 3.5–5.3)
Sodium: 139 mmol/L (ref 135–146)
Total Bilirubin: 0.7 mg/dL (ref 0.2–1.2)
Total Protein: 6.2 g/dL (ref 6.1–8.1)

## 2021-03-31 LAB — CBC WITH DIFFERENTIAL/PLATELET
Absolute Monocytes: 748 cells/uL (ref 200–950)
Basophils Absolute: 43 cells/uL (ref 0–200)
Basophils Relative: 0.5 %
Eosinophils Absolute: 221 cells/uL (ref 15–500)
Eosinophils Relative: 2.6 %
HCT: 35.2 % (ref 35.0–45.0)
Hemoglobin: 11.6 g/dL — ABNORMAL LOW (ref 11.7–15.5)
Lymphs Abs: 2338 cells/uL (ref 850–3900)
MCH: 30.2 pg (ref 27.0–33.0)
MCHC: 33 g/dL (ref 32.0–36.0)
MCV: 91.7 fL (ref 80.0–100.0)
MPV: 11.3 fL (ref 7.5–12.5)
Monocytes Relative: 8.8 %
Neutro Abs: 5151 cells/uL (ref 1500–7800)
Neutrophils Relative %: 60.6 %
Platelets: 235 10*3/uL (ref 140–400)
RBC: 3.84 10*6/uL (ref 3.80–5.10)
RDW: 13.6 % (ref 11.0–15.0)
Total Lymphocyte: 27.5 %
WBC: 8.5 10*3/uL (ref 3.8–10.8)

## 2021-03-31 LAB — MICROALBUMIN / CREATININE URINE RATIO
Creatinine, Urine: 33 mg/dL (ref 20–275)
Microalb Creat Ratio: 21 mcg/mg creat (ref <30)
Microalb, Ur: 0.7 mg/dL

## 2021-03-31 LAB — URINALYSIS, ROUTINE W REFLEX MICROSCOPIC
Bilirubin Urine: NEGATIVE
Glucose, UA: NEGATIVE
Hgb urine dipstick: NEGATIVE
Ketones, ur: NEGATIVE
Leukocytes,Ua: NEGATIVE
Nitrite: NEGATIVE
Protein, ur: NEGATIVE
Specific Gravity, Urine: 1.007 (ref 1.001–1.035)
pH: <=5 (ref 5.0–8.0)

## 2021-03-31 LAB — LIPID PANEL
Cholesterol: 139 mg/dL (ref <200)
HDL: 40 mg/dL — ABNORMAL LOW (ref >=50)
LDL Cholesterol (Calc): 73 mg/dL (calc)
Non-HDL Cholesterol (Calc): 99 mg/dL (calc) (ref <130)
Total CHOL/HDL Ratio: 3.5 (calc) (ref <5.0)
Triglycerides: 192 mg/dL — ABNORMAL HIGH (ref <150)

## 2021-03-31 LAB — HEMOGLOBIN A1C
Hgb A1c MFr Bld: 5.8 % of total Hgb — ABNORMAL HIGH (ref <5.7)
Mean Plasma Glucose: 120 mg/dL
eAG (mmol/L): 6.6 mmol/L

## 2021-03-31 LAB — MAGNESIUM: Magnesium: 2.3 mg/dL (ref 1.5–2.5)

## 2021-03-31 LAB — PTH, INTACT AND CALCIUM
Calcium: 9.3 mg/dL (ref 8.6–10.4)
PTH: 55 pg/mL (ref 16–77)

## 2021-03-31 LAB — VITAMIN D 25 HYDROXY (VIT D DEFICIENCY, FRACTURES): Vit D, 25-Hydroxy: 56 ng/mL (ref 30–100)

## 2021-03-31 LAB — TSH: TSH: 1.75 mIU/L (ref 0.40–4.50)

## 2021-03-31 LAB — INSULIN, RANDOM: Insulin: 15.8 u[IU]/mL

## 2021-03-31 LAB — URIC ACID: Uric Acid, Serum: 3.4 mg/dL (ref 2.5–7.0)

## 2021-03-31 MED ORDER — VALSARTAN 160 MG PO TABS: ORAL_TABLET | ORAL | 3 refills | Status: DC

## 2021-03-31 NOTE — Progress Notes (Signed)
============================================================ ============================================================  -  Uric Acid / Gout Test - Normal & OK  ============================================================ ============================================================  -  CBC -  shows slightly low Red Cell Count - Hgb 11.6 gm%                                                                              - Mild anemia - But is stable ! ============================================================ ============================================================  -  Kidney functions still Stage 4 - GFR = 24  &                                                               is Stable over the last 2 years - Great  ! ============================================================ ============================================================  -  Total Chol = 139  and LSL = 73 - Both  Excellent   - Very low risk for Heart Attack  / Stroke ============================================================ ============================================================  -  A1c = 5.8% - still borderline early Diabetes  - Being diabetic has a  300% increased risk for heart attack,  stroke, cancer, and alzheimer- type vascular dementia.   -  It is very important that you work harder with diet by  avoiding all foods that are white except chicken,   fish & calliflower.  - Avoid white rice  (brown & wild rice is OK),   - Avoid white potatoes  (sweet potatoes in moderation is OK),   White bread or wheat bread or anything made out of   white flour like bagels, donuts, rolls, buns, biscuits, cakes,  - pastries, cookies, pizza crust, and pasta (made from  white flour & egg whites)   - vegetarian pasta or spinach or wheat pasta is OK.  - Multigrain breads like Arnold's, Pepperidge Farm or   multigrain sandwich thins or high fiber breads like   [place] bread or "Dave's Killer" breads that are   4 to 5 grams fiber per slice !  are best.    Diet, exercise and weight loss can reverse and cure  diabetes in the early stages.    - Diet, exercise and weight loss is very important in the   control and prevention of complications of diabetes which  affects every system in your body, ie.   -Brain - dementia/stroke,  - eyes - glaucoma/blindness,  - heart - heart attack/heart failure,  - kidneys - dialysis,  - stomach - gastric paralysis,  - intestines - malabsorption,  - nerves - severe painful neuritis,  - circulation - gangrene & loss of a leg(s)  - and finally  . . . . . . . . . . . . . . . . . .    - cancer and Alzheimers. ============================================================ ============================================================  -  Vitamin D = 56 - Excellent  ============================================================ ============================================================  -  PTH hormone level that regulates calcium balance is still pending ============================================================ ============================================================  -  All Else -  Electrolytes - Liver - Magnesium & Thyroid    - all  Normal / OK ============================================================ ============================================================

## 2021-03-31 NOTE — Progress Notes (Signed)
============================================================ ============================================================  -    PTH level = 55 - the hormone that regulates calcium Balance   in the Body is Normal !  ============================================================ ============================================================

## 2021-03-31 NOTE — Addendum Note (Signed)
Addended by: Unk Pinto on: 03/31/2021 09:35 AM   Modules accepted: Orders

## 2021-04-03 ENCOUNTER — Ambulatory Visit: Payer: Medicare Other | Admitting: Internal Medicine

## 2021-04-10 ENCOUNTER — Other Ambulatory Visit: Payer: Self-pay | Admitting: Internal Medicine

## 2021-04-10 ENCOUNTER — Other Ambulatory Visit: Payer: Self-pay | Admitting: *Deleted

## 2021-04-10 DIAGNOSIS — E1140 Type 2 diabetes mellitus with diabetic neuropathy, unspecified: Secondary | ICD-10-CM

## 2021-04-10 MED ORDER — GABAPENTIN 100 MG PO CAPS: ORAL_CAPSULE | ORAL | 1 refills | Status: DC

## 2021-04-10 MED ORDER — GABAPENTIN 100 MG PO CAPS: ORAL_CAPSULE | ORAL | 0 refills | Status: DC

## 2021-04-16 NOTE — Progress Notes (Signed)
Future Appointments  Date Time Provider [place]  04/17/2021  2:30 PM Unk Pinto, MD [facility] None  07/12/2021  2:00 PM Liane Comber, NP [facility] None  04/03/2022  3:00 PM Unk Pinto, MD [facility] None    History of Present Illness:     Patient is a very nice 80 yo [name] with HTN, HLD, T2_NIDDM, GERD,Gout & Vitamin D Deficiency who returns for 2 week f/u of elevated BP 184/79  And added Valsartan 160  mg with her Atenolol 50 mg & Furosemide 40 mg.  She reports since then , home BP's are ranging in the 120's /70's. HT systems review is negative.  Medications  .  atenolol (TENORMIN) 50 MG tablet, TAKE 1 TABLET DAILY  .  furosemide (LASIX) 40 MG tablet, TAKE 1 TABLET DAILY .  valsartan (DIOVAN) 160 MG tablet, Take  1 tablet  at Night   .  atorvastatin (LIPITOR) 40 MG tablet, Take 1 tablet Daily for Cholesterol .  allopurinol (ZYLOPRIM) 300 MG tablet, TAKE 1 TABLET  EVERY DAY .  BABY ASPIRIN PO, Take daily. .  Cholecalciferol (VITAMIN D) 50 MCG (2000 UT) CAPS, Take 1 capsule  daily. .  diclofenac sodium (VOLTAREN) 1 % GEL, Apply 4 g topically 4 (four) times daily as needed. .  famotidine (PEPCID) 20 MG tablet, TAKE 1 TABLET TWICE A DAY AS NEEDED FOR REFLUX .  gabapentin (NEURONTIN) 100 MG capsule, TAKE 1 CAPSULE THREE TIMES A DAY  .  Multiple Vitamins-Minerals (ICAPS AREDS 2 PO), Take 1 capsule  daily. .  Multiple Vitamins-Minerals (MULTIVITAMIN PO), Take daily. .  Omega-3 Fatty Acids (FISH OIL PO), Take daily.  Problem list She has Hyperlipidemia, mixed; Essential hypertension; GERD (gastroesophageal reflux disease); DJD (degenerative joint disease); DDD (degenerative disc disease), lumbar; IBS (irritable bowel syndrome); CKD (chronic kidney disease) stage 4, GFR 15-29 ml/min ([place]); Vitamin D deficiency; Medication management; Idiopathic gout; Obesity (BMI 30.0-34.9); Encounter for Medicare annual wellness exam; Secondary hyperparathyroidism of renal origin  [facility]); FHx: heart disease; Former smoker; and Type 2 diabetes mellitus with stage 4 chronic kidney disease, without long-term current use of insulin (HCC) on their problem list.   Observations/Objective:  BP 140/74   Pulse (!) 56   Temp (!) 97.5 F (36.4 C)   Ht 5\' 3"  (1.6 m)   Wt 183 lb 3.2 oz (83.1 kg)   SpO2 98%   BMI 32.45 kg/m   HEENT - WNL. Neck - supple.  Chest - Clear equal BS. Cor - Nl HS. RRR w/o sig MGR. PP 1(+). No edema. MS- FROM w/o deformities.  Gait Nl. Neuro -  Nl w/o focal abnormalities. Skin  - There is a circumscribed 4 mm x 6 mm very dark brown to black lesion on the dorsum of the Rt foot.    Procedure (CPT: 11300)  Shave Excisional Biopsy     After informed consent & aseptic prep with Alcohol the area of dorsum Rt foot  was anesthetized locally with 0.5 ml 1% Lidocaine. Then with a #10 scalpel the above lesion was sharply excised with a 2 mm margin and sent to path analysis to r/o dysplastic nevus or MM. Then the wound base was deeply hyfrecated to destroy any remnant lesion and antibiotic ung and sterile dsg was applied. Patient was instructed in post-op wound care.   Assessment and Plan:  1. Essential hypertension   2. 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 The patient was provided an opportunity to ask questions and all were answered. The patient agreed with the plan and demonstrated an understanding of the instructions.        The patient was advised to call back or seek an in-person evaluation if the symptoms worsen or if the condition fails to improve as anticipated.   Kirtland Bouchard, MD

## 2021-04-17 ENCOUNTER — Encounter: Payer: Self-pay | Admitting: Internal Medicine

## 2021-04-17 ENCOUNTER — Other Ambulatory Visit: Payer: Self-pay | Admitting: Internal Medicine

## 2021-04-17 ENCOUNTER — Ambulatory Visit (INDEPENDENT_AMBULATORY_CARE_PROVIDER_SITE_OTHER): Payer: Medicare Other | Admitting: Internal Medicine

## 2021-04-17 ENCOUNTER — Other Ambulatory Visit: Payer: Self-pay

## 2021-04-17 VITALS — BP 140/74 | HR 56 | Temp 97.5°F | Ht 63.0 in | Wt 183.2 lb

## 2021-04-17 DIAGNOSIS — I10 Essential (primary) hypertension: Secondary | ICD-10-CM | POA: Diagnosis not present

## 2021-04-17 DIAGNOSIS — D485 Neoplasm of uncertain behavior of skin: Secondary | ICD-10-CM

## 2021-04-17 DIAGNOSIS — D2271 Melanocytic nevi of right lower limb, including hip: Secondary | ICD-10-CM | POA: Diagnosis not present

## 2021-04-17 NOTE — Patient Instructions (Signed)
Wound Care, Adult Taking care of your wound properly can help to prevent pain, infection, and scarring. It can also help your wound heal more quickly. Follow instructions from your health care provider about how to care for your wound. Supplies needed:  Soap and water.  Wound cleanser.  Gauze.  If needed, a clean bandage (dressing) or other type of wound dressing material to cover or place in the wound. Follow your health care provider's instructions about what dressing supplies to use.  Cream or ointment to apply to the wound, if told by your health care provider. How to care for your wound Cleaning the wound Ask your health care provider how to clean the wound. This may include:  Using mild soap and water or a wound cleanser.  Using a clean gauze to pat the wound dry after cleaning it. Do not rub or scrub the wound. Dressing care  Wash your hands with soap and water for at least 20 seconds before and after you change the dressing. If soap and water are not available, use hand sanitizer.  Change your dressing as told by your health care provider. This may include: ? Cleaning or rinsing out (irrigating) the wound. ? Placing a dressing over the wound or in the wound (packing). ? Covering the wound with an outer dressing.  Leave any stitches (sutures), skin glue, or adhesive strips in place. These skin closures may need to stay in place for 2 weeks or longer. If adhesive strip edges start to loosen and curl up, you may trim the loose edges. Do not remove adhesive strips completely unless your health care provider tells you to do that.  Ask your health care provider when you can leave the wound uncovered. Checking for infection Check your wound area every day for signs of infection. Check for:  More redness, swelling, or pain.  Fluid or blood.  Warmth.  Pus or a bad smell.   Follow these instructions at home Medicines  If you were prescribed an antibiotic medicine, cream, or  ointment, take or apply it as told by your health care provider. Do not stop using the antibiotic even if your condition improves.  If you were prescribed pain medicine, take it 30 minutes before you do any wound care or as told by your health care provider.  Take over-the-counter and prescription medicines only as told by your health care provider. Eating and drinking  Eat a diet that includes protein, vitamin A, vitamin C, and other nutrient-rich foods to help the wound heal. ? Foods rich in protein include meat, fish, eggs, dairy, beans, and nuts. ? Foods rich in vitamin A include carrots and dark green, leafy vegetables. ? Foods rich in vitamin C include citrus fruits, tomatoes, broccoli, and peppers.  Drink enough fluid to keep your urine pale yellow. General instructions  Do not take baths, swim, use a hot tub, or do anything that would put the wound underwater until your health care provider approves. Ask your health care provider if you may take showers. You may only be allowed to take sponge baths.  Do not scratch or pick at the wound. Keep it covered as told by your health care provider.  Return to your normal activities as told by your health care provider. Ask your health care provider what activities are safe for you.  Protect your wound from the sun when you are outside for the first 6 months, or for as long as told by your health care provider. Cover   up the scar area or apply sunscreen that has an SPF of at least 30.  Do not use any products that contain nicotine or tobacco, such as cigarettes, e-cigarettes, and chewing tobacco. These may delay wound healing. If you need help quitting, ask your health care provider.  Keep all follow-up visits as told by your health care provider. This is important. Contact a health care provider if:  You received a tetanus shot and you have swelling, severe pain, redness, or bleeding at the injection site.  Your pain is not controlled  with medicine.  You have any of these signs of infection: ? More redness, swelling, or pain around the wound. ? Fluid or blood coming from the wound. ? Warmth coming from the wound. ? Pus or a bad smell coming from the wound. ? A fever or chills.  You are nauseous or you vomit.  You are dizzy. Get help right away if:  You have a red streak of skin near the area around your wound.  Your wound has been closed with staples, sutures, skin glue, or adhesive strips and it begins to open up and separate.  Your wound is bleeding, and the bleeding does not stop with gentle pressure.  You have a rash.  You faint.  You have trouble breathing. These symptoms may represent a serious problem that is an emergency. Do not wait to see if the symptoms will go away. Get medical help right away. Call your local emergency services (911 in the [place] Do not drive yourself to the hospital. Summary  Always wash your hands with soap and water for at least 20 seconds before and after changing your dressing.  Change your dressing as told by your health care provider.  To help with healing, eat foods that are rich in protein, vitamin A, vitamin C, and other nutrients.  Check your wound every day for signs of infection. Contact your health care provider if you suspect that your wound is infected. This information is not intended to replace advice given to you by your health care provider. Make sure you discuss any questions you have with your health care provider. Document Revised: 09/04/2019 Document Reviewed: 09/04/2019 Elsevier Patient Education  2021 [employer]  

## 2021-04-26 NOTE — Progress Notes (Signed)
======================================= =======================================  -   Skin bx of lesion on foot was benign  ======================================= =======================================

## 2021-05-08 ENCOUNTER — Other Ambulatory Visit: Payer: Self-pay

## 2021-05-08 ENCOUNTER — Ambulatory Visit
Admission: RE | Admit: 2021-05-08 | Discharge: 2021-05-08 | Disposition: A | Discharge status: Home / Self Care | Payer: Medicare Other | Source: Ambulatory Visit | Attending: Internal Medicine | Admitting: Internal Medicine

## 2021-05-08 DIAGNOSIS — Z1231 Encounter for screening mammogram for malignant neoplasm of breast: Secondary | ICD-10-CM | POA: Diagnosis not present

## 2021-05-09 ENCOUNTER — Other Ambulatory Visit: Payer: Self-pay

## 2021-05-09 DIAGNOSIS — Z1212 Encounter for screening for malignant neoplasm of rectum: Secondary | ICD-10-CM

## 2021-05-09 DIAGNOSIS — Z1211 Encounter for screening for malignant neoplasm of colon: Secondary | ICD-10-CM

## 2021-05-09 LAB — POC HEMOCCULT BLD/STL (HOME/3-CARD/SCREEN)
Card #2 Fecal Occult Blod, POC: NEGATIVE
Card #3 Fecal Occult Blood, POC: NEGATIVE
Fecal Occult Blood, POC: NEGATIVE

## 2021-05-15 DIAGNOSIS — Z1211 Encounter for screening for malignant neoplasm of colon: Secondary | ICD-10-CM | POA: Diagnosis not present

## 2021-05-15 DIAGNOSIS — Z1212 Encounter for screening for malignant neoplasm of rectum: Secondary | ICD-10-CM | POA: Diagnosis not present

## 2021-06-14 ENCOUNTER — Other Ambulatory Visit: Payer: Self-pay | Admitting: Adult Health

## 2021-06-20 ENCOUNTER — Other Ambulatory Visit: Payer: Self-pay | Admitting: Adult Health

## 2021-07-10 ENCOUNTER — Other Ambulatory Visit: Payer: Self-pay | Admitting: Internal Medicine

## 2021-07-10 DIAGNOSIS — I10 Essential (primary) hypertension: Secondary | ICD-10-CM

## 2021-07-10 MED ORDER — VALSARTAN 160 MG PO TABS: ORAL_TABLET | ORAL | 3 refills | Status: DC

## 2021-07-12 ENCOUNTER — Ambulatory Visit: Payer: Medicare Other | Admitting: Adult Health

## 2021-07-12 DIAGNOSIS — H353132 Nonexudative age-related macular degeneration, bilateral, intermediate dry stage: Secondary | ICD-10-CM | POA: Diagnosis not present

## 2021-07-12 NOTE — Progress Notes (Signed)
MEDICARE ANNUAL WELLNESS VISIT AND FOLLOW UP  Assessment:   Diagnoses and all orders for this visit:  Encounter for Medicare annual wellness exam Due annually  Essential hypertension Continue medication Monitor blood pressure at home; call if consistently over 130/80 Continue DASH diet.   Reminder to go to the ER if any CP, SOB, nausea, dizziness, severe HA, changes vision/speech, left arm numbness and tingling and jaw pain.  Irritable bowel syndrome, unspecified type Symptoms stable with lifestyle modification  Gastroesophageal reflux disease, esophagitis presence not specified Well managed on Pepcid Discussed diet, avoiding triggers and other lifestyle changes  Primary osteoarthritis involving multiple joints Mild, Left knee the worst Uses tylenol, gabapentin, voltaren PRN Continue follow up with ortho as needed  CKD (chronic kidney disease) stage 4, GFR 15-29 ml/min (HCC) Increase fluids, avoid NSAIDS, monitor sugars, will monitor Continue follow up with nephrology- Dr. Posey Pronto  Vitamin D deficiency Continue low dose as recommended by nephrology   Medication management CBC, CMP/GFR  Mixed hyperlipidemia Continue medications Continue low cholesterol diet and exercise.  Check lipid panel.   Idiopathic gout, unspecified chronicity, unspecified site Continue allopurinol Diet discussed  Obesity (BMI 30.0-34.9) Long discussion about weight loss, diet, and exercise- goal is to get 150 minutes of exercise / week Recommended diet heavy in fruits and veggies and low in animal meats, cheeses, and dairy products, appropriate calorie intake Discussed appropriate weight for height Follow up at next visit  Lifestyle controlled T2DM with CKD 4 ([place]) Discussed disease and risks; managed by lifestyle Discussed diet/exercise, weight management  A1C  Secondary hyperparathyroidism of renal origin ([place]) Off of calcium supplement; low dose vitamin D - 2000 units only; improved;  monitoring only; nephrology(Dr. Posey Pronto) is following  Screening for Thyroid Disorder       - TSH  Over 40 minutes of exam, counseling, chart review and critical decision making was performed Future Appointments  Date Time Provider Horace  04/03/2022  3:00 PM Unk Pinto, MD [facility] None  07/16/2022  2:00 PM Magda Bernheim, NP [facility] None     Plan:   During the course of the visit the patient was educated and counseled about appropriate screening and preventive services including:   Pneumococcal vaccine  Prevnar 13 Influenza vaccine Td vaccine Screening electrocardiogram Bone densitometry screening Colorectal cancer screening Diabetes screening Glaucoma screening Nutrition counseling  Advanced directives: requested   Subjective:  Kelly Cantu is a 80 y.o. female who presents for Medicare Annual Wellness Visit and 3 month follow up.   Patient has hx/o GERD controlled by lifestyle and meds (famotidine).   She is followed by Dr. 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   She has some aching intermittently in feet and hands, takes tylenol, gabapentin 100 mg TID, voltaren topical with benefit. Some left knee pain which is intermittent controlled with Tylenol and Voltaren gel  BMI is Body mass index is 32.06 kg/m., she has been working on diet, has not been exercising currently but knows she needs to start Wt Readings from Last 3 Encounters:  07/13/21 181 lb (82.1 kg)  04/17/21 183 lb 3.2 oz (83.1 kg)  03/30/21 181 lb 3.2 oz (82.2 kg)    Her blood pressure has been controlled at home, today their BP is BP: 140/70.  She does not workout. She denies chest pain, shortness of breath, dizziness.   She is on cholesterol medication (atorvastatin 20 mg daily)  and denies myalgias. Her LDL cholesterol is at goal; triglycerides remain elevated. The cholesterol last  visit was:   Lab Results  Component Value Date   CHOL 139 03/30/2021   HDL 40 (L) 03/30/2021   LDLCALC 73  03/30/2021   TRIG 192 (H) 03/30/2021   CHOLHDL 3.5 03/30/2021    She has been working on diet for lifestyle controlled T2DM (A1C 6.6% and 6.5% in 2017), and denies foot ulcerations, increased appetite, nausea, paresthesia of the feet, polydipsia, polyuria, visual disturbances, vomiting and weight loss. Last A1C in the office was:  Lab Results  Component Value Date   HGBA1C 5.8 (H) 03/30/2021   She has CKD 4 followed by nephrology Dr. Posey Pronto, also follows secondary hyperparathyroid of renal origin. Drinks 4+ bottles of water daily. Last GFR: Lab Results  Component Value Date   GFRNONAA 24 (L) 03/30/2021   Patient is on vitamin D3 2000 IU per nephrology instructions:  Lab Results  Component Value Date   VD25OH 56 03/30/2021     Patient is on allopurinol for gout and does not report a recent flare.  Lab Results  Component Value Date   LABURIC 3.4 03/30/2021     Medication Review: Current Outpatient Medications on File Prior to Visit  Medication Sig Dispense Refill   allopurinol (ZYLOPRIM) 300 MG tablet TAKE 1 TABLET BY MOUTH EVERY DAY 90 tablet 99   atenolol (TENORMIN) 50 MG tablet TAKE 1 TABLET DAILY FOR BLOOD PRESSURE 90 tablet 3   atorvastatin (LIPITOR) 40 MG tablet TAKE 1 TABLET DAILY FOR CHOLESTEROL 90 tablet 3   BABY ASPIRIN PO Take 81 mg by mouth daily.     Cholecalciferol (VITAMIN D) 50 MCG (2000 UT) CAPS Take 1 capsule by mouth daily.     diclofenac sodium (VOLTAREN) 1 % GEL Apply 4 g topically 4 (four) times daily as needed. 500 g 6   famotidine (PEPCID) 20 MG tablet TAKE 1 TABLET TWICE A DAY AS NEEDED FOR REFLUX 180 tablet 3   furosemide (LASIX) 40 MG tablet TAKE 1 TABLET DAILY FOR BLOOD PRESSURE AND FLUID 90 tablet 3   gabapentin (NEURONTIN) 100 MG capsule TAKE 1 CAPSULE THREE TIMES A DAY AS NEEDED FOR PAINFUL PERIPHERAL NEUROPATHY 270 capsule 3   Multiple Vitamins-Minerals (ICAPS AREDS 2 PO) Take 1 capsule by mouth daily.     Multiple Vitamins-Minerals (MULTIVITAMIN  PO) Take by mouth daily.     Omega-3 Fatty Acids (FISH OIL PO) Take by mouth daily.     valsartan (DIOVAN) 160 MG tablet Take  1 tablet  at Night  for BP 90 tablet 3   No current facility-administered medications on file prior to visit.    Allergies  Allergen Reactions   Ace Inhibitors     Pt unsure of reaction   Feldene [Piroxicam] Rash    Current Problems (verified) Patient Active Problem List   Diagnosis Date Noted   FHx: heart disease 07/31/2018   Former smoker 07/31/2018   Type 2 diabetes mellitus with stage 4 chronic kidney disease, without long-term current use of insulin ([place]) 07/31/2018   Secondary hyperparathyroidism of renal origin ([place]) 04/10/2018   Encounter for Medicare annual wellness exam 11/13/2015   Obesity (BMI 30.0-34.9) 11/11/2015   Idiopathic gout 04/11/2015   Vitamin D deficiency 09/29/2014   Medication management 09/29/2014   CKD (chronic kidney disease) stage 4, GFR 15-29 ml/min (HCC) 07/22/2014   Hyperlipidemia, mixed    Essential hypertension    GERD (gastroesophageal reflux disease)    DDD (degenerative disc disease), lumbar    IBS (irritable bowel syndrome)  Screening Tests Immunization History  Administered Date(s) Administered   DT (Pediatric) 09/29/2014, 09/20/2015   Influenza Split 09/28/2013   Influenza, High Dose Seasonal PF 10/13/2014, 09/03/2016, 10/01/2017, 11/03/2018, 10/10/2020   Influenza-Unspecified 08/04/2015   PFIZER SARS-COV-2 Pediatric Vaccination 5-38yrs 10/07/2020   PFIZER(Purple Top)SARS-COV-2 Vaccination 01/07/2020, 02/01/2020   Pneumococcal Conjugate-13 10/13/2014   Pneumococcal Polysaccharide-23 09/23/2012   Td 09/02/2004   Zoster, Live 10/13/2013   Preventative care: Last colonoscopy: 2015, DONE due to age unless problems Last mammogram: 05/08/2021 negative repeat 1 year Last pap smear/pelvic exam: remote, s/p abd hysterectomy   DEXA:2011, 07/2018 T 0.4 normal CXR 2015 US renal 2012  Prior vaccinations: TD  or Tdap: 2016 Influenza: 10/10/2020  Pneumococcal: 2013 Prevnar13: 2015 Shingles/Zostavax: 2014 Covid 19: 2/2, 2021, pfizer  Names of Other Physician/Practitioners you currently use: 1. [facility] here for primary care 2. Dr. Nicki Reaper, eye doctor, last visit 07/12/21 sign records release 3. Randol Kern, dentist, last visit 01/2021 q6 months   Patient Care Team: Unk Pinto, MD as PCP - General (Internal Medicine) Gatha Mayer, MD as Consulting Physician (Gastroenterology) Macarthur Critchley, [place] as Referring Physician (Optometry) Elmarie Shiley, MD as Consulting Physician (Nephrology) seen 12/06/2020 stable f/u 6 months  SURGICAL HISTORY She  has a past surgical history that includes Abdominal hysterectomy; Cataract extraction (Bilateral); and Colonoscopy (x 2). FAMILY HISTORY Her family history includes Cancer in her mother and sister; Heart attack in her father; Heart disease in her father; Hypertension in her sister; Liver cancer in her mother. SOCIAL HISTORY She  reports that she quit smoking about 17 years ago. Her smoking use included cigarettes. She started smoking about 62 years ago. She has a 14.85 pack-year smoking history. She has never used smokeless tobacco. She reports that she does not drink alcohol and does not use drugs.   MEDICARE WELLNESS OBJECTIVES: Physical activity: Current Exercise Habits: The patient does not participate in regular exercise at present, Exercise limited by: orthopedic condition(s) Cardiac risk factors: Cardiac Risk Factors include: advanced age (>5men, >64 women);dyslipidemia;hypertension;obesity (BMI >30kg/m2) Depression/mood screen:   Depression screen [facility] 2/9 07/13/2021  Decreased Interest 0  Down, Depressed, Hopeless 0  PHQ - 2 Score 0    ADLs:  In your present state of health, do you have any difficulty performing the following activities: 07/13/2021 04/17/2021  Hearing? N N  Vision? N N  Difficulty concentrating  or making decisions? N N  Walking or climbing stairs? N N  Dressing or bathing? N N  Doing errands, shopping? N N  Some recent data might be hidden     Cognitive Testing  Alert? Yes  Normal Appearance?Yes  Oriented to person? Yes  Place? Yes   Time? Yes  Recall of three objects?  Yes  Can perform simple calculations? Yes  Displays appropriate judgment?Yes  Can read the correct time from a watch face?Yes  EOL planning: Does Patient Have a Medical Advance Directive?: Yes Type of Advance Directive: [place] in Chart?: No - copy requested  Review of Systems  Constitutional:  Negative for chills, fever, malaise/fatigue and weight loss.  HENT:  Negative for congestion, hearing loss, sinus pain, sore throat and tinnitus.   Eyes:  Negative for blurred vision and double vision.  Respiratory:  Positive for wheezing (occasional). Negative for cough, sputum production and shortness of breath.   Cardiovascular:  Negative for chest pain, palpitations, orthopnea, claudication, leg swelling and PND.  Gastrointestinal:  Negative for  abdominal pain, blood in stool, constipation, diarrhea, heartburn, melena, nausea and vomiting.  Genitourinary: Negative.   Musculoskeletal:  Positive for joint pain (left knee and feet). Negative for falls and myalgias.  Skin:  Negative for rash.  Neurological:  Negative for dizziness, tingling, tremors, sensory change, loss of consciousness, weakness and headaches.  Endo/Heme/Allergies:  Negative for polydipsia.  Psychiatric/Behavioral: Negative.  Negative for depression, memory loss, substance abuse and suicidal ideas. The patient is not nervous/anxious and does not have insomnia.   All other systems reviewed and are negative.   Objective:     Today's Vitals   07/13/21 1349  BP: 140/70  Pulse: (!) 52  Temp: 97.7 F (36.5 C)  SpO2: 98%  Weight: 181 lb (82.1 kg)    Body mass index is 32.06  kg/m.  General appearance: alert, no distress, WD/WN, female HEENT: normocephalic, sclerae anicteric, TMs pearly, nares patent, no discharge or erythema, pharynx normal Oral cavity: MMM, no lesions Neck: supple, no lymphadenopathy, no thyromegaly, no masses Heart: RRR, normal S1, S2, no murmurs Lungs: CTA bilaterally, no wheezes, rhonchi, or rales Abdomen: +bs, soft, non tender, non distended, no masses, no hepatomegaly, no splenomegaly Musculoskeletal: nontender, no swelling, no obvious deformity Extremities: no edema, no cyanosis, no clubbing Pulses: 2+ symmetric, upper and lower extremities, normal cap refill Neurological: alert, oriented x 3, CN2-12 intact, strength normal upper extremities and lower extremities,, DTRs 2+ throughout, no cerebellar signs, gait normal, sensation intact Psychiatric: normal affect, behavior normal, pleasant   Medicare Attestation I have personally reviewed: The patient's medical and social history Their use of alcohol, tobacco or illicit drugs Their current medications and supplements The patient's functional ability including ADLs,fall risks, home safety risks, cognitive, and hearing and visual impairment Diet and physical activities Evidence for depression or mood disorders  The patient's weight, height, BMI, and visual acuity have been recorded in the chart.  I have made referrals, counseling, and provided education to the patient based on review of the above and I have provided the patient with a written personalized care plan for preventive services.     Magda Bernheim ANP-C  [facility]  07/13/2021

## 2021-07-13 ENCOUNTER — Other Ambulatory Visit: Payer: Self-pay

## 2021-07-13 ENCOUNTER — Ambulatory Visit (INDEPENDENT_AMBULATORY_CARE_PROVIDER_SITE_OTHER): Payer: Medicare Other | Admitting: Nurse Practitioner

## 2021-07-13 ENCOUNTER — Encounter: Payer: Self-pay | Admitting: Nurse Practitioner

## 2021-07-13 VITALS — BP 140/70 | HR 52 | Temp 97.7°F | Wt 181.0 lb

## 2021-07-13 DIAGNOSIS — M5136 Other intervertebral disc degeneration, lumbar region: Secondary | ICD-10-CM | POA: Diagnosis not present

## 2021-07-13 DIAGNOSIS — Z0001 Encounter for general adult medical examination with abnormal findings: Secondary | ICD-10-CM | POA: Diagnosis not present

## 2021-07-13 DIAGNOSIS — Z1329 Encounter for screening for other suspected endocrine disorder: Secondary | ICD-10-CM | POA: Diagnosis not present

## 2021-07-13 DIAGNOSIS — K219 Gastro-esophageal reflux disease without esophagitis: Secondary | ICD-10-CM

## 2021-07-13 DIAGNOSIS — E559 Vitamin D deficiency, unspecified: Secondary | ICD-10-CM

## 2021-07-13 DIAGNOSIS — E1122 Type 2 diabetes mellitus with diabetic chronic kidney disease: Secondary | ICD-10-CM | POA: Diagnosis not present

## 2021-07-13 DIAGNOSIS — N184 Chronic kidney disease, stage 4 (severe): Secondary | ICD-10-CM

## 2021-07-13 DIAGNOSIS — Z79899 Other long term (current) drug therapy: Secondary | ICD-10-CM

## 2021-07-13 DIAGNOSIS — I10 Essential (primary) hypertension: Secondary | ICD-10-CM | POA: Diagnosis not present

## 2021-07-13 DIAGNOSIS — M1000 Idiopathic gout, unspecified site: Secondary | ICD-10-CM

## 2021-07-13 DIAGNOSIS — Z0000 Encounter for general adult medical examination without abnormal findings: Secondary | ICD-10-CM

## 2021-07-13 DIAGNOSIS — E669 Obesity, unspecified: Secondary | ICD-10-CM

## 2021-07-13 DIAGNOSIS — E785 Hyperlipidemia, unspecified: Secondary | ICD-10-CM | POA: Diagnosis not present

## 2021-07-13 DIAGNOSIS — E1140 Type 2 diabetes mellitus with diabetic neuropathy, unspecified: Secondary | ICD-10-CM

## 2021-07-13 DIAGNOSIS — E1169 Type 2 diabetes mellitus with other specified complication: Secondary | ICD-10-CM | POA: Diagnosis not present

## 2021-07-13 DIAGNOSIS — R6889 Other general symptoms and signs: Secondary | ICD-10-CM

## 2021-07-13 DIAGNOSIS — N2581 Secondary hyperparathyroidism of renal origin: Secondary | ICD-10-CM | POA: Diagnosis not present

## 2021-07-13 NOTE — Patient Instructions (Signed)

## 2021-07-14 LAB — CBC WITH DIFFERENTIAL/PLATELET
Absolute Monocytes: 624 cells/uL (ref 200–950)
Basophils Absolute: 23 cells/uL (ref 0–200)
Basophils Relative: 0.3 %
Eosinophils Absolute: 293 cells/uL (ref 15–500)
Eosinophils Relative: 3.8 %
HCT: 34.4 % — ABNORMAL LOW (ref 35.0–45.0)
Hemoglobin: 11.2 g/dL — ABNORMAL LOW (ref 11.7–15.5)
Lymphs Abs: 2426 cells/uL (ref 850–3900)
MCH: 29.9 pg (ref 27.0–33.0)
MCHC: 32.6 g/dL (ref 32.0–36.0)
MCV: 92 fL (ref 80.0–100.0)
MPV: 11.3 fL (ref 7.5–12.5)
Monocytes Relative: 8.1 %
Neutro Abs: 4335 cells/uL (ref 1500–7800)
Neutrophils Relative %: 56.3 %
Platelets: 220 10*3/uL (ref 140–400)
RBC: 3.74 10*6/uL — ABNORMAL LOW (ref 3.80–5.10)
RDW: 14 % (ref 11.0–15.0)
Total Lymphocyte: 31.5 %
WBC: 7.7 10*3/uL (ref 3.8–10.8)

## 2021-07-14 LAB — COMPLETE METABOLIC PANEL WITH GFR
AG Ratio: 2.1 (calc) (ref 1.0–2.5)
ALT: 15 U/L (ref 6–29)
AST: 19 U/L (ref 10–35)
Albumin: 4.2 g/dL (ref 3.6–5.1)
Alkaline phosphatase (APISO): 87 U/L (ref 37–153)
BUN/Creatinine Ratio: 30 (calc) — ABNORMAL HIGH (ref 6–22)
BUN: 55 mg/dL — ABNORMAL HIGH (ref 7–25)
CO2: 25 mmol/L (ref 20–32)
Calcium: 9.5 mg/dL (ref 8.6–10.4)
Chloride: 104 mmol/L (ref 98–110)
Creat: 1.85 mg/dL — ABNORMAL HIGH (ref 0.60–1.00)
Globulin: 2 g/dL (calc) (ref 1.9–3.7)
Glucose, Bld: 91 mg/dL (ref 65–99)
Potassium: 5.4 mmol/L — ABNORMAL HIGH (ref 3.5–5.3)
Sodium: 138 mmol/L (ref 135–146)
Total Bilirubin: 0.8 mg/dL (ref 0.2–1.2)
Total Protein: 6.2 g/dL (ref 6.1–8.1)
eGFR: 28 mL/min/{1.73_m2} — ABNORMAL LOW (ref >=60)

## 2021-07-14 LAB — HEMOGLOBIN A1C
Hgb A1c MFr Bld: 5.6 % of total Hgb (ref <5.7)
Mean Plasma Glucose: 114 mg/dL
eAG (mmol/L): 6.3 mmol/L

## 2021-07-14 LAB — LIPID PANEL
Cholesterol: 138 mg/dL (ref <200)
HDL: 39 mg/dL — ABNORMAL LOW (ref >=50)
LDL Cholesterol (Calc): 75 mg/dL (calc)
Non-HDL Cholesterol (Calc): 99 mg/dL (calc) (ref <130)
Total CHOL/HDL Ratio: 3.5 (calc) (ref <5.0)
Triglycerides: 163 mg/dL — ABNORMAL HIGH (ref <150)

## 2021-07-14 LAB — TSH: TSH: 1.78 mIU/L (ref 0.40–4.50)

## 2021-07-21 DIAGNOSIS — D631 Anemia in chronic kidney disease: Secondary | ICD-10-CM | POA: Diagnosis not present

## 2021-07-21 DIAGNOSIS — N2581 Secondary hyperparathyroidism of renal origin: Secondary | ICD-10-CM | POA: Diagnosis not present

## 2021-07-21 DIAGNOSIS — I129 Hypertensive chronic kidney disease with stage 1 through stage 4 chronic kidney disease, or unspecified chronic kidney disease: Secondary | ICD-10-CM | POA: Diagnosis not present

## 2021-07-21 DIAGNOSIS — N184 Chronic kidney disease, stage 4 (severe): Secondary | ICD-10-CM | POA: Diagnosis not present

## 2021-07-26 DIAGNOSIS — N184 Chronic kidney disease, stage 4 (severe): Secondary | ICD-10-CM | POA: Diagnosis not present

## 2021-08-09 IMAGING — MG MM DIGITAL SCREENING BILAT W/ TOMO AND CAD
6 of 10 series · 6 of 30 positions shown · non-contrast
Comparison: Previous exam(s).

CLINICAL DATA: Screening.

EXAM:
DIGITAL SCREENING BILATERAL MAMMOGRAM WITH TOMOSYNTHESIS AND CAD
TECHNIQUE: Bilateral screening digital craniocaudal and mediolateral oblique
mammograms were obtained. Bilateral screening digital breast
tomosynthesis was performed. The images were evaluated with
computer-aided detection.

[L MLO synth-2D (1 of 2)]
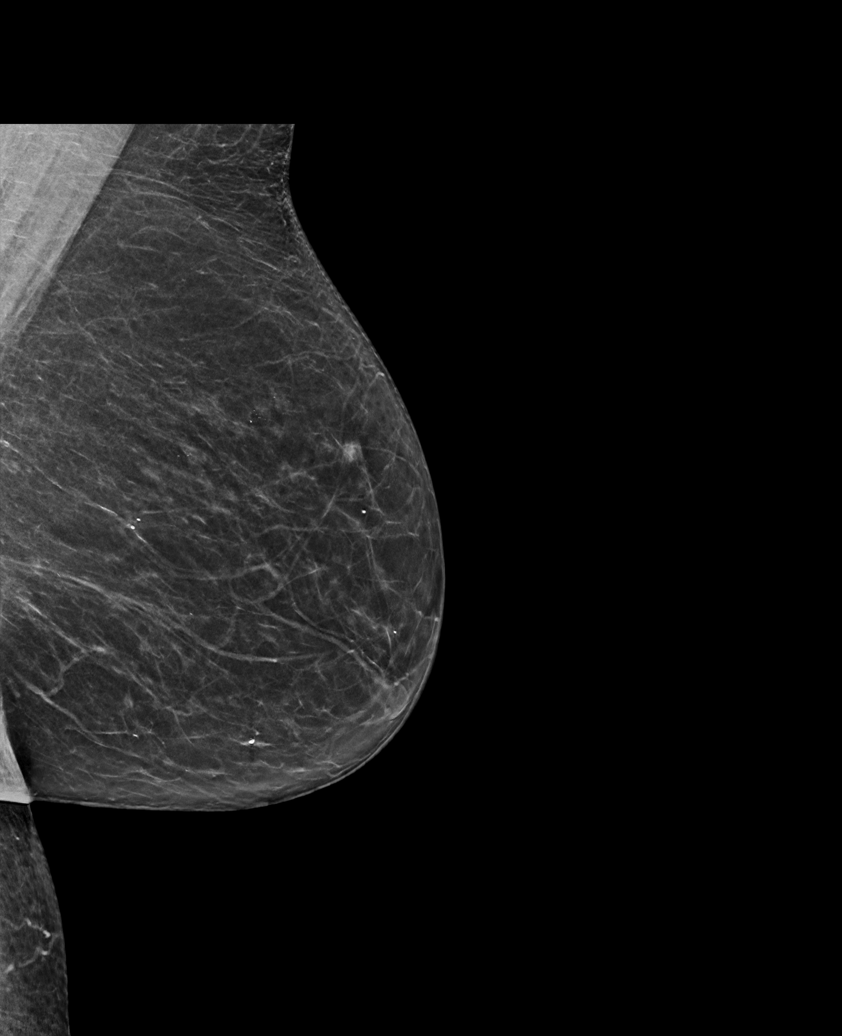

[L CC synth-2D]
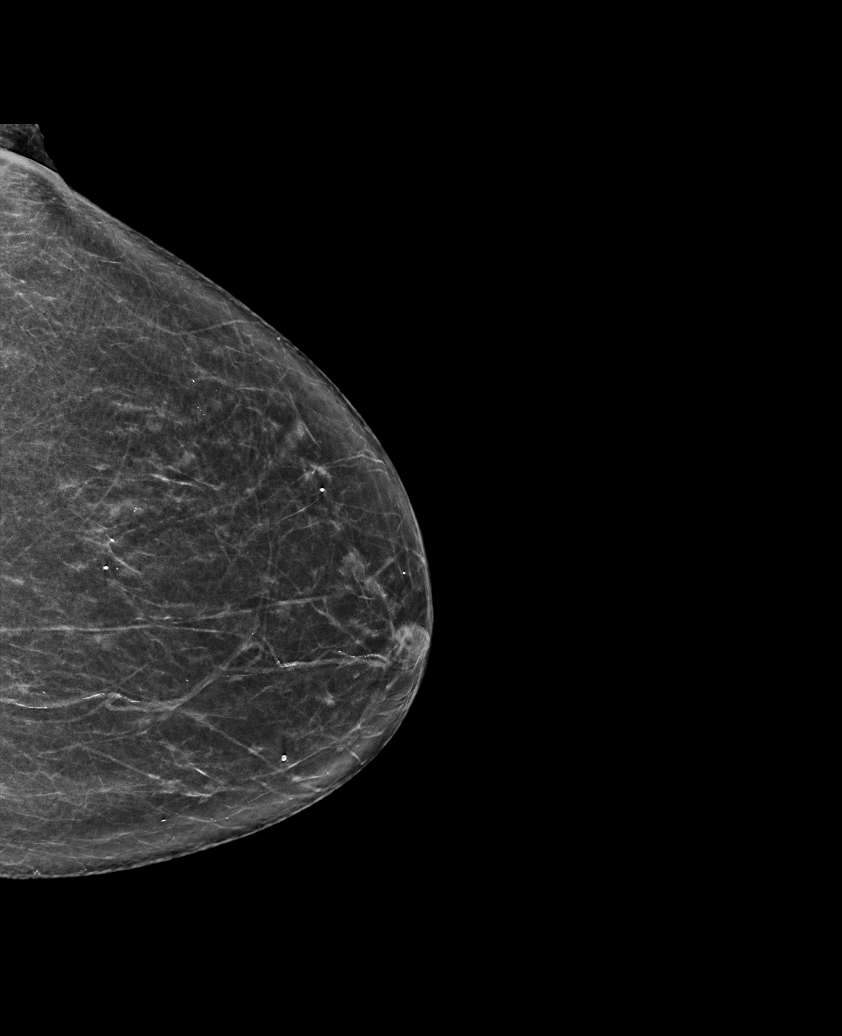

[R CC synth-2D]
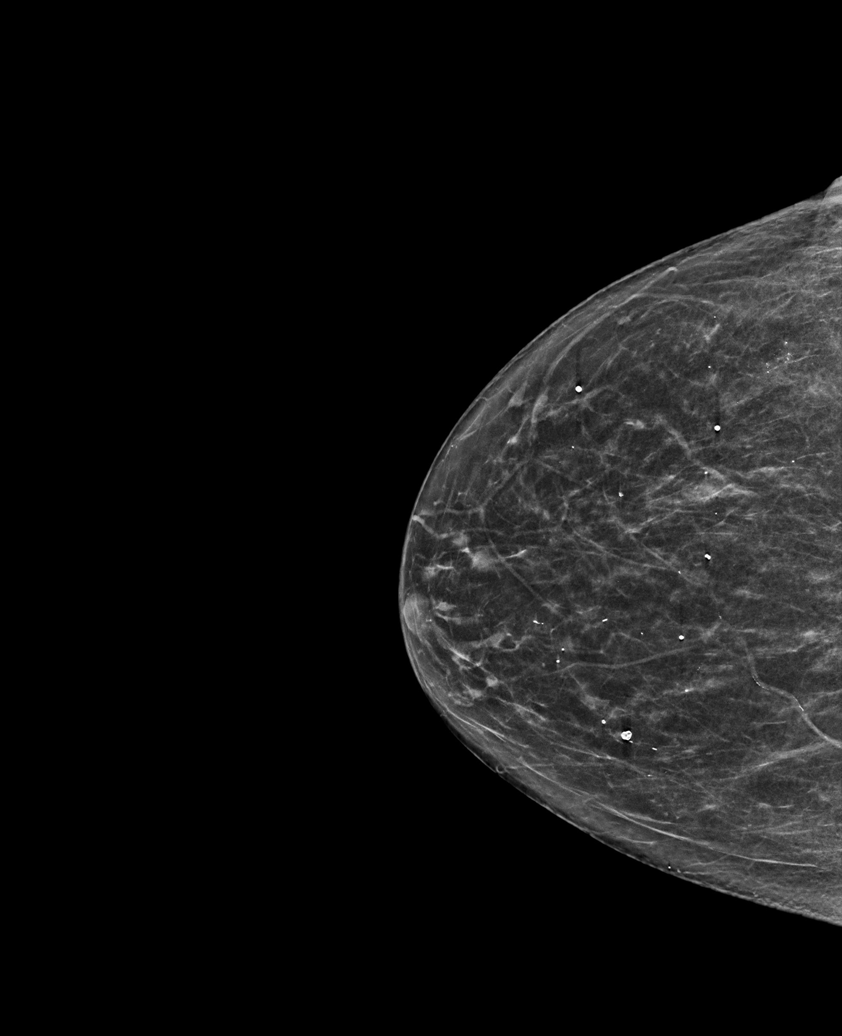

[R MLO synth-2D]
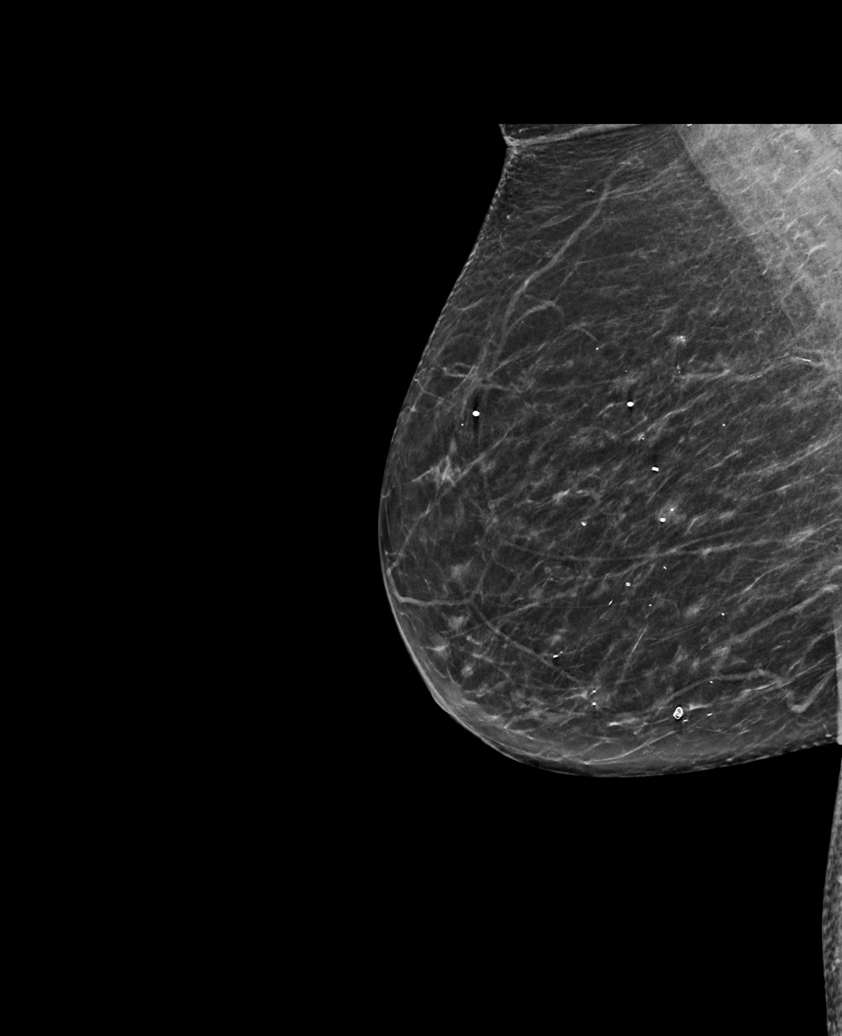

[L MLO synth-2D (2 of 2)]
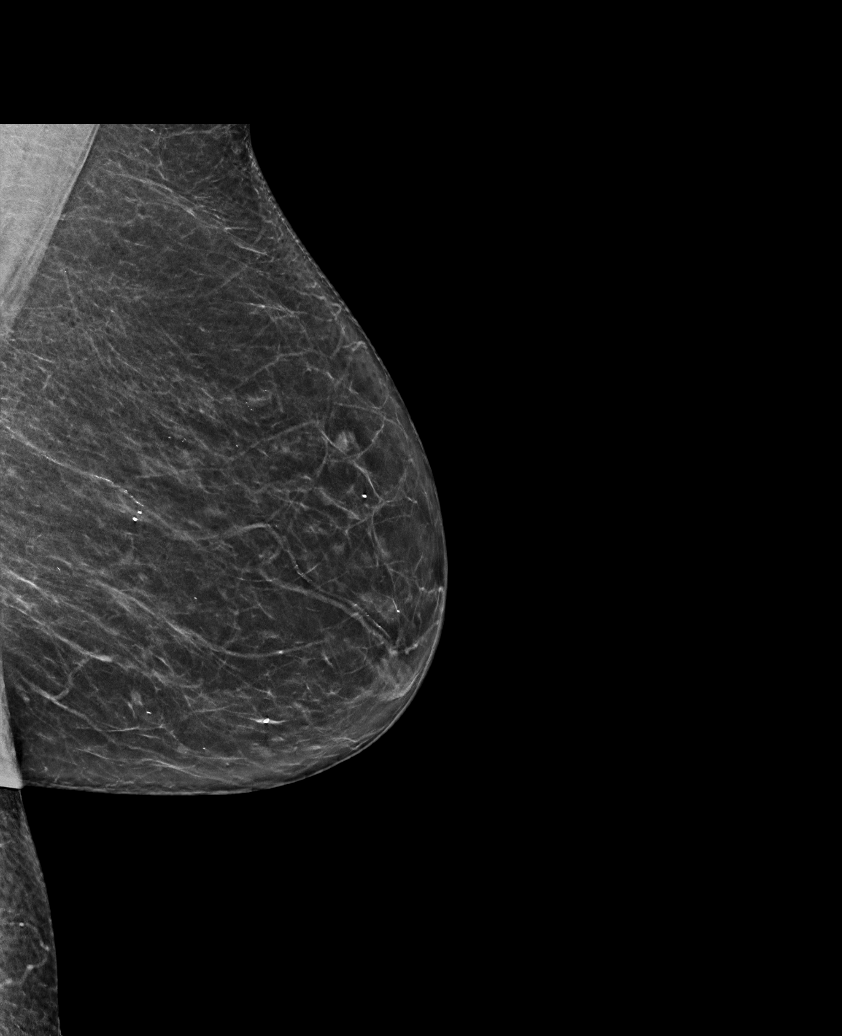

[L CC tomo · tomo slice 31/60.0]
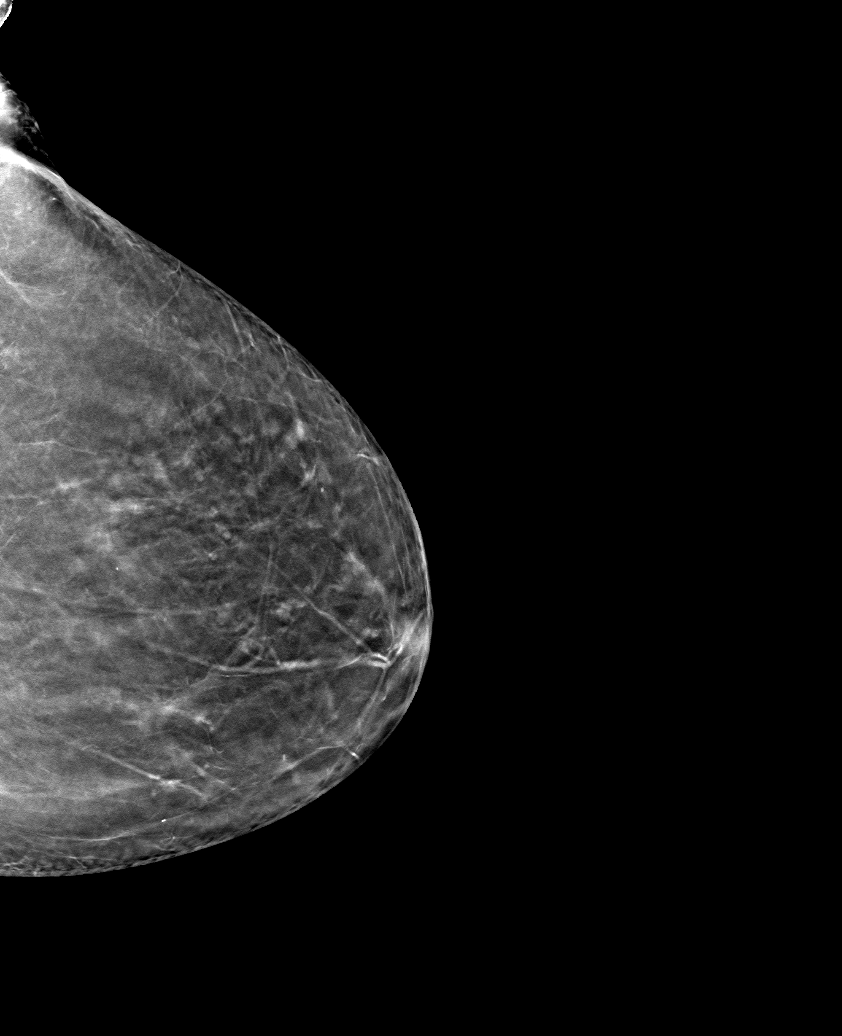

[6 of 30 positions shown; findings below may reference images not displayed]

ACR Breast Density Category b: There are scattered areas of
fibroglandular density.
FINDINGS: There are no findings suspicious for malignancy. The images were
evaluated with computer-aided detection.
IMPRESSION: No mammographic evidence of malignancy. A result letter of this
screening mammogram will be mailed directly to the patient.

RECOMMENDATION:
Screening mammogram in one year. (Code:WJ-I-BG6)

BI-RADS CATEGORY  1: Negative.

## 2021-10-12 NOTE — Progress Notes (Signed)
FOLLOW UP 6 MONTH  Assessment:   Diagnoses and all orders for this visit:  Essential hypertension Continue medication Atenolol 100mg  daily Monitor blood pressure at home; call if consistently over 130/80 Continue DASH diet.   Reminder to go to the ER if any CP, SOB, nausea, dizziness, severe HA, changes vision/speech, left arm numbness and tingling and jaw pain.  CBC  Vitamin D deficiency Continue low dose as recommended by nephrology  Vit D 2,000IU daily  Lifestyle controlled T2DM with CKD 4 ([place]) Discussed disease and risks; managed by lifestyle Discussed diet/exercise, weight management  A1C  Gastroesophageal reflux disease, esophagitis presence not specified Well managed on current medications: Pepsid 20mg  BID Discussed diet, avoiding triggers and other lifestyle changes  Hyperlipidemia Continue medications: atrovastatin 40mg  Discussed dietary and exercise modifications Low fat diet CMP Lipid  Primary osteoarthritis involving multiple joints Mild, improved after cortisone shots,  uses tylenol, gabapentin, voltaren PRN Continue follow up with ortho as needed  CKD (chronic kidney disease) stage 4, GFR 15-29 ml/min (HCC) Increase fluids, avoid NSAIDS, monitor sugars, will monitor Continue follow up with nephrology  Idiopathic gout, unspecified chronicity, unspecified site Continue allopurinol 300mg  Diet discussed Check uric acid as needed  Obesity (BMI 30.0-34.9) Long discussion about weight loss, diet, and exercise Recommended diet heavy in fruits and veggies and low in animal meats, cheeses, and dairy products, appropriate calorie intake Discussed appropriate weight for height Follow up at next visit  Secondary hyperparathyroidism of renal origin ([place]) Off of calcium supplement; low dose vitamin D only; improved; monitoring only; nephrology is following  Medication management Continued  Flu Vaccine Need High dose Flu vaccine given  Further  disposition pending results if labs check today. Discussed med's effects and SE's.   Over 30 minutes of face to face interview, exam, counseling, chart review, and critical decision making was performed.    Future Appointments  Date Time Provider [place]  04/03/2022  3:00 PM Unk Pinto, MD [facility] None  07/16/2022  2:00 PM Demetra Shiner Townsend Roger, NP [facility] None       Subjective:  Kelly Cantu is a 80 y.o. female who presents for 3 month follow up.   Patient has hx/o GERD controlled by lifestyle and meds (famotidine).   She is followed by Dr. 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   She has some aching intermittently in feet and hands, takes tylenol, gabapentin 100 mg, voltaren topical with benefit.   BMI is Body mass index is 33.48 kg/m., she has been working on diet, trying to do better after gaining weight since covid 19 s. Has not been exercising Wt Readings from Last 3 Encounters:  10/17/21 189 lb (85.7 kg)  07/13/21 181 lb (82.1 kg)  04/17/21 183 lb 3.2 oz (83.1 kg)    Her blood pressure has been controlled at home running 120-130/60's, today their BP is BP: 130/70 She does not workout. She denies chest pain, shortness of breath, dizziness.   She is on cholesterol medication (atorvastatin 20 mg daily)  and is having some muscle aches in her legs.  Her LDL cholesterol is at goal; triglycerides remain elevated. The cholesterol last visit was:   Lab Results  Component Value Date   CHOL 138 07/13/2021   HDL 39 (L) 07/13/2021   LDLCALC 75 07/13/2021   TRIG 163 (H) 07/13/2021   CHOLHDL 3.5 07/13/2021    She has been working on diet for lifestyle controlled T2DM (A1C 6.6% and 6.5% in 2017), and denies foot ulcerations, increased  appetite, nausea, paresthesia of the feet, polydipsia, polyuria, visual disturbances, vomiting and weight loss. Last A1C in the office was:  Lab Results  Component Value Date   HGBA1C 5.6 07/13/2021   She has CKD 4 followed by nephrology  Dr. Jimmy Footman (? Will be switching to Dr. Posey Pronto soon), also follows secondary hyperparathyroid of renal origin. Drinks 4+ bottles of water daily. Last GFR: Lab Results  Component Value Date   GFRNONAA 24 (L) 03/30/2021   Patient is on vitamin D3 2000 IU per nephrology instructions:  Lab Results  Component Value Date   VD25OH 56 03/30/2021     Patient is on allopurinol for gout and does not report a recent flare.  Lab Results  Component Value Date   LABURIC 3.4 03/30/2021     Medication Review: Current Outpatient Medications on File Prior to Visit  Medication Sig Dispense Refill   allopurinol (ZYLOPRIM) 300 MG tablet TAKE 1 TABLET BY MOUTH EVERY DAY 90 tablet 99   atenolol (TENORMIN) 50 MG tablet TAKE 1 TABLET DAILY FOR BLOOD PRESSURE 90 tablet 3   BABY ASPIRIN PO Take 81 mg by mouth daily.     Cholecalciferol (VITAMIN D) 50 MCG (2000 UT) CAPS Take 1 capsule by mouth daily.     diclofenac sodium (VOLTAREN) 1 % GEL Apply 4 g topically 4 (four) times daily as needed. 500 g 6   famotidine (PEPCID) 20 MG tablet TAKE 1 TABLET TWICE A DAY AS NEEDED FOR REFLUX 180 tablet 3   furosemide (LASIX) 40 MG tablet TAKE 1 TABLET DAILY FOR BLOOD PRESSURE AND FLUID 90 tablet 3   gabapentin (NEURONTIN) 100 MG capsule TAKE 1 CAPSULE THREE TIMES A DAY AS NEEDED FOR PAINFUL PERIPHERAL NEUROPATHY 270 capsule 3   Multiple Vitamins-Minerals (ICAPS AREDS 2 PO) Take 1 capsule by mouth daily.     Multiple Vitamins-Minerals (MULTIVITAMIN PO) Take by mouth daily.     Omega-3 Fatty Acids (FISH OIL PO) Take by mouth daily.     valsartan (DIOVAN) 160 MG tablet Take  1 tablet  at Night  for BP 90 tablet 3   No current facility-administered medications on file prior to visit.    Allergies  Allergen Reactions   Ace Inhibitors     Pt unsure of reaction   Feldene [Piroxicam] Rash    Current Problems (verified) Patient Active Problem List   Diagnosis Date Noted   FHx: heart disease 07/31/2018   Former smoker  07/31/2018   Type 2 diabetes mellitus with stage 4 chronic kidney disease, without long-term current use of insulin ([place]) 07/31/2018   Secondary hyperparathyroidism of renal origin ([place]) 04/10/2018   Encounter for Medicare annual wellness exam 11/13/2015   Obesity (BMI 30.0-34.9) 11/11/2015   Idiopathic gout 04/11/2015   Vitamin D deficiency 09/29/2014   Medication management 09/29/2014   CKD (chronic kidney disease) stage 4, GFR 15-29 ml/min ([place]) 07/22/2014   Hyperlipidemia, mixed    Essential hypertension    GERD (gastroesophageal reflux disease)    DDD (degenerative disc disease), lumbar    IBS (irritable bowel syndrome)     Screening Tests Immunization History  Administered Date(s) Administered   DT (Pediatric) 09/29/2014, 09/20/2015   Influenza Split 09/28/2013   Influenza, High Dose Seasonal PF 10/13/2014, 09/03/2016, 10/01/2017, 11/03/2018, 10/10/2020, 10/17/2021   Influenza-Unspecified 08/04/2015   PFIZER SARS-COV-2 Pediatric Vaccination 5-63yrs 10/07/2020   PFIZER(Purple Top)SARS-COV-2 Vaccination 01/07/2020, 02/01/2020   Pneumococcal Conjugate-13 10/13/2014   Pneumococcal Polysaccharide-23 09/23/2012   Td 09/02/2004   Zoster, Live  10/13/2013   Preventative care: Last colonoscopy: 2015, DONE due to age unless problems Last mammogram: 04/2020 Last pap smear/pelvic exam: remote, s/p abd hysterectomy   DEXA:2011, 07/2018 T 0.4 normal CXR 2015 US renal 2012  Prior vaccinations: TD or Tdap: 2016 Influenza: 10/2020, Received today Pneumococcal: 2013 Prevnar13: 2015 Shingles/Zostavax: 2014 Covid 19: 2/2, 2021, pfizer  Names of Other Physician/Practitioners you currently use: 1. [facility] here for primary care 2. Dr. Nicki Reaper, eye doctor, last visit 2021 3. Randol Kern, dentist, last visit 2021 q6 months  Patient Care Team: Unk Pinto, MD as PCP - General (Internal Medicine) Gatha Mayer, MD as Consulting Physician  (Gastroenterology) Macarthur Critchley, [place] as Referring Physician (Optometry) Elmarie Shiley, MD as Consulting Physician (Nephrology)  SURGICAL HISTORY She  has a past surgical history that includes Abdominal hysterectomy; Cataract extraction (Bilateral); and Colonoscopy (x 2). FAMILY HISTORY Her family history includes Cancer in her mother and sister; Heart attack in her father; Heart disease in her father; Hypertension in her sister; Liver cancer in her mother. SOCIAL HISTORY She  reports that she quit smoking about 17 years ago. Her smoking use included cigarettes. She started smoking about 62 years ago. She has a 14.85 pack-year smoking history. She has never used smokeless tobacco. She reports that she does not drink alcohol and does not use drugs.    Review of Systems  Constitutional:  Negative for malaise/fatigue and weight loss.  HENT:  Negative for hearing loss and tinnitus.   Eyes:  Negative for blurred vision and double vision.  Respiratory:  Negative for cough, sputum production, shortness of breath and wheezing.   Cardiovascular:  Negative for chest pain, palpitations, orthopnea, claudication, leg swelling and PND.  Gastrointestinal:  Negative for abdominal pain, blood in stool, constipation, diarrhea, heartburn, melena, nausea and vomiting.  Genitourinary: Negative.   Musculoskeletal:  Negative for falls, joint pain and myalgias.  Skin:  Negative for rash.  Neurological:  Negative for dizziness, tingling, sensory change, weakness and headaches.  Endo/Heme/Allergies:  Negative for polydipsia.  Psychiatric/Behavioral: Negative.  Negative for depression, memory loss, substance abuse and suicidal ideas. The patient is not nervous/anxious and does not have insomnia.   All other systems reviewed and are negative.   Objective:     Today's Vitals   10/17/21 1532  BP: 130/70  Pulse: (!) 51  Temp: 97.7 F (36.5 C)  SpO2: 97%  Weight: 189 lb (85.7 kg)   Body mass index is 33.48  kg/m.  General appearance: alert, no distress, WD/WN, female HEENT: normocephalic, sclerae anicteric, TMs pearly, nares patent, no discharge or erythema, pharynx normal Oral cavity: MMM, no lesions Neck: supple, no lymphadenopathy, no thyromegaly, no masses Heart: RRR, normal S1, S2, no murmurs Lungs: CTA bilaterally, no wheezes, rhonchi, or rales Abdomen: +bs, soft, non tender, non distended, no masses, no hepatomegaly, no splenomegaly Musculoskeletal: nontender, no swelling, no obvious deformity Extremities: no edema, no cyanosis, no clubbing Pulses: 2+ symmetric, upper and lower extremities, normal cap refill Neurological: alert, oriented x 3, CN2-12 intact, strength normal upper extremities and lower extremities,, DTRs 2+ throughout, no cerebellar signs, gait normal, sensation intact Psychiatric: normal affect, behavior normal, pleasant     [facility]  10/17/2021

## 2021-10-17 ENCOUNTER — Encounter: Payer: Self-pay | Admitting: Nurse Practitioner

## 2021-10-17 ENCOUNTER — Other Ambulatory Visit: Payer: Self-pay

## 2021-10-17 ENCOUNTER — Ambulatory Visit (INDEPENDENT_AMBULATORY_CARE_PROVIDER_SITE_OTHER): Payer: Medicare Other | Admitting: Nurse Practitioner

## 2021-10-17 VITALS — BP 130/70 | HR 51 | Temp 97.7°F | Wt 189.0 lb

## 2021-10-17 DIAGNOSIS — Z79899 Other long term (current) drug therapy: Secondary | ICD-10-CM | POA: Diagnosis not present

## 2021-10-17 DIAGNOSIS — E1169 Type 2 diabetes mellitus with other specified complication: Secondary | ICD-10-CM | POA: Diagnosis not present

## 2021-10-17 DIAGNOSIS — E1140 Type 2 diabetes mellitus with diabetic neuropathy, unspecified: Secondary | ICD-10-CM

## 2021-10-17 DIAGNOSIS — Z23 Encounter for immunization: Secondary | ICD-10-CM

## 2021-10-17 DIAGNOSIS — N2581 Secondary hyperparathyroidism of renal origin: Secondary | ICD-10-CM

## 2021-10-17 DIAGNOSIS — M159 Polyosteoarthritis, unspecified: Secondary | ICD-10-CM

## 2021-10-17 DIAGNOSIS — E669 Obesity, unspecified: Secondary | ICD-10-CM

## 2021-10-17 DIAGNOSIS — N184 Chronic kidney disease, stage 4 (severe): Secondary | ICD-10-CM | POA: Diagnosis not present

## 2021-10-17 DIAGNOSIS — E785 Hyperlipidemia, unspecified: Secondary | ICD-10-CM | POA: Diagnosis not present

## 2021-10-17 DIAGNOSIS — K219 Gastro-esophageal reflux disease without esophagitis: Secondary | ICD-10-CM

## 2021-10-17 DIAGNOSIS — E1122 Type 2 diabetes mellitus with diabetic chronic kidney disease: Secondary | ICD-10-CM | POA: Diagnosis not present

## 2021-10-17 DIAGNOSIS — I10 Essential (primary) hypertension: Secondary | ICD-10-CM | POA: Diagnosis not present

## 2021-10-17 DIAGNOSIS — M1000 Idiopathic gout, unspecified site: Secondary | ICD-10-CM

## 2021-10-17 DIAGNOSIS — E559 Vitamin D deficiency, unspecified: Secondary | ICD-10-CM | POA: Diagnosis not present

## 2021-10-17 MED ORDER — ROSUVASTATIN CALCIUM 10 MG PO TABS: 10.0000 mg | ORAL_TABLET | Freq: Every day | ORAL | 11 refills | Status: DC

## 2021-10-17 NOTE — Patient Instructions (Signed)

## 2021-10-18 LAB — CBC WITH DIFFERENTIAL/PLATELET
Absolute Monocytes: 607 cells/uL (ref 200–950)
Basophils Absolute: 26 cells/uL (ref 0–200)
Basophils Relative: 0.3 %
Eosinophils Absolute: 273 cells/uL (ref 15–500)
Eosinophils Relative: 3.1 %
HCT: 35.3 % (ref 35.0–45.0)
Hemoglobin: 11.6 g/dL — ABNORMAL LOW (ref 11.7–15.5)
Lymphs Abs: 2798 cells/uL (ref 850–3900)
MCH: 30.9 pg (ref 27.0–33.0)
MCHC: 32.9 g/dL (ref 32.0–36.0)
MCV: 93.9 fL (ref 80.0–100.0)
MPV: 11.2 fL (ref 7.5–12.5)
Monocytes Relative: 6.9 %
Neutro Abs: 5095 cells/uL (ref 1500–7800)
Neutrophils Relative %: 57.9 %
Platelets: 242 10*3/uL (ref 140–400)
RBC: 3.76 10*6/uL — ABNORMAL LOW (ref 3.80–5.10)
RDW: 13.9 % (ref 11.0–15.0)
Total Lymphocyte: 31.8 %
WBC: 8.8 10*3/uL (ref 3.8–10.8)

## 2021-10-18 LAB — COMPLETE METABOLIC PANEL WITH GFR
AG Ratio: 2.1 (calc) (ref 1.0–2.5)
ALT: 15 U/L (ref 6–29)
AST: 21 U/L (ref 10–35)
Albumin: 4.1 g/dL (ref 3.6–5.1)
Alkaline phosphatase (APISO): 96 U/L (ref 37–153)
BUN/Creatinine Ratio: 24 (calc) — ABNORMAL HIGH (ref 6–22)
BUN: 50 mg/dL — ABNORMAL HIGH (ref 7–25)
CO2: 27 mmol/L (ref 20–32)
Calcium: 9.8 mg/dL (ref 8.6–10.4)
Chloride: 104 mmol/L (ref 98–110)
Creat: 2.06 mg/dL — ABNORMAL HIGH (ref 0.60–1.00)
Globulin: 2 g/dL (calc) (ref 1.9–3.7)
Glucose, Bld: 85 mg/dL (ref 65–99)
Potassium: 5.6 mmol/L — ABNORMAL HIGH (ref 3.5–5.3)
Sodium: 138 mmol/L (ref 135–146)
Total Bilirubin: 0.8 mg/dL (ref 0.2–1.2)
Total Protein: 6.1 g/dL (ref 6.1–8.1)
eGFR: 24 mL/min/{1.73_m2} — ABNORMAL LOW (ref >=60)

## 2021-10-18 LAB — HEMOGLOBIN A1C
Hgb A1c MFr Bld: 5.6 % of total Hgb (ref <5.7)
Mean Plasma Glucose: 114 mg/dL
eAG (mmol/L): 6.3 mmol/L

## 2021-10-18 LAB — LIPID PANEL
Cholesterol: 153 mg/dL (ref <200)
HDL: 39 mg/dL — ABNORMAL LOW (ref >=50)
LDL Cholesterol (Calc): 82 mg/dL (calc)
Non-HDL Cholesterol (Calc): 114 mg/dL (calc) (ref <130)
Total CHOL/HDL Ratio: 3.9 (calc) (ref <5.0)
Triglycerides: 230 mg/dL — ABNORMAL HIGH (ref <150)

## 2021-10-18 NOTE — Progress Notes (Signed)
Patient is aware of lab results and instructions and 2 week NV has been scheduled to re-check potassium levels. -e. Kelly Cantu

## 2021-10-23 ENCOUNTER — Telehealth: Payer: Self-pay

## 2021-10-23 NOTE — Telephone Encounter (Signed)
Please tell pt can do 1/2 of the Rosuvastatin so taking 5 mg three days a week.

## 2021-10-23 NOTE — Telephone Encounter (Signed)
Patient wants to talk to you about Crestor. She said the bottle warns against taking if they have kidney disease, and she says she has that.

## 2021-10-24 ENCOUNTER — Other Ambulatory Visit: Payer: Self-pay | Admitting: Nurse Practitioner

## 2021-10-24 DIAGNOSIS — E1169 Type 2 diabetes mellitus with other specified complication: Secondary | ICD-10-CM

## 2021-10-24 MED ORDER — ATORVASTATIN CALCIUM 20 MG PO TABS: 20.0000 mg | ORAL_TABLET | Freq: Every day | ORAL | 11 refills | Status: DC

## 2021-10-24 NOTE — Telephone Encounter (Signed)
Yes she can go back on Lipitor 20 mg, I sent it in to her pharmacy

## 2021-11-01 ENCOUNTER — Ambulatory Visit: Payer: Medicare Other

## 2021-12-25 ENCOUNTER — Other Ambulatory Visit: Payer: Self-pay | Admitting: Adult Health

## 2021-12-25 DIAGNOSIS — K219 Gastro-esophageal reflux disease without esophagitis: Secondary | ICD-10-CM

## 2022-01-10 DIAGNOSIS — H353132 Nonexudative age-related macular degeneration, bilateral, intermediate dry stage: Secondary | ICD-10-CM | POA: Diagnosis not present

## 2022-01-30 DIAGNOSIS — N184 Chronic kidney disease, stage 4 (severe): Secondary | ICD-10-CM | POA: Diagnosis not present

## 2022-02-05 DIAGNOSIS — D631 Anemia in chronic kidney disease: Secondary | ICD-10-CM | POA: Diagnosis not present

## 2022-02-05 DIAGNOSIS — N1832 Chronic kidney disease, stage 3b: Secondary | ICD-10-CM | POA: Diagnosis not present

## 2022-02-05 DIAGNOSIS — I129 Hypertensive chronic kidney disease with stage 1 through stage 4 chronic kidney disease, or unspecified chronic kidney disease: Secondary | ICD-10-CM | POA: Diagnosis not present

## 2022-02-05 DIAGNOSIS — N2581 Secondary hyperparathyroidism of renal origin: Secondary | ICD-10-CM | POA: Diagnosis not present

## 2022-04-03 ENCOUNTER — Ambulatory Visit (INDEPENDENT_AMBULATORY_CARE_PROVIDER_SITE_OTHER): Payer: Medicare Other | Admitting: Internal Medicine

## 2022-04-03 ENCOUNTER — Encounter: Payer: Self-pay | Admitting: Internal Medicine

## 2022-04-03 VITALS — BP 136/78 | HR 62 | Temp 97.9°F | Resp 16 | Ht 63.0 in | Wt 178.0 lb

## 2022-04-03 DIAGNOSIS — M1000 Idiopathic gout, unspecified site: Secondary | ICD-10-CM

## 2022-04-03 DIAGNOSIS — E1140 Type 2 diabetes mellitus with diabetic neuropathy, unspecified: Secondary | ICD-10-CM | POA: Diagnosis not present

## 2022-04-03 DIAGNOSIS — N2581 Secondary hyperparathyroidism of renal origin: Secondary | ICD-10-CM

## 2022-04-03 DIAGNOSIS — K219 Gastro-esophageal reflux disease without esophagitis: Secondary | ICD-10-CM | POA: Diagnosis not present

## 2022-04-03 DIAGNOSIS — E1122 Type 2 diabetes mellitus with diabetic chronic kidney disease: Secondary | ICD-10-CM

## 2022-04-03 DIAGNOSIS — E559 Vitamin D deficiency, unspecified: Secondary | ICD-10-CM | POA: Diagnosis not present

## 2022-04-03 DIAGNOSIS — Z8249 Family history of ischemic heart disease and other diseases of the circulatory system: Secondary | ICD-10-CM | POA: Diagnosis not present

## 2022-04-03 DIAGNOSIS — Z87891 Personal history of nicotine dependence: Secondary | ICD-10-CM | POA: Diagnosis not present

## 2022-04-03 DIAGNOSIS — I10 Essential (primary) hypertension: Secondary | ICD-10-CM

## 2022-04-03 DIAGNOSIS — E785 Hyperlipidemia, unspecified: Secondary | ICD-10-CM | POA: Diagnosis not present

## 2022-04-03 DIAGNOSIS — Z136 Encounter for screening for cardiovascular disorders: Secondary | ICD-10-CM

## 2022-04-03 DIAGNOSIS — Z79899 Other long term (current) drug therapy: Secondary | ICD-10-CM

## 2022-04-03 DIAGNOSIS — E1169 Type 2 diabetes mellitus with other specified complication: Secondary | ICD-10-CM | POA: Diagnosis not present

## 2022-04-03 DIAGNOSIS — N184 Chronic kidney disease, stage 4 (severe): Secondary | ICD-10-CM | POA: Diagnosis not present

## 2022-04-03 NOTE — Progress Notes (Signed)
? ? ?Comprehensive Evaluation & Examination ? ? ?Future Appointments  ?Date Time Provider Department  ?04/03/2022                   CPE  3:00 PM Unk Pinto, MD [facility]  ?07/16/2022                Wellness  2:00 PM Magda Bernheim, NP [facility]  ?04/08/2023                  CPE  3:00 PM Unk Pinto, MD [facility]  ? ? ?    This very nice 81 y.o. [name] presents for a comprehensive evaluation and management of multiple medical co-morbidities.  Patient has been followed for HTN, HLD, T2_NIDDM /CKD4 and Vitamin D Deficiency. Patient's Gout is controlled on Allopurinol & likewise her GERD is controlled on her famotidine.  ? ? ?     HTN predates since  31.  Patient's BP has been controlled at home and patient denies any cardiac symptoms as chest pain, palpitations, shortness of breath, dizziness or ankle swelling. Today's BP is at  goal  - 136/78 . ? ? ?    Patient's hyperlipidemia is controlled with diet and Atorvastatin. Patient denies myalgias or other medication SE's. Last lipids were at goal except elevated Trig's : ? ?Lab Results  ?Component Value Date  ? CHOL 153 10/17/2021  ? HDL 39 (L) 10/17/2021  ? [place] 82 10/17/2021  ? TRIG 230 (H) 10/17/2021  ? CHOLHDL 3.9 10/17/2021  ? ? ? ?    Patient has hx/o prediabetes  (A1c 6.0% /2014) then T2_NIDDM (A1c 6.6% /2017) w/CKD4 (GFR 27)  which she is controlling with diet.  She follows with Dr Elmarie Shiley /Nephrology who also monitors her consequent secondary Hyperparathyroidism of renal disease.   Patient denies reactive hypoglycemic symptoms, visual blurring or diabetic polys , but she is on Gabapentin for painful Diabetic Neuropathy.  Last A1c was  normal & at goal : ? ?Lab Results  ?Component Value Date  ? HGBA1C 5.6 10/17/2021  ? ? ? ?    Finally, patient has history of Vitamin D Deficiency ("34" /2008) and last Vitamin D was at goal : ? ?Lab Results  ?Component Value Date  ? VD25OH 56 03/30/2021  ? ? ? ?Current Outpatient Medications on File Prior to Visit   ?Medication Sig  ? allopurinol 300 MG tablet TAKE 1 TABLET EVERY DAY  ? atenolol 50 MG tablet TAKE 1 TABLET DAILY   ? atorvastatin 20 MG tablet Take 1 tablet daily.  ? BABY ASPIRIN PO Take daily.  ? VITAMIN D 2000 u Take 1 capsule daily.  ? diclofenac  1 % GEL Apply 4 g topically 4 times daily as needed.  ? famotidine (20 MG tablet TAKE 1 TABLET TWICE A DAY AS NEEDED  ? furosemide 40 MG tablet TAKE 1 TABLET DAILY   ? gabapentin 100 MG capsule TAKE 1 CAPSULE THREE TIMES A DAY AS NEEDED   ? ICAPS AREDS 2  Take 1 capsule daily.  ? Multiple Vitamins-Minerals  Take daily.  ? Omega-3 FISH OIL  Take by mouth daily.  ? valsartan 160 MG tablet Take  1 tablet  at Night  for BP  ? ? ? ?Allergies  ?Allergen Reactions  ? Ace Inhibitors   ?  Pt unsure of reaction  ? Feldene [Piroxicam] Rash  ? ? ? ?Past Medical History:  ?Diagnosis Date  ? Cataract   ? Chronic kidney  disease   ? DDD (degenerative disc disease), lumbar   ? DJD (degenerative joint disease)   ? GERD (gastroesophageal reflux disease)   ? Hyperlipidemia   ? Hypertension   ? IBS (irritable bowel syndrome)   ? Prediabetes   ? ? ? ?Health Maintenance  ?Topic Date Due  ? Zoster Vaccines- Shingrix (1 of 2) Never done  ? COVID-19 Vaccine (3 - Pfizer risk series) 11/04/2020  ? FOOT EXAM  03/30/2022  ? HEMOGLOBIN A1C  04/16/2022  ? INFLUENZA VACCINE  07/03/2022  ? OPHTHALMOLOGY EXAM  07/12/2022  ? TETANUS/TDAP  09/19/2025  ? Pneumonia Vaccine 56+ Years old  Completed  ? DEXA SCAN  Completed  ? Hepatitis C Screening  Completed  ? HPV VACCINES  Aged Out  ? ? ? ?Immunization History  ?Administered Date(s) Administered  ? DT  09/29/2014, 09/20/2015  ? Influenza 09/28/2013  ? Influenza, High Dose  10/01/2017, 11/03/2018, 10/10/2020, 10/17/2021  ? Influenza 08/04/2015  ? PFIZER SARS-COV-2   10/07/2020  ? PFIZER-SARS-COV-2 Vacc 01/07/2020, 02/01/2020  ? Pneumococcal -13 10/13/2014  ? Pneumococcal -23 09/23/2012  ? Td 09/02/2004  ? Zoster, Live 10/13/2013  ? ? ?Last Colon -  08/30/2014 - Dr Carlean Purl recc no repeat due to age ? ?Last MGM - 05/08/2021 ? ?Last dexaBMD - 07/29/2018 - Normal  ? ? ?Past Surgical History:  ?Procedure Laterality Date  ? ABDOMINAL HYSTERECTOMY    ? CATARACT EXTRACTION Bilateral   ? COLONOSCOPY  x 2  ? w/Dr Carlean Purl  ? ? ?Family History  ?Problem Relation Age of Onset  ? Cancer Mother   ?     liver  ? Liver cancer Mother   ? Heart disease Father   ? Heart attack Father   ? Hypertension Sister   ? Cancer Sister   ?     kidney  ? Colon cancer Neg Hx   ? Breast cancer Neg Hx   ? ? ?Social History  ? ?Tobacco Use  ? Smoking status: Former  ?  Packs/day: 0.33  ?  Years: 45.00  ?  Pack years: 14.85  ?  Types: Cigarettes  ?  Start date: 63  ?  Quit date: 06/16/2004  ?  Years since quitting: 17.8  ? Smokeless tobacco: Never  ?Substance Use Topics  ? Alcohol use: No  ? Drug use: No  ? ? ? ROS ?Constitutional: Denies fever, chills, weight loss/gain, headaches, insomnia,  night sweats, and change in appetite. Does c/o fatigue. ?Eyes: Denies redness, blurred vision, diplopia, discharge, itchy, watery eyes.  ?ENT: Denies discharge, congestion, post nasal drip, epistaxis, sore throat, earache, hearing loss, dental pain, Tinnitus, Vertigo, Sinus pain, snoring.  ?Cardio: Denies chest pain, palpitations, irregular heartbeat, syncope, dyspnea, diaphoresis, orthopnea, PND, claudication, edema ?Respiratory: denies cough, dyspnea, DOE, pleurisy, hoarseness, laryngitis, wheezing.  ?Gastrointestinal: Denies dysphagia, heartburn, reflux, water brash, pain, cramps, nausea, vomiting, bloating, diarrhea, constipation, hematemesis, melena, hematochezia, jaundice, hemorrhoids ?Genitourinary: Denies dysuria, frequency, urgency, nocturia, hesitancy, discharge, hematuria, flank pain ?Breast: Breast lumps, nipple discharge, bleeding.  ?Musculoskeletal: Denies arthralgia, myalgia, stiffness, Jt. Swelling, pain, limp, and strain/sprain. Denies falls. ?Skin: Denies puritis, rash, hives, warts, acne,  eczema, changing in skin lesion ?Neuro: No weakness, tremor, incoordination, spasms, paresthesia, pain ?Psychiatric: Denies confusion, memory loss, sensory loss. Denies Depression. ?Endocrine: Denies change in weight, skin, hair change, nocturia, and paresthesia, diabetic polys, visual blurring, hyper / hypo glycemic episodes.  ?Heme/Lymph: No excessive bleeding, bruising, enlarged lymph nodes. ? ?Physical Exam ? ?BP 136/78   Pulse  62   Temp 97.9 ?F (36.6 ?C)   Resp 16   Ht '5\' 3"'$  (1.6 m)   Wt 178 lb (80.7 kg)   SpO2 94%   BMI 31.53 kg/m?  ? ?General Appearance: Well nourished, well groomed and in no apparent distress. ? ?Eyes: PERRLA, EOMs, conjunctiva no swelling or erythema, normal fundi and vessels. ?Sinuses: No frontal/maxillary tenderness ?ENT/Mouth: EACs patent / TMs  nl. Nares clear without erythema, swelling, mucoid exudates. Oral hygiene is good. No erythema, swelling, or exudate. Tongue normal, non-obstructing. Tonsils not swollen or erythematous. Hearing normal.  ?Neck: Supple, thyroid not palpable. No bruits, nodes or JVD. ?Respiratory: Respiratory effort normal.  BS equal and clear bilateral without rales, rhonci, wheezing or stridor. ?Cardio: Heart sounds are normal with regular rate and rhythm and no murmurs, rubs or gallops. Peripheral pulses are normal and equal bilaterally without edema. No aortic or femoral bruits. ?Chest: symmetric with normal excursions and percussion. ?Breasts: Symmetric, without lumps, nipple discharge, retractions, or fibrocystic changes.  ?Abdomen: Flat, soft with bowel sounds active. Nontender, no guarding, rebound, hernias, masses, or organomegaly.  ?Lymphatics: Non tender without lymphadenopathy.  ? ?Musculoskeletal: Full ROM all peripheral extremities, joint stability, 5/5 strength, and normal gait. ?Skin: Warm and dry without rashes, lesions, cyanosis, clubbing or  ecchymosis.  ?Neuro: Cranial nerves intact, reflexes equal bilaterally. Normal muscle tone, no  cerebellar symptoms. Sensation intact.  ?Pysch: Alert and oriented X 3, normal affect, Insight and Judgment appropriate.  ? ? ?Assessment and Plan ? ? ?1. Essential hypertension ? ?- EKG 12-Lead ?- Microalbumin / creatinine

## 2022-04-03 NOTE — Patient Instructions (Signed)

## 2022-04-04 LAB — CBC WITH DIFFERENTIAL/PLATELET
Absolute Monocytes: 600 cells/uL (ref 200–950)
Basophils Absolute: 40 cells/uL (ref 0–200)
Basophils Relative: 0.5 %
Eosinophils Absolute: 312 cells/uL (ref 15–500)
Eosinophils Relative: 3.9 %
HCT: 35.1 % (ref 35.0–45.0)
Hemoglobin: 11.7 g/dL (ref 11.7–15.5)
Lymphs Abs: 2368 cells/uL (ref 850–3900)
MCH: 30.5 pg (ref 27.0–33.0)
MCHC: 33.3 g/dL (ref 32.0–36.0)
MCV: 91.4 fL (ref 80.0–100.0)
MPV: 11.2 fL (ref 7.5–12.5)
Monocytes Relative: 7.5 %
Neutro Abs: 4680 cells/uL (ref 1500–7800)
Neutrophils Relative %: 58.5 %
Platelets: 235 10*3/uL (ref 140–400)
RBC: 3.84 10*6/uL (ref 3.80–5.10)
RDW: 13.6 % (ref 11.0–15.0)
Total Lymphocyte: 29.6 %
WBC: 8 10*3/uL (ref 3.8–10.8)

## 2022-04-04 LAB — URINALYSIS, ROUTINE W REFLEX MICROSCOPIC
Bilirubin Urine: NEGATIVE
Glucose, UA: NEGATIVE
Hgb urine dipstick: NEGATIVE
Ketones, ur: NEGATIVE
Leukocytes,Ua: NEGATIVE
Nitrite: NEGATIVE
Protein, ur: NEGATIVE
Specific Gravity, Urine: 1.007 (ref 1.001–1.035)
pH: <=5 (ref 5.0–8.0)

## 2022-04-04 LAB — COMPLETE METABOLIC PANEL WITH GFR
AG Ratio: 2.1 (calc) (ref 1.0–2.5)
ALT: 13 U/L (ref 6–29)
AST: 19 U/L (ref 10–35)
Albumin: 4.1 g/dL (ref 3.6–5.1)
Alkaline phosphatase (APISO): 88 U/L (ref 37–153)
BUN/Creatinine Ratio: 24 (calc) — ABNORMAL HIGH (ref 6–22)
BUN: 46 mg/dL — ABNORMAL HIGH (ref 7–25)
CO2: 25 mmol/L (ref 20–32)
Calcium: 9.2 mg/dL (ref 8.6–10.4)
Chloride: 103 mmol/L (ref 98–110)
Creat: 1.93 mg/dL — ABNORMAL HIGH (ref 0.60–1.00)
Globulin: 2 g/dL (calc) (ref 1.9–3.7)
Glucose, Bld: 90 mg/dL (ref 65–99)
Potassium: 5 mmol/L (ref 3.5–5.3)
Sodium: 137 mmol/L (ref 135–146)
Total Bilirubin: 0.7 mg/dL (ref 0.2–1.2)
Total Protein: 6.1 g/dL (ref 6.1–8.1)
eGFR: 26 mL/min/{1.73_m2} — ABNORMAL LOW (ref >=60)

## 2022-04-04 LAB — LIPID PANEL
Cholesterol: 156 mg/dL (ref <200)
HDL: 40 mg/dL — ABNORMAL LOW (ref >=50)
LDL Cholesterol (Calc): 87 mg/dL (calc)
Non-HDL Cholesterol (Calc): 116 mg/dL (calc) (ref <130)
Total CHOL/HDL Ratio: 3.9 (calc) (ref <5.0)
Triglycerides: 203 mg/dL — ABNORMAL HIGH (ref <150)

## 2022-04-04 LAB — PTH, INTACT AND CALCIUM
Calcium: 9.2 mg/dL (ref 8.6–10.4)
PTH: 51 pg/mL (ref 16–77)

## 2022-04-04 LAB — INSULIN, RANDOM: Insulin: 15.3 u[IU]/mL

## 2022-04-04 LAB — HEMOGLOBIN A1C
Hgb A1c MFr Bld: 5.6 % of total Hgb (ref <5.7)
Mean Plasma Glucose: 114 mg/dL
eAG (mmol/L): 6.3 mmol/L

## 2022-04-04 LAB — MICROALBUMIN / CREATININE URINE RATIO
Creatinine, Urine: 30 mg/dL (ref 20–275)
Microalb Creat Ratio: 30 mcg/mg creat — ABNORMAL HIGH (ref <30)
Microalb, Ur: 0.9 mg/dL

## 2022-04-04 LAB — MAGNESIUM: Magnesium: 2.4 mg/dL (ref 1.5–2.5)

## 2022-04-04 LAB — URIC ACID: Uric Acid, Serum: 3.4 mg/dL (ref 2.5–7.0)

## 2022-04-04 LAB — VITAMIN D 25 HYDROXY (VIT D DEFICIENCY, FRACTURES): Vit D, 25-Hydroxy: 57 ng/mL (ref 30–100)

## 2022-04-04 LAB — TSH: TSH: 1.68 mIU/L (ref 0.40–4.50)

## 2022-04-04 NOTE — Progress Notes (Signed)
<><><><><><><><><><><><><><><><><><><><><><><><><><><><><><><><><> ?<><><><><><><><><><><><><><><><><><><><><><><><><><><><><><><><><> ? ?-   Kidney functions  - BUN & Creatinine  are a little better / GFR is stable  ?<><><><><><><><><><><><><><><><><><><><><><><><><><><><><><><><><> ? ?- Total Chol = 156   &   LDL Chol = 87    Excellent  ? ?- Very low risk for Heart Attack  / Stroke ?<><><><><><><><><><><><><><><><><><><><><><><><><><><><><><><><><> ? ?- Uric Acid  / Gout test is Normal / Great ! - Please continue Allopurinol ?<><><><><><><><><><><><><><><><><><><><><><><><><><><><><><><><><> ? ?-  PTH = hormone that regulates calcium balance is Normal & OK  ?<><><><><><><><><><><><><><><><><><><><><><><><><><><><><><><><><> ? ?- A1c - Normal - No Diabetes   - Great ! ?<><><><><><><><><><><><><><><><><><><><><><><><><><><><><><><><><> ? ?- Vitamin D = 73 - Great - Please keep dose same ?<><><><><><><><><><><><><><><><><><><><><><><><><><><><><><><><><> ? ?- All Else - CBC - Electrolytes - Liver - Magnesium & Thyroid   ? ?- all  Normal / OK ?<><><><><><><><><><><><><><><><><><><><><><><><><><><><><><><><><> ? ?- Keep up the [place] Work  ! ?<><><><><><><><><><><><><><><><><><><><><><><><><><><><><><><><><> ? ? ? ? ? ? ? ? ? ?

## 2022-04-05 ENCOUNTER — Other Ambulatory Visit: Payer: Self-pay | Admitting: Internal Medicine

## 2022-04-05 DIAGNOSIS — Z1231 Encounter for screening mammogram for malignant neoplasm of breast: Secondary | ICD-10-CM

## 2022-05-09 ENCOUNTER — Ambulatory Visit
Admission: RE | Admit: 2022-05-09 | Discharge: 2022-05-09 | Disposition: A | Discharge status: Home / Self Care | Payer: Medicare Other | Source: Ambulatory Visit | Attending: Internal Medicine | Admitting: Internal Medicine

## 2022-05-09 DIAGNOSIS — Z1231 Encounter for screening mammogram for malignant neoplasm of breast: Secondary | ICD-10-CM | POA: Diagnosis not present

## 2022-05-17 ENCOUNTER — Encounter: Payer: Self-pay | Admitting: Internal Medicine

## 2022-06-06 ENCOUNTER — Other Ambulatory Visit: Payer: Self-pay | Admitting: Internal Medicine

## 2022-06-06 DIAGNOSIS — E1140 Type 2 diabetes mellitus with diabetic neuropathy, unspecified: Secondary | ICD-10-CM

## 2022-06-06 MED ORDER — GABAPENTIN 100 MG PO CAPS: ORAL_CAPSULE | ORAL | 3 refills | Status: DC

## 2022-06-11 ENCOUNTER — Other Ambulatory Visit: Payer: Self-pay | Admitting: Adult Health

## 2022-06-18 ENCOUNTER — Other Ambulatory Visit: Payer: Self-pay | Admitting: Adult Health

## 2022-06-18 ENCOUNTER — Other Ambulatory Visit: Payer: Self-pay | Admitting: Internal Medicine

## 2022-06-18 DIAGNOSIS — I10 Essential (primary) hypertension: Secondary | ICD-10-CM

## 2022-07-16 ENCOUNTER — Ambulatory Visit: Payer: Medicare Other | Admitting: Nurse Practitioner

## 2022-07-16 DIAGNOSIS — H353111 Nonexudative age-related macular degeneration, right eye, early dry stage: Secondary | ICD-10-CM | POA: Diagnosis not present

## 2022-07-16 DIAGNOSIS — H52223 Regular astigmatism, bilateral: Secondary | ICD-10-CM | POA: Diagnosis not present

## 2022-07-18 ENCOUNTER — Encounter: Payer: Self-pay | Admitting: Nurse Practitioner

## 2022-07-18 ENCOUNTER — Ambulatory Visit (INDEPENDENT_AMBULATORY_CARE_PROVIDER_SITE_OTHER): Payer: Medicare Other | Admitting: Nurse Practitioner

## 2022-07-18 VITALS — BP 128/76 | HR 53 | Temp 97.9°F | Resp 16 | Ht 63.0 in | Wt 179.4 lb

## 2022-07-18 DIAGNOSIS — D649 Anemia, unspecified: Secondary | ICD-10-CM | POA: Diagnosis not present

## 2022-07-18 DIAGNOSIS — M159 Polyosteoarthritis, unspecified: Secondary | ICD-10-CM | POA: Diagnosis not present

## 2022-07-18 DIAGNOSIS — Z0001 Encounter for general adult medical examination with abnormal findings: Secondary | ICD-10-CM | POA: Diagnosis not present

## 2022-07-18 DIAGNOSIS — M150 Primary generalized (osteo)arthritis: Secondary | ICD-10-CM

## 2022-07-18 DIAGNOSIS — E1122 Type 2 diabetes mellitus with diabetic chronic kidney disease: Secondary | ICD-10-CM

## 2022-07-18 DIAGNOSIS — E1169 Type 2 diabetes mellitus with other specified complication: Secondary | ICD-10-CM

## 2022-07-18 DIAGNOSIS — Z79899 Other long term (current) drug therapy: Secondary | ICD-10-CM

## 2022-07-18 DIAGNOSIS — I10 Essential (primary) hypertension: Secondary | ICD-10-CM | POA: Diagnosis not present

## 2022-07-18 DIAGNOSIS — K589 Irritable bowel syndrome without diarrhea: Secondary | ICD-10-CM

## 2022-07-18 DIAGNOSIS — E559 Vitamin D deficiency, unspecified: Secondary | ICD-10-CM | POA: Diagnosis not present

## 2022-07-18 DIAGNOSIS — M1000 Idiopathic gout, unspecified site: Secondary | ICD-10-CM | POA: Diagnosis not present

## 2022-07-18 DIAGNOSIS — E669 Obesity, unspecified: Secondary | ICD-10-CM

## 2022-07-18 DIAGNOSIS — N184 Chronic kidney disease, stage 4 (severe): Secondary | ICD-10-CM | POA: Diagnosis not present

## 2022-07-18 DIAGNOSIS — K219 Gastro-esophageal reflux disease without esophagitis: Secondary | ICD-10-CM | POA: Diagnosis not present

## 2022-07-18 DIAGNOSIS — M5136 Other intervertebral disc degeneration, lumbar region: Secondary | ICD-10-CM

## 2022-07-18 DIAGNOSIS — E785 Hyperlipidemia, unspecified: Secondary | ICD-10-CM | POA: Diagnosis not present

## 2022-07-18 DIAGNOSIS — R6889 Other general symptoms and signs: Secondary | ICD-10-CM

## 2022-07-18 DIAGNOSIS — Z0000 Encounter for general adult medical examination without abnormal findings: Secondary | ICD-10-CM

## 2022-07-18 DIAGNOSIS — N2581 Secondary hyperparathyroidism of renal origin: Secondary | ICD-10-CM

## 2022-07-18 DIAGNOSIS — E66811 Obesity, class 1: Secondary | ICD-10-CM

## 2022-07-18 DIAGNOSIS — M51369 Other intervertebral disc degeneration, lumbar region without mention of lumbar back pain or lower extremity pain: Secondary | ICD-10-CM

## 2022-07-18 NOTE — Patient Instructions (Signed)

## 2022-07-18 NOTE — Progress Notes (Signed)
MEDICARE ANNUAL WELLNESS VISIT AND FOLLOW UP  Assessment:   Diagnoses and all orders for this visit:  Encounter for Medicare annual wellness exam Due annually  Essential hypertension Discussed DASH (Dietary Approaches to Stop Hypertension) DASH diet is lower in sodium than a typical American diet. Cut back on foods that are high in saturated fat, cholesterol, and trans fats. Eat more whole-grain foods, fish, poultry, and nuts Remain active and exercise as tolerated daily.  Monitor BP at home-Call if greater than 130/80.  Check CMP/CBC  Irritable bowel syndrome, unspecified type Symptoms stable with lifestyle modification  Gastroesophageal reflux disease, esophagitis presence not specified Continue Prevacid PRN. No suspected reflux complications ([place]/stricture). Lifestyle modification:  wt loss, avoid meals 2-3h before bedtime. Consider eliminating food triggers:  chocolate, caffeine, EtOH, acid/spicy food.   Primary osteoarthritis involving multiple joints Mild, L>R Uses tylenol, gabapentin, voltaren PRN Continue follow up with ortho as needed  CKD (chronic kidney disease) stage 4, GFR 15-29 ml/min (HCC) Continue bASA, statin Discussed how what you eat and drink can aide in kidney protection. Stay well hydrated. Avoid high salt foods. Avoid NSAIDS. Keep BP and BG well controlled.   Take medications as prescribed. Remain active and exercise as tolerated daily. Maintain weight.  Continue to monitor. Check CMP/GFR/Microablumin Continue follow up with nephrology- Dr. Posey Pronto  Vitamin D deficiency Continue low dose as recommended by nephrology  Continue to monitor  Medication management All medications discussed and reviewed in full. All questions and concerns regarding medications addressed.    Mixed hyperlipidemia Discussed lifestyle modifications. Recommended diet heavy in fruits and veggies, omega 3's. Decrease consumption of animal meats, cheeses, and dairy  products. Remain active and exercise as tolerated. Continue to monitor. Check lipids/TSH   Idiopathic gout, unspecified chronicity, unspecified site Continue allopurinol Monitor GFR Discussed low purine diet.  Obesity (BMI 30.0-34.9) Discussed appropriate BMI Goal of losing 1 lb per month. Diet modification. Physical activity. Encouraged/praised to build confidence.  Type 2 diabetes mellitus with stage 4 chronic kidney disease, without long-term current use of insulin Education: Reviewed 'ABCs' of diabetes management  Discussed goals to be met and/or maintained include A1C (<7) Blood pressure (<130/80) Cholesterol (LDL <70) Continue Eye Exam yearly  Continue Dental Exam Q6 mo Discussed dietary recommendations Discussed Physical Activity recommendations Foot exam UTD Check A1C   Secondary hyperparathyroidism of renal origin ([place]) Off of calcium supplement; low dose vitamin D - 2000 units only; improved; monitoring only; nephrology(Dr. Posey Pronto) is following  Orders Placed This Encounter  Procedures   CBC with Differential/Platelet   COMPLETE METABOLIC PANEL WITH GFR   Magnesium   Lipid panel   Hemoglobin A1c    Over 40 minutes of exam, counseling, chart review and critical decision making was performed Future Appointments  Date Time Provider [place]  04/08/2023  3:00 PM Unk Pinto, MD [facility] None  07/19/2023 11:00 AM Darrol Jump, NP [facility] None     Plan:   During the course of the visit the patient was educated and counseled about appropriate screening and preventive services including:   Pneumococcal vaccine  Prevnar 13 Influenza vaccine Td vaccine Screening electrocardiogram Bone densitometry screening Colorectal cancer screening Diabetes screening Glaucoma screening Nutrition counseling  Advanced directives: requested   Subjective:  Kelly Cantu is a 81 y.o. female who presents for Medicare Annual Wellness Visit and 3  month follow up.   Overall she reports feeling well today.  She had no additional concerns.    Patient has hx/o GERD controlled by  lifestyle and meds (famotidine).   She is followed by Dr. 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   She has some aching intermittently in knees feet and hands, takes tylenol, gabapentin 100 mg TID, voltaren topical with benefit. Some left knee pain which is intermittent controlled with Tylenol and Voltaren gel  BMI is Body mass index is 31.78 kg/m., she has been working on diet, has not been exercising currently but knows she needs to start Wt Readings from Last 3 Encounters:  07/18/22 179 lb 6.4 oz (81.4 kg)  04/03/22 178 lb (80.7 kg)  10/17/21 189 lb (85.7 kg)    Her blood pressure has been controlled at home, today their BP is BP: 128/76.  She does not workout. She denies chest pain, shortness of breath, dizziness.   She is on cholesterol medication (atorvastatin 20 mg daily)  and denies myalgias. Her LDL cholesterol is at goal; triglycerides remain elevated. The cholesterol last visit was:   Lab Results  Component Value Date   CHOL 156 04/03/2022   HDL 40 (L) 04/03/2022   LDLCALC 87 04/03/2022   TRIG 203 (H) 04/03/2022   CHOLHDL 3.9 04/03/2022    She has been working on diet for lifestyle controlled T2DM (A1C 6.6% and 6.5% in 2017), and denies foot ulcerations, increased appetite, nausea, paresthesia of the feet, polydipsia, polyuria, visual disturbances, vomiting and weight loss. Last A1C in the office was:  Lab Results  Component Value Date   HGBA1C 5.6 04/03/2022   She has CKD 4 followed by nephrology Dr. Posey Pronto, also follows secondary hyperparathyroid of renal origin. Drinks 4+ bottles of water daily. Last GFR: Lab Results  Component Value Date   GFRNONAA 24 (L) 03/30/2021   Patient is on vitamin D3 2000 IU per nephrology instructions:  Lab Results  Component Value Date   VD25OH 57 04/03/2022     Patient is on allopurinol for gout and does  not report a recent flare.  Lab Results  Component Value Date   LABURIC 3.4 04/03/2022     Medication Review: Current Outpatient Medications on File Prior to Visit  Medication Sig Dispense Refill   allopurinol (ZYLOPRIM) 300 MG tablet TAKE 1 TABLET BY MOUTH EVERY DAY 90 tablet 99   atenolol (TENORMIN) 50 MG tablet TAKE 1 TABLET DAILY FOR BLOOD PRESSURE 90 tablet 3   atorvastatin (LIPITOR) 20 MG tablet Take 1 tablet (20 mg total) by mouth daily. 30 tablet 11   BABY ASPIRIN PO Take 81 mg by mouth daily.     Cholecalciferol (VITAMIN D) 50 MCG (2000 UT) CAPS Take 1 capsule by mouth daily.     diclofenac sodium (VOLTAREN) 1 % GEL Apply 4 g topically 4 (four) times daily as needed. 500 g 6   famotidine (PEPCID) 20 MG tablet TAKE 1 TABLET TWICE A DAY AS NEEDED FOR REFLUX 180 tablet 3   furosemide (LASIX) 40 MG tablet TAKE 1 TABLET DAILY FOR BLOOD PRESSURE AND FLUID 90 tablet 3   gabapentin (NEURONTIN) 100 MG capsule TAKE 1 CAPSULE THREE TIMES A DAY AS NEEDED FOR PAINFUL PERIPHERAL NEUROPATHY 270 capsule 3   Multiple Vitamins-Minerals (ICAPS AREDS 2 PO) Take 1 capsule by mouth daily.     Multiple Vitamins-Minerals (MULTIVITAMIN PO) Take by mouth daily.     Omega-3 Fatty Acids (FISH OIL PO) Take by mouth daily.     valsartan (DIOVAN) 160 MG tablet TAKE 1 TABLET AT NIGHT FOR BLOOD PRESSURE 90 tablet 3   No current facility-administered medications on  file prior to visit.    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[place]) 04/03/2022   FHx: heart disease 07/31/2018   Former smoker 07/31/2018   Type 2 diabetes mellitus with stage 4 chronic kidney disease, without long-term current use of insulin ([place]) 07/31/2018   Secondary hyperparathyroidism of renal origin ([place]) 04/10/2018   Screening for heart disease 11/13/2015    Obesity (BMI 30.0-34.9) 11/11/2015   Idiopathic gout 04/11/2015   Vitamin D deficiency 09/29/2014   Medication management 09/29/2014   CKD (chronic kidney disease) stage 4, GFR 15-29 ml/min ([place]) 07/22/2014   Hyperlipidemia associated with type 2 diabetes mellitus ([place])    Essential hypertension    GERD (gastroesophageal reflux disease)    DDD (degenerative disc disease), lumbar    IBS (irritable bowel syndrome)     Screening Tests Immunization History  Administered Date(s) Administered   DT (Pediatric) 09/29/2014, 09/20/2015   Influenza Split 09/28/2013   Influenza, High Dose Seasonal PF 10/13/2014, 09/03/2016, 10/01/2017, 11/03/2018, 10/10/2020, 10/17/2021   Influenza-Unspecified 08/04/2015   PFIZER SARS-COV-2 Pediatric Vaccination 5-66yrs 10/07/2020   PFIZER(Purple Top)SARS-COV-2 Vaccination 01/07/2020, 02/01/2020   Pneumococcal Conjugate-13 10/13/2014   Pneumococcal Polysaccharide-23 09/23/2012   Td 09/02/2004   Zoster, Live 10/13/2013   Preventative care: Last colonoscopy: 2015, DONE due to age unless problems Last mammogram: 05/2022 negative repeat 1 year Last pap smear/pelvic exam: remote, s/p abd hysterectomy   DEXA:2011, 07/2018 T 0.4 normal - Declines further work up.  Education provided on benefit of further screening.  Will continue to monitor.  CXR 2015 US renal 2012  Prior vaccinations: TD or Tdap: 2016 Influenza: Due 09/2022  Pneumococcal: 2013 Prevnar13: 2015 Shingles/Zostavax: 2014 Covid 19: 2/2, 2021, pfizer  Names of Other Physician/Practitioners you currently use: 1. [facility] here for primary care 2. Dr. Nicki Reaper, eye doctor, last visit 07/12/22  3. Randol Kern, dentist, last visit 01/2022 q6 months   Patient Care Team: Unk Pinto, MD as PCP - General (Internal Medicine) Gatha Mayer, MD as Consulting Physician (Gastroenterology) Macarthur Critchley, [place] as Referring Physician (Optometry) Elmarie Shiley, MD as Consulting  Physician (Nephrology) seen 12/06/2020 stable f/u 6 months  SURGICAL HISTORY She  has a past surgical history that includes Abdominal hysterectomy; Cataract extraction (Bilateral); and Colonoscopy (x 2). FAMILY HISTORY Her family history includes Cancer in her mother and sister; Heart attack in her father; Heart disease in her father; Hypertension in her sister; Liver cancer in her mother. SOCIAL HISTORY She  reports that she quit smoking about 18 years ago. Her smoking use included cigarettes. She started smoking about 63 years ago. She has a 14.85 pack-year smoking history. She has never used smokeless tobacco. She reports that she does not drink alcohol and does not use drugs.   MEDICARE WELLNESS OBJECTIVES: Physical activity: Exercise limited by: orthopedic condition(s) Cardiac risk factors: Cardiac Risk Factors include: advanced age (>17men, >72 women);diabetes mellitus;dyslipidemia;hypertension;obesity (BMI >30kg/m2) Depression/mood screen:      07/18/2022   12:32 PM  Depression screen PHQ 2/9  Decreased Interest 0  Down, Depressed, Hopeless 0  PHQ - 2 Score 0    ADLs:     07/18/2022   12:31 PM  In your present state of health, do you have any difficulty performing the following activities:  Hearing? 0  Vision?  0  Difficulty concentrating or making decisions? 0  Walking or climbing stairs? 0  Dressing or bathing? 0  Doing errands, shopping? 0  Preparing Food and eating ? N  Using the Toilet? N  In the past six months, have you accidently leaked urine? N  Do you have problems with loss of bowel control? N  Managing your Medications? N  Managing your Finances? N  Housekeeping or managing your Housekeeping? N     Cognitive Testing  Alert? Yes  Normal Appearance?Yes  Oriented to person? Yes  Place? Yes   Time? Yes  Recall of three objects?  Yes  Can perform simple calculations? Yes  Displays appropriate judgment?Yes  Can read the correct time from a watch  face?Yes  EOL planning: Does Patient Have a Medical Advance Directive?: Yes Type of Advance Directive: Living will Does patient want to make changes to medical advance directive?: No - Patient declined  Review of Systems  Constitutional:  Negative for chills, fever, malaise/fatigue and weight loss.  HENT:  Negative for congestion, hearing loss, sinus pain, sore throat and tinnitus.   Eyes:  Negative for blurred vision and double vision.  Respiratory:  Negative for cough, sputum production, shortness of breath and wheezing.   Cardiovascular:  Negative for chest pain, palpitations, orthopnea, claudication, leg swelling and PND.  Gastrointestinal:  Negative for abdominal pain, blood in stool, constipation, diarrhea, heartburn, melena, nausea and vomiting.  Genitourinary: Negative.   Musculoskeletal:  Positive for joint pain (left knee and feet). Negative for falls and myalgias.  Skin:  Negative for rash.  Neurological:  Negative for dizziness, tingling, tremors, sensory change, loss of consciousness, weakness and headaches.  Endo/Heme/Allergies:  Negative for polydipsia.  Psychiatric/Behavioral: Negative.  Negative for depression, memory loss, substance abuse and suicidal ideas. The patient is not nervous/anxious and does not have insomnia.   All other systems reviewed and are negative.   Objective:     Today's Vitals   07/18/22 1135  BP: 128/76  Pulse: (!) 53  Resp: 16  Temp: 97.9 F (36.6 C)  SpO2: 98%  Weight: 179 lb 6.4 oz (81.4 kg)  Height: $Remove'5\' 3"'[id number]$  (1.6 m)    Body mass index is 31.78 kg/m.  General appearance: alert, no distress, WD/WN, female HEENT: normocephalic, sclerae anicteric, TMs pearly, nares patent, no discharge or erythema, pharynx normal Oral cavity: MMM, no lesions Neck: supple, no lymphadenopathy, no thyromegaly, no masses Heart: RRR, normal S1, S2, no murmurs Lungs: CTA bilaterally, no wheezes, rhonchi, or rales Abdomen: +bs, soft, non tender, non  distended, no masses, no hepatomegaly, no splenomegaly Musculoskeletal: nontender, no swelling, no obvious deformity Extremities: no edema, no cyanosis, no clubbing Pulses: 2+ symmetric, upper and lower extremities, normal cap refill Neurological: alert, oriented x 3, CN2-12 intact, strength normal upper extremities and lower extremities,, DTRs 2+ throughout, no cerebellar signs, gait normal, sensation intact Psychiatric: normal affect, behavior normal, pleasant   Medicare Attestation I have personally reviewed: The patient's medical and social history Their use of alcohol, tobacco or illicit drugs Their current medications and supplements The patient's functional ability including ADLs,fall risks, home safety risks, cognitive, and hearing and visual impairment Diet and physical activities Evidence for depression or mood disorders  The patient's weight, height, BMI, and visual acuity have been recorded in the chart.  I have made referrals, counseling, and provided education to the patient based on review of the above and I have provided the patient with a written personalized care plan for preventive services.  Darrol Jump NP [facility]  07/18/2022

## 2022-07-19 ENCOUNTER — Other Ambulatory Visit: Payer: Self-pay | Admitting: Nurse Practitioner

## 2022-07-19 DIAGNOSIS — D649 Anemia, unspecified: Secondary | ICD-10-CM

## 2022-07-19 DIAGNOSIS — E875 Hyperkalemia: Secondary | ICD-10-CM

## 2022-07-19 LAB — COMPLETE METABOLIC PANEL WITH GFR
AG Ratio: 2.1 (calc) (ref 1.0–2.5)
ALT: 11 U/L (ref 6–29)
AST: 16 U/L (ref 10–35)
Albumin: 3.9 g/dL (ref 3.6–5.1)
Alkaline phosphatase (APISO): 80 U/L (ref 37–153)
BUN/Creatinine Ratio: 29 (calc) — ABNORMAL HIGH (ref 6–22)
BUN: 56 mg/dL — ABNORMAL HIGH (ref 7–25)
CO2: 24 mmol/L (ref 20–32)
Calcium: 9.3 mg/dL (ref 8.6–10.4)
Chloride: 110 mmol/L (ref 98–110)
Creat: 1.94 mg/dL — ABNORMAL HIGH (ref 0.60–1.00)
Globulin: 1.9 g/dL (calc) (ref 1.9–3.7)
Glucose, Bld: 109 mg/dL — ABNORMAL HIGH (ref 65–99)
Potassium: 5.6 mmol/L — ABNORMAL HIGH (ref 3.5–5.3)
Sodium: 142 mmol/L (ref 135–146)
Total Bilirubin: 0.6 mg/dL (ref 0.2–1.2)
Total Protein: 5.8 g/dL — ABNORMAL LOW (ref 6.1–8.1)
eGFR: 26 mL/min/{1.73_m2} — ABNORMAL LOW (ref >=60)

## 2022-07-19 LAB — CBC WITH DIFFERENTIAL/PLATELET
Absolute Monocytes: 648 cells/uL (ref 200–950)
Basophils Absolute: 40 cells/uL (ref 0–200)
Basophils Relative: 0.5 %
Eosinophils Absolute: 261 cells/uL (ref 15–500)
Eosinophils Relative: 3.3 %
HCT: 31.9 % — ABNORMAL LOW (ref 35.0–45.0)
Hemoglobin: 10.3 g/dL — ABNORMAL LOW (ref 11.7–15.5)
Lymphs Abs: 2054 cells/uL (ref 850–3900)
MCH: 30.7 pg (ref 27.0–33.0)
MCHC: 32.3 g/dL (ref 32.0–36.0)
MCV: 95.2 fL (ref 80.0–100.0)
MPV: 11.2 fL (ref 7.5–12.5)
Monocytes Relative: 8.2 %
Neutro Abs: 4898 cells/uL (ref 1500–7800)
Neutrophils Relative %: 62 %
Platelets: 207 10*3/uL (ref 140–400)
RBC: 3.35 10*6/uL — ABNORMAL LOW (ref 3.80–5.10)
RDW: 14.6 % (ref 11.0–15.0)
Total Lymphocyte: 26 %
WBC: 7.9 10*3/uL (ref 3.8–10.8)

## 2022-07-19 LAB — TEST AUTHORIZATION

## 2022-07-19 LAB — LIPID PANEL
Cholesterol: 149 mg/dL (ref <200)
HDL: 43 mg/dL — ABNORMAL LOW (ref >=50)
LDL Cholesterol (Calc): 81 mg/dL (calc)
Non-HDL Cholesterol (Calc): 106 mg/dL (calc) (ref <130)
Total CHOL/HDL Ratio: 3.5 (calc) (ref <5.0)
Triglycerides: 155 mg/dL — ABNORMAL HIGH (ref <150)

## 2022-07-19 LAB — IRON,TIBC AND FERRITIN PANEL
%SAT: 22 % (calc) (ref 16–45)
Ferritin: 37 ng/mL (ref 16–288)
Iron: 57 ug/dL (ref 45–160)
TIBC: 256 mcg/dL (calc) (ref 250–450)

## 2022-07-19 LAB — HEMOGLOBIN A1C
Hgb A1c MFr Bld: 5.4 % of total Hgb (ref <5.7)
Mean Plasma Glucose: 108 mg/dL
eAG (mmol/L): 6 mmol/L

## 2022-07-19 LAB — MAGNESIUM: Magnesium: 2.3 mg/dL (ref 1.5–2.5)

## 2022-07-30 NOTE — Progress Notes (Signed)
[facility] Note  08/03/2022     CHIEF COMPLAINT Patient presents for Retina Evaluation   HISTORY OF PRESENT ILLNESS: Kelly Cantu is a 81 y.o. female who presents to the clinic today for:   HPI     Retina Evaluation   In both eyes.  This started months ago.  Associated Symptoms Negative for Flashes, Floaters and Distortion.  I, the attending physician,  performed the HPI with the patient and updated documentation appropriately.        Comments   Patient is here today based on a referral from Dr. Nicki Cantu for AMD in both eyes. Patient has no complaints at this time.       Last edited by Kelly Caffey, MD on 08/03/2022 12:40 PM.    Pt is here on the referral of Dr. Nicki Cantu for concern of non-exu ARMD OU, pt states she has been a pt of his for about 15 years, she states he thought her ARMD looked worse the last time he saw her and wanted her to see a retina specialist, pt takes AREDS2, but does not monitor her vision on an amsler grid, she has no complaints about her vision, pt has had cataract sx with Dr. Talbert Cantu, pt endorses having high BP, but no diabetes  Referring physician: Macarthur Cantu, [address] [place],  [place] [id number]  HISTORICAL INFORMATION:   Selected notes from the MEDICAL RECORD NUMBER Referred by Dr. Macarthur Cantu for concern of ARMD LEE:  Ocular Hx- PMH-    CURRENT MEDICATIONS: No current outpatient medications on file. (Ophthalmic Drugs)   No current facility-administered medications for this visit. (Ophthalmic Drugs)   Current Outpatient Medications (Other)  Medication Sig   allopurinol (ZYLOPRIM) 300 MG tablet TAKE 1 TABLET BY MOUTH EVERY DAY   atenolol (TENORMIN) 50 MG tablet TAKE 1 TABLET DAILY FOR BLOOD PRESSURE   atorvastatin (LIPITOR) 20 MG tablet Take 1 tablet (20 mg total) by mouth daily.   BABY ASPIRIN PO Take 81 mg by mouth daily.   Cholecalciferol (VITAMIN D) 50 MCG (2000 UT) CAPS Take 1 capsule by mouth  daily.   diclofenac sodium (VOLTAREN) 1 % GEL Apply 4 g topically 4 (four) times daily as needed.   famotidine (PEPCID) 20 MG tablet TAKE 1 TABLET TWICE A DAY AS NEEDED FOR REFLUX   furosemide (LASIX) 40 MG tablet TAKE 1 TABLET DAILY FOR BLOOD PRESSURE AND FLUID   gabapentin (NEURONTIN) 100 MG capsule TAKE 1 CAPSULE THREE TIMES A DAY AS NEEDED FOR PAINFUL PERIPHERAL NEUROPATHY   Multiple Vitamins-Minerals (ICAPS AREDS 2 PO) Take 1 capsule by mouth daily.   Multiple Vitamins-Minerals (MULTIVITAMIN PO) Take by mouth daily.   Omega-3 Fatty Acids (FISH OIL PO) Take by mouth daily.   valsartan (DIOVAN) 160 MG tablet TAKE 1 TABLET AT NIGHT FOR BLOOD PRESSURE   No current facility-administered medications for this visit. (Other)   REVIEW OF SYSTEMS: ROS   Positive for: Eyes Last edited by Kelly Caffey, MD on 08/03/2022 12:40 PM.     ALLERGIES Allergies  Allergen Reactions   Ace Inhibitors     Pt unsure of reaction   Feldene [Piroxicam] Rash   PAST MEDICAL HISTORY Past Medical History:  Diagnosis Date   Cataract    Chronic kidney disease    DDD (degenerative disc disease), lumbar    DJD (degenerative joint disease)    GERD (gastroesophageal reflux disease)    Hyperlipidemia    Hypertension  IBS (irritable bowel syndrome)    Prediabetes    Past Surgical History:  Procedure Laterality Date   ABDOMINAL HYSTERECTOMY     CATARACT EXTRACTION Bilateral    COLONOSCOPY  x 2   w/Dr Kelly Cantu   FAMILY HISTORY Family History  Problem Relation Age of Onset   Cancer Mother        liver   Liver cancer Mother    Heart disease Father    Heart attack Father    Hypertension Sister    Cancer Sister        kidney   Colon cancer Neg Hx    Breast cancer Neg Hx    SOCIAL HISTORY Social History   Tobacco Use   Smoking status: Former    Packs/day: 0.33    Years: 45.00    Total pack years: 14.85    Types: Cigarettes    Start date: 70    Quit date: 06/16/2004    Years since  quitting: 18.1   Smokeless tobacco: Never  Substance Use Topics   Alcohol use: No   Drug use: No       OPHTHALMIC EXAM:  Base Eye Exam     Visual Acuity (Snellen - Linear)       Right Left   Dist [place] 20/40 20/50   Dist ph [place] 20/30 +2 NI         Tonometry (Tonopen, 8:29 AM)       Right Left   Pressure 17 15         Pupils       Dark Light Shape React APD   Right 2 2 Round Minimal None   Left 2 2 Round Minimal None         Visual Fields       Left Right    Full Full         Extraocular Movement       Right Left    Full, Ortho Full, Ortho         Neuro/Psych     Oriented x3: Yes         Dilation     Both eyes: 1.0% Mydriacyl, 2.5% Phenylephrine @ 8:24 AM           Slit Lamp and Fundus Exam     Slit Lamp Exam       Right Left   Lids/Lashes Dermatochalasis - upper lid Dermatochalasis - upper lid   Conjunctiva/Sclera White and quiet White and quiet   Cornea 1+ Punctate epithelial erosions, trace tear film debris, well healed cataract wound 1+ Punctate epithelial erosions, trace tear film debris, well healed cataract wound   Anterior Chamber deep and clear deep and clear   Iris Round and reactive Round and reactive   Lens PC IOL in good position with open PC PC IOL in good position with open PC   Anterior Vitreous mild syneresis mild syneresis, Posterior vitreous detachment         Fundus Exam       Right Left   Disc mild Pallor, Sharp rim, mild PPA 1-2 Pallor   C/D Ratio 0.4 0.5   Macula Flat, Blunted foveal reflex, Drusen, RPE mottling and clumping Flat, Blunted foveal reflex, fine Drusen, RPE mottling and clumping, central vitelliform like lesion, No heme or edema   Vessels mild attenuation, mild tortuosity attenuated, Tortuous   Periphery Attached, reticular degeneration, No heme Attached, reticular degeneration, No heme  Refraction     Manifest Refraction       Sphere Dist [place]   Right [place] 20/40   Left     Unable to improve          IMAGING AND PROCEDURES  Imaging and Procedures for 08/03/2022  OCT, Retina - OU - Both Eyes       Right Eye Quality was good. Central Foveal Thickness: 279. Progression has no prior data. Findings include normal foveal contour, no IRF, no SRF, retinal drusen , subretinal hyper-reflective material (Central vitelliform like lesion).   Left Eye Quality was good. Central Foveal Thickness: 318. Progression has no prior data. Findings include no IRF, no SRF, abnormal foveal contour, retinal drusen , subretinal hyper-reflective material (Central vitelliform like lesion).   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 CME - cystoid macular edema. PED - pigment epithelial detachment. IRF - intraretinal fluid. SRF - subretinal fluid. EZ - ellipsoid zone. ERM - epiretinal membrane. ORA - outer retinal atrophy. ORT - outer retinal tubulation. SRHM - subretinal hyper-reflective material. IRHM - intraretinal hyper-reflective material            ASSESSMENT/PLAN:    ICD-10-CM   1. Intermediate stage nonexudative age-related macular degeneration of both eyes  H35.3132 OCT, Retina - OU - Both Eyes    2. Essential hypertension  I10     3. Hypertensive retinopathy of both eyes  H35.033     4. Pseudophakia, both eyes  Z96.1      Age related macular degeneration, non-exudative, both eyes  - intermediate stage with central vitelliform like lesions OU  - The incidence, anatomy, and pathology of dry AMD, risk of progression, and the AREDS and AREDS 2 study including smoking risks discussed with patient.  - Recommend amsler grid monitoring  - f/u 3-4 months, DFE, OCT  2,3. Hypertensive retinopathy OU - discussed importance of tight BP control - monitor  4. Pseudophakia OU  - s/p CE/IOL (Dr. Talbert Cantu)  - IOL in  good position, doing well  - monitor  Ophthalmic Meds Ordered this visit:  No orders of the defined types were placed in this encounter.    Return for f/u 3-4 months, non-exu ARMD OU, DFE, OCT.  There are no Patient Instructions on file for this visit.  Explained the diagnoses, plan, and follow up with the patient and they expressed understanding.  Patient expressed understanding of the importance of proper follow up care.   This document serves as a record of services personally performed by Gardiner Sleeper, MD, PhD. It was created on their behalf by San Jetty. Owens Shark, OA an ophthalmic technician. The creation of this record is the provider's dictation and/or activities during the visit.    Electronically signed by: San Jetty. Owens Shark, [place] 08.28.2023 12:40 PM   Gardiner Sleeper, M.D., Ph.D. Diseases & Surgery of the Retina and Vitreous [facility]  I have reviewed the above documentation for accuracy and completeness, and I agree with the above. Gardiner Sleeper, M.D., Ph.D. 08/03/22 12:42 PM   Abbreviations: M myopia (nearsighted); A astigmatism; H hyperopia (farsighted); P presbyopia; Mrx spectacle prescription;  CTL contact lenses; OD right eye; OS left eye; OU both eyes  XT exotropia; ET esotropia; PEK punctate epithelial keratitis; PEE punctate epithelial erosions; DES dry eye syndrome; MGD meibomian gland dysfunction; ATs  artificial tears; PFAT's preservative free artificial tears; [place] nuclear sclerotic cataract; PSC posterior subcapsular cataract; ERM epi-retinal membrane; PVD posterior vitreous detachment; RD retinal detachment; DM diabetes mellitus; DR diabetic retinopathy; NPDR non-proliferative diabetic retinopathy; PDR proliferative diabetic retinopathy; CSME clinically significant macular edema; DME diabetic macular edema; dbh dot blot hemorrhages; CWS cotton wool spot; POAG primary open angle glaucoma; C/D cup-to-disc ratio; HVF humphrey visual field; GVF  goldmann visual field; OCT optical coherence tomography; IOP intraocular pressure; BRVO Branch retinal vein occlusion; CRVO central retinal vein occlusion; CRAO central retinal artery occlusion; BRAO branch retinal artery occlusion; RT retinal tear; SB scleral buckle; PPV pars plana vitrectomy; VH Vitreous hemorrhage; PRP panretinal laser photocoagulation; IVK intravitreal kenalog; VMT vitreomacular traction; MH Macular hole;  NVD neovascularization of the disc; NVE neovascularization elsewhere; AREDS age related eye disease study; ARMD age related macular degeneration; POAG primary open angle glaucoma; EBMD epithelial/anterior basement membrane dystrophy; ACIOL anterior chamber intraocular lens; IOL intraocular lens; PCIOL posterior chamber intraocular lens; Phaco/IOL phacoemulsification with intraocular lens placement; [place] photorefractive keratectomy; LASIK laser assisted in situ keratomileusis; HTN hypertension; DM diabetes mellitus; COPD chronic obstructive pulmonary disease

## 2022-07-31 ENCOUNTER — Encounter (INDEPENDENT_AMBULATORY_CARE_PROVIDER_SITE_OTHER): Payer: Medicare Other | Admitting: Ophthalmology

## 2022-08-03 ENCOUNTER — Encounter (INDEPENDENT_AMBULATORY_CARE_PROVIDER_SITE_OTHER): Payer: Self-pay | Admitting: Ophthalmology

## 2022-08-03 ENCOUNTER — Ambulatory Visit (INDEPENDENT_AMBULATORY_CARE_PROVIDER_SITE_OTHER): Payer: Medicare Other | Admitting: Ophthalmology

## 2022-08-03 DIAGNOSIS — I10 Essential (primary) hypertension: Secondary | ICD-10-CM | POA: Diagnosis not present

## 2022-08-03 DIAGNOSIS — H35033 Hypertensive retinopathy, bilateral: Secondary | ICD-10-CM

## 2022-08-03 DIAGNOSIS — H353132 Nonexudative age-related macular degeneration, bilateral, intermediate dry stage: Secondary | ICD-10-CM | POA: Diagnosis not present

## 2022-08-03 DIAGNOSIS — Z961 Presence of intraocular lens: Secondary | ICD-10-CM

## 2022-08-03 DIAGNOSIS — H3581 Retinal edema: Secondary | ICD-10-CM

## 2022-08-08 DIAGNOSIS — N2581 Secondary hyperparathyroidism of renal origin: Secondary | ICD-10-CM | POA: Diagnosis not present

## 2022-08-08 DIAGNOSIS — I129 Hypertensive chronic kidney disease with stage 1 through stage 4 chronic kidney disease, or unspecified chronic kidney disease: Secondary | ICD-10-CM | POA: Diagnosis not present

## 2022-08-08 DIAGNOSIS — D631 Anemia in chronic kidney disease: Secondary | ICD-10-CM | POA: Diagnosis not present

## 2022-08-08 DIAGNOSIS — N1832 Chronic kidney disease, stage 3b: Secondary | ICD-10-CM | POA: Diagnosis not present

## 2022-09-27 ENCOUNTER — Encounter: Payer: Self-pay | Admitting: Internal Medicine

## 2022-10-18 ENCOUNTER — Encounter: Payer: Self-pay | Admitting: Nurse Practitioner

## 2022-10-18 ENCOUNTER — Ambulatory Visit (INDEPENDENT_AMBULATORY_CARE_PROVIDER_SITE_OTHER): Payer: Medicare Other | Admitting: Nurse Practitioner

## 2022-10-18 VITALS — BP 140/70 | HR 51 | Temp 97.2°F | Ht 63.0 in | Wt 178.8 lb

## 2022-10-18 DIAGNOSIS — E785 Hyperlipidemia, unspecified: Secondary | ICD-10-CM | POA: Diagnosis not present

## 2022-10-18 DIAGNOSIS — E66811 Obesity, class 1: Secondary | ICD-10-CM

## 2022-10-18 DIAGNOSIS — Z23 Encounter for immunization: Secondary | ICD-10-CM | POA: Diagnosis not present

## 2022-10-18 DIAGNOSIS — I10 Essential (primary) hypertension: Secondary | ICD-10-CM

## 2022-10-18 DIAGNOSIS — K219 Gastro-esophageal reflux disease without esophagitis: Secondary | ICD-10-CM | POA: Diagnosis not present

## 2022-10-18 DIAGNOSIS — E1169 Type 2 diabetes mellitus with other specified complication: Secondary | ICD-10-CM | POA: Diagnosis not present

## 2022-10-18 DIAGNOSIS — E669 Obesity, unspecified: Secondary | ICD-10-CM | POA: Diagnosis not present

## 2022-10-18 DIAGNOSIS — M5431 Sciatica, right side: Secondary | ICD-10-CM

## 2022-10-18 DIAGNOSIS — M1000 Idiopathic gout, unspecified site: Secondary | ICD-10-CM | POA: Diagnosis not present

## 2022-10-18 DIAGNOSIS — M159 Polyosteoarthritis, unspecified: Secondary | ICD-10-CM

## 2022-10-18 DIAGNOSIS — M150 Primary generalized (osteo)arthritis: Secondary | ICD-10-CM

## 2022-10-18 DIAGNOSIS — E559 Vitamin D deficiency, unspecified: Secondary | ICD-10-CM | POA: Diagnosis not present

## 2022-10-18 DIAGNOSIS — H01139 Eczematous dermatitis of unspecified eye, unspecified eyelid: Secondary | ICD-10-CM

## 2022-10-18 DIAGNOSIS — K589 Irritable bowel syndrome without diarrhea: Secondary | ICD-10-CM

## 2022-10-18 DIAGNOSIS — Z79899 Other long term (current) drug therapy: Secondary | ICD-10-CM

## 2022-10-18 DIAGNOSIS — N2581 Secondary hyperparathyroidism of renal origin: Secondary | ICD-10-CM

## 2022-10-18 DIAGNOSIS — N184 Chronic kidney disease, stage 4 (severe): Secondary | ICD-10-CM | POA: Diagnosis not present

## 2022-10-18 MED ORDER — PREDNISONE 10 MG PO TABS: ORAL_TABLET | ORAL | 0 refills | Status: DC

## 2022-10-18 MED ORDER — MOMETASONE FUROATE 0.1 % EX CREA: TOPICAL_CREAM | CUTANEOUS | 1 refills | Status: AC

## 2022-10-18 NOTE — Addendum Note (Signed)
Addended by: Chancy Hurter on: 10/18/2022 12:23 PM   Modules accepted: Orders

## 2022-10-18 NOTE — Progress Notes (Signed)
FOLLOW UP  Assessment:   Diagnoses and all orders for this visit:  Essential hypertension Discussed DASH (Dietary Approaches to Stop Hypertension) DASH diet is lower in sodium than a typical American diet. Cut back on foods that are high in saturated fat, cholesterol, and trans fats. Eat more whole-grain foods, fish, poultry, and nuts Remain active and exercise as tolerated daily.  Monitor BP at home-Call if greater than 130/80.  Check CMP/CBC  Irritable bowel syndrome, unspecified type Symptoms stable with lifestyle modification Stay well hydrated Continue to monitor  Gastroesophageal reflux disease, esophagitis presence not specified No suspected reflux complications ([place]/stricture). Lifestyle modification:  wt loss, avoid meals 2-3h before bedtime. Consider eliminating food triggers:  chocolate, caffeine, EtOH, acid/spicy food.   Primary osteoarthritis involving multiple joints Mild, L>R Uses tylenol, gabapentin, voltaren PRN Continue follow up with ortho as needed  CKD (chronic kidney disease) stage 4, GFR 15-29 ml/min (HCC) Discussed how what you eat and drink can aide in kidney protection. Stay well hydrated. Avoid high salt foods. Avoid NSAIDS. Keep BP and BG well controlled.   Take medications as prescribed. Remain active and exercise as tolerated daily. Maintain weight.  Continue to monitor. Check CMP/GFR/Microablumin Continue follow up with nephrology- Dr. Posey Pronto  Vitamin D deficiency Continue low dose as recommended by nephrology  Continue to monitor  Medication management All medications discussed and reviewed in full. All questions and concerns regarding medications addressed.    Mixed hyperlipidemia Discussed lifestyle modifications. Recommended diet heavy in fruits and veggies, omega 3's. Decrease consumption of animal meats, cheeses, and dairy products. Remain active and exercise as tolerated. Continue to monitor. Check  lipids/TSH   Idiopathic gout, unspecified chronicity, unspecified site Continue allopurinol Monitor GFR Discussed low purine diet.  Obesity (BMI 30.0-34.9) Discussed appropriate BMI Goal of losing 1 lb per month. Diet modification. Physical activity. Encouraged/praised to build confidence.  Secondary hyperparathyroidism of renal origin ([place]) Off of calcium supplement; low dose vitamin D - 2000 units only; improved; monitoring only; nephrology(Dr. Posey Pronto) is following  Right side sciatica Start Prednisone taper Discussed beneficial exercises/stretching Rest  Eyelid Eczema Start topical Elocon Continue to wash eye with baby shampoo Keep eyelids well moisturized once healed  Orders Placed This Encounter  Procedures   CBC with Differential/Platelet   COMPLETE METABOLIC PANEL WITH GFR   Lipid panel   Hemoglobin A1c   Meds ordered this encounter  Medications   predniSONE (DELTASONE) 10 MG tablet    Sig: 1 tab 3 x day for 2 days, then 1 tab 2 x day for 2 days, then 1 tab 1 x day for 3 days    Dispense:  13 tablet    Refill:  0    Order Specific Question:   Supervising Provider    Answer:   Unk Pinto [[id number]]   mometasone (ELOCON) 0.1 % cream    Sig: Apply to affected area daily    Dispense:  45 g    Refill:  1    Order Specific Question:   Supervising Provider    Answer:   Unk Pinto [phone]     Notify office for further evaluation and treatment, questions or concerns if any reported s/s fail to improve.   The patient was advised to call back or seek an in-person evaluation if any symptoms worsen or if the condition fails to improve as anticipated.   Further disposition pending results of labs. Discussed med's effects and SE's.    I discussed the assessment and treatment plan with  the patient. The patient was provided an opportunity to ask questions and all were answered. The patient agreed with the plan and demonstrated an understanding of the  instructions.  Discussed med's effects and SE's. Screening labs and tests as requested with regular follow-up as recommended.  I provided 25 minutes of face-to-face time during this encounter including counseling, chart review, and critical decision making was preformed.  Future Appointments  Date Time Provider [place]  11/30/2022  2:00 PM Bernarda Caffey, MD [facility] None  04/08/2023  3:00 PM Unk Pinto, MD [facility] None  07/19/2023 11:00 AM Darrol Jump, NP [facility] None    Subjective:  Kelly Cantu is a 81 y.o. female who presents for 3 month follow up.   Overall she reports feeling well today.   She reports an increase in right side sciatica flare.  She has been taking Gabapentin, Tylenol and using Voltaren without any relief.     She also notes irritated eyelids, she has been using baby shampoo to clean with and applying Oil of Olay moisturizing cream without benefit.  She continues to notice mildly red eyelids that are itchy and flaky in the mornings.   Patient has hx/o GERD controlled by lifestyle and meds (famotidine).   She is followed by Dr. 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   She has some aching intermittently in knees feet and hands, takes tylenol, gabapentin 100 mg TID, voltaren topical with benefit. Some left knee pain which is intermittent controlled with Tylenol and Voltaren gel  BMI is Body mass index is 31.67 kg/m., she has been working on diet, has not been exercising Wt Readings from Last 3 Encounters:  10/18/22 178 lb 12.8 oz (81.1 kg)  07/18/22 179 lb 6.4 oz (81.4 kg)  04/03/22 178 lb (80.7 kg)    Her blood pressure has been controlled at home, today their BP is BP: (!) 140/70.  She does not workout. She denies chest pain, shortness of breath, dizziness.   She is on cholesterol medication (atorvastatin 20 mg daily)  and denies myalgias. Her LDL cholesterol is at goal; triglycerides remain elevated. The cholesterol last visit was:   Lab  Results  Component Value Date   CHOL 149 07/18/2022   HDL 43 (L) 07/18/2022   LDLCALC 81 07/18/2022   TRIG 155 (H) 07/18/2022   CHOLHDL 3.5 07/18/2022    She has been working on diet for lifestyle controlled T2DM (A1C 6.6% and 6.5% in 2017), and denies foot ulcerations, increased appetite, nausea, paresthesia of the feet, polydipsia, polyuria, visual disturbances, vomiting and weight loss. Last A1C in the office was:  Lab Results  Component Value Date   HGBA1C 5.4 07/18/2022   She has CKD 4 followed by nephrology Dr. Posey Pronto, last f/u 08/2022 also follows secondary hyperparathyroid of renal origin. Has dialysis.  Drinks 4+ bottles of water daily. Last GFR: Lab Results  Component Value Date   GFRNONAA 24 (L) 03/30/2021   Patient is on vitamin D3 2000 IU per nephrology instructions:  Lab Results  Component Value Date   VD25OH 57 04/03/2022     Patient is on allopurinol for gout and does not report a recent flare.  Lab Results  Component Value Date   LABURIC 3.4 04/03/2022     Medication Review: Current Outpatient Medications on File Prior to Visit  Medication Sig Dispense Refill   allopurinol (ZYLOPRIM) 300 MG tablet TAKE 1 TABLET BY MOUTH EVERY DAY 90 tablet 99   atenolol (TENORMIN) 50 MG tablet  TAKE 1 TABLET DAILY FOR BLOOD PRESSURE 90 tablet 3   atorvastatin (LIPITOR) 20 MG tablet Take 1 tablet (20 mg total) by mouth daily. 30 tablet 11   BABY ASPIRIN PO Take 81 mg by mouth daily.     Cholecalciferol (VITAMIN D) 50 MCG (2000 UT) CAPS Take 1 capsule by mouth daily.     diclofenac sodium (VOLTAREN) 1 % GEL Apply 4 g topically 4 (four) times daily as needed. 500 g 6   famotidine (PEPCID) 20 MG tablet TAKE 1 TABLET TWICE A DAY AS NEEDED FOR REFLUX 180 tablet 3   furosemide (LASIX) 40 MG tablet TAKE 1 TABLET DAILY FOR BLOOD PRESSURE AND FLUID 90 tablet 3   gabapentin (NEURONTIN) 100 MG capsule TAKE 1 CAPSULE THREE TIMES A DAY AS NEEDED FOR PAINFUL PERIPHERAL NEUROPATHY 270 capsule 3    Multiple Vitamins-Minerals (ICAPS AREDS 2 PO) Take 1 capsule by mouth daily.     Multiple Vitamins-Minerals (MULTIVITAMIN PO) Take by mouth daily.     Omega-3 Fatty Acids (FISH OIL PO) Take by mouth daily.     valsartan (DIOVAN) 160 MG tablet TAKE 1 TABLET AT NIGHT FOR BLOOD PRESSURE 90 tablet 3   No current facility-administered medications on file prior to visit.    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[place]) 04/03/2022   FHx: heart disease 07/31/2018   Former smoker 07/31/2018   Type 2 diabetes mellitus with stage 4 chronic kidney disease, without long-term current use of insulin ([place]) 07/31/2018   Secondary hyperparathyroidism of renal origin ([place]) 04/10/2018   Screening for heart disease 11/13/2015   Obesity (BMI 30.0-34.9) 11/11/2015   Idiopathic gout 04/11/2015   Vitamin D deficiency 09/29/2014   Medication management 09/29/2014   CKD (chronic kidney disease) stage 4, GFR 15-29 ml/min ([place]) 07/22/2014   Hyperlipidemia associated with type 2 diabetes mellitus ([place])    Essential hypertension    GERD (gastroesophageal reflux disease)    DDD (degenerative disc disease), lumbar    IBS (irritable bowel syndrome)     Screening Tests Immunization History  Administered Date(s) Administered   DT (Pediatric) 09/29/2014, 09/20/2015   Influenza Split 09/28/2013   Influenza, High Dose Seasonal PF 10/13/2014, 09/03/2016, 10/01/2017, 11/03/2018, 10/10/2020, 10/17/2021   Influenza-Unspecified 08/04/2015   PFIZER SARS-COV-2 Pediatric Vaccination 5-28yr 10/07/2020   PFIZER(Purple Top)SARS-COV-2 Vaccination 01/07/2020, 02/01/2020   Pneumococcal Conjugate-13 10/13/2014   Pneumococcal Polysaccharide-23 09/23/2012   Td 09/02/2004   Zoster, Live 10/13/2013    SURGICAL HISTORY She  has a past surgical  history that includes Abdominal hysterectomy; Cataract extraction (Bilateral); and Colonoscopy (x 2). FAMILY HISTORY Her family history includes Cancer in her mother and sister; Heart attack in her father; Heart disease in her father; Hypertension in her sister; Liver cancer in her mother. SOCIAL HISTORY She  reports that she quit smoking about 18 years ago. Her smoking use included cigarettes. She started smoking about 63 years ago. She has a 14.85 pack-year smoking history. She has never used smokeless tobacco. She reports that she does not drink alcohol and does not use drugs.  Review of Systems  Constitutional:  Negative for chills, fever, malaise/fatigue and weight loss.  HENT:  Negative for congestion, hearing loss, sinus pain, sore throat and tinnitus.   Eyes:  Negative for blurred vision and double vision.  Respiratory:  Negative for cough, sputum production, shortness of breath and wheezing.   Cardiovascular:  Negative for chest pain, palpitations, orthopnea, claudication, leg swelling and PND.  Gastrointestinal:  Negative for abdominal pain, blood in stool, constipation, diarrhea, heartburn, melena, nausea and vomiting.  Genitourinary: Negative.   Musculoskeletal:  Positive for joint pain (left knee and feet). Negative for falls and myalgias.  Skin:  Negative for rash.  Neurological:  Negative for dizziness, tingling, tremors, sensory change, loss of consciousness, weakness and headaches.  Endo/Heme/Allergies:  Negative for polydipsia.  Psychiatric/Behavioral: Negative.  Negative for depression, memory loss, substance abuse and suicidal ideas. The patient is not nervous/anxious and does not have insomnia.   All other systems reviewed and are negative.   Objective:     Today's Vitals   10/18/22 1056  BP: (!) 140/70  Pulse: (!) 51  Temp: (!) 97.2 F (36.2 C)  SpO2: 99%  Weight: 178 lb 12.8 oz (81.1 kg)  Height: '5\' 3"'$  (1.6 m)    Body mass index is 31.67 kg/m.  General  appearance: alert, no distress, WD/WN, female HEENT: normocephalic, sclerae anicteric, bilateral eyelids with mild erythema and dryness. TMs pearly, nares patent, no discharge or erythema, pharynx normal Oral cavity: MMM, no lesions Neck: supple, no lymphadenopathy, no thyromegaly, no masses Heart: RRR, normal S1, S2, no murmurs Lungs: CTA bilaterally, no wheezes, rhonchi, or rales Abdomen: +bs, soft, non tender, non distended, no masses, no hepatomegaly, no splenomegaly Musculoskeletal: RLE with LROM d/t pain. nontender, no swelling, no obvious deformity Extremities: no edema, no cyanosis, no clubbing Pulses: 2+ symmetric, upper and lower extremities, normal cap refill Neurological: alert, oriented x 3, CN2-12 intact, strength normal upper extremities and lower extremities,, DTRs 2+ throughout, no cerebellar signs, gait normal, sensation intact Psychiatric: normal affect, behavior normal, pleasant   Darrol Jump NP [facility]  10/18/2022

## 2022-10-18 NOTE — Patient Instructions (Signed)
Sciatica  Sciatica is pain, weakness, tingling, or loss of feeling (numbness) along the sciatic nerve. The sciatic nerve starts in the lower back and goes down the back of each leg. Sciatica usually affects one side of the body. Sciatica usually goes away on its own or with treatment. Sometimes, sciatica may come back. What are the causes? This condition happens when the sciatic nerve is pinched or has pressure put on it. This may be caused by: A disk in between the bones of the spine bulging out too far (herniated disk). Changes in the spinal disks due to aging. A condition that affects a muscle in the butt. Extra bone growth near the sciatic nerve. A break (fracture) of the area between your hip bones (pelvis). Pregnancy. Tumor. This is rare. What increases the risk? You are more likely to develop this condition if you: Play sports that put pressure or stress on the spine. Have poor strength and ease of movement (flexibility). Have had a back injury or back surgery. Sit for long periods of time. Do activities that involve bending or lifting over and over again. Are very overweight (obese). What are the signs or symptoms? Symptoms can vary from mild to very bad. They may include: Any of these problems in the lower back, leg, hip, or butt: Mild tingling, loss of feeling, or dull aches. A burning feeling. Sharp pains. Loss of feeling in the back of the calf or the sole of the foot. Leg weakness. Very bad back pain that makes it hard to move. These symptoms may get worse when you cough, sneeze, or laugh. They may also get worse when you sit or stand for long periods of time. How is this treated? This condition often gets better without any treatment. However, treatment may include: Changing or cutting back on physical activity when you have pain. Exercising, including strengthening and stretching. Putting ice or heat on the affected area. Shots of medicines to relieve pain and  swelling or to relax your muscles. Surgery. Follow these instructions at home: Medicines Take over-the-counter and prescription medicines only as told by your doctor. Ask your doctor if you should avoid driving or using machines while you are taking your medicine. Managing pain     If told, put ice on the affected area. To do this: Put ice in a plastic bag. Place a towel between your skin and the bag. Leave the ice on for 20 minutes, 2-3 times a day. If your skin turns bright red, take off the ice right away to prevent skin damage. The risk of skin damage is higher if you cannot feel pain, heat, or cold. If told, put heat on the affected area. Do this as often as told by your doctor. Use the heat source that your doctor tells you to use, such as a moist heat pack or a heating pad. Place a towel between your skin and the heat source. Leave the heat on for 20-30 minutes. If your skin turns bright red, take off the heat right away to prevent burns. The risk of burns is higher if you cannot feel pain, heat, or cold. Activity  Return to your normal activities when your doctor says that it is safe. Avoid activities that make your symptoms worse. Take short rests during the day. When you rest for a long time, do some physical activity or stretching between periods of rest. Avoid sitting for a long time without moving. Get up and move around at least one time each   hour. Do exercises and stretches as told by your doctor. Do not lift anything that is heavier than 10 lb (4.5 kg). Avoid lifting heavy things even when you do not have symptoms. Avoid lifting heavy things over and over. When you lift objects, always lift in a way that is safe for your body. To do this, you should: Bend your knees. Keep the object close to your body. Avoid twisting. General instructions Stay at a healthy weight. Wear comfortable shoes that support your feet. Avoid wearing high heels. Avoid sleeping on a mattress  that is too soft or too hard. You might have less pain if you sleep on a mattress that is firm enough to support your back. Contact a doctor if: Your pain is not controlled by medicine. Your pain does not get better. Your pain gets worse. Your pain lasts longer than 4 weeks. You lose weight without trying. Get help right away if: You cannot control when you pee (urinate) or poop (have a bowel movement). You have weakness in any of these areas and it gets worse: Lower back. The area between your hip bones. Butt. Legs. You have redness or swelling of your back. You have a burning feeling when you pee. Summary Sciatica is pain, weakness, tingling, or loss of feeling (numbness) along the sciatic nerve. This may include the lower back, legs, hips, and butt. This condition happens when the sciatic nerve is pinched or has pressure put on it. Treatment often includes rest, exercise, medicines, and putting ice or heat on the affected area. This information is not intended to replace advice given to you by your health care provider. Make sure you discuss any questions you have with your health care provider. Document Revised: 02/26/2022 Document Reviewed: 02/26/2022 Elsevier Patient Education  2023 [employer]  

## 2022-10-19 LAB — CBC WITH DIFFERENTIAL/PLATELET
Absolute Monocytes: 681 {cells}/uL (ref 200–950)
Basophils Absolute: 41 {cells}/uL (ref 0–200)
Basophils Relative: 0.5 %
Eosinophils Absolute: 213 {cells}/uL (ref 15–500)
Eosinophils Relative: 2.6 %
HCT: 32.8 % — ABNORMAL LOW (ref 35.0–45.0)
Hemoglobin: 11.1 g/dL — ABNORMAL LOW (ref 11.7–15.5)
Lymphs Abs: 2230 {cells}/uL (ref 850–3900)
MCH: 31.3 pg (ref 27.0–33.0)
MCHC: 33.8 g/dL (ref 32.0–36.0)
MCV: 92.4 fL (ref 80.0–100.0)
MPV: 11.2 fL (ref 7.5–12.5)
Monocytes Relative: 8.3 %
Neutro Abs: 5035 {cells}/uL (ref 1500–7800)
Neutrophils Relative %: 61.4 %
Platelets: 228 Thousand/uL (ref 140–400)
RBC: 3.55 Million/uL — ABNORMAL LOW (ref 3.80–5.10)
RDW: 13.3 % (ref 11.0–15.0)
Total Lymphocyte: 27.2 %
WBC: 8.2 Thousand/uL (ref 3.8–10.8)

## 2022-10-19 LAB — COMPLETE METABOLIC PANEL WITHOUT GFR
AG Ratio: 1.8 (calc) (ref 1.0–2.5)
ALT: 11 U/L (ref 6–29)
AST: 15 U/L (ref 10–35)
Albumin: 3.8 g/dL (ref 3.6–5.1)
Alkaline phosphatase (APISO): 95 U/L (ref 37–153)
BUN/Creatinine Ratio: 24 (calc) — ABNORMAL HIGH (ref 6–22)
BUN: 45 mg/dL — ABNORMAL HIGH (ref 7–25)
CO2: 26 mmol/L (ref 20–32)
Calcium: 8.7 mg/dL (ref 8.6–10.4)
Chloride: 103 mmol/L (ref 98–110)
Creat: 1.91 mg/dL — ABNORMAL HIGH (ref 0.60–0.95)
Globulin: 2.1 g/dL (ref 1.9–3.7)
Glucose, Bld: 95 mg/dL (ref 65–99)
Potassium: 5 mmol/L (ref 3.5–5.3)
Sodium: 139 mmol/L (ref 135–146)
Total Bilirubin: 0.7 mg/dL (ref 0.2–1.2)
Total Protein: 5.9 g/dL — ABNORMAL LOW (ref 6.1–8.1)
eGFR: 26 mL/min/1.73m2 — ABNORMAL LOW

## 2022-10-19 LAB — HEMOGLOBIN A1C
Hgb A1c MFr Bld: 5.8 % of total Hgb — ABNORMAL HIGH (ref <5.7)
Mean Plasma Glucose: 120 mg/dL
eAG (mmol/L): 6.6 mmol/L

## 2022-10-19 LAB — LIPID PANEL
Cholesterol: 155 mg/dL (ref <200)
HDL: 40 mg/dL — ABNORMAL LOW (ref >=50)
LDL Cholesterol (Calc): 90 mg/dL (calc)
Non-HDL Cholesterol (Calc): 115 mg/dL (calc) (ref <130)
Total CHOL/HDL Ratio: 3.9 (calc) (ref <5.0)
Triglycerides: 152 mg/dL — ABNORMAL HIGH (ref <150)

## 2022-10-23 ENCOUNTER — Other Ambulatory Visit: Payer: Self-pay | Admitting: Nurse Practitioner

## 2022-10-23 ENCOUNTER — Telehealth: Payer: Self-pay | Admitting: Nurse Practitioner

## 2022-10-23 DIAGNOSIS — M5431 Sciatica, right side: Secondary | ICD-10-CM

## 2022-10-23 MED ORDER — PREDNISONE 10 MG PO TABS: ORAL_TABLET | ORAL | 0 refills | Status: DC

## 2022-10-23 NOTE — Telephone Encounter (Signed)
Patient states that she has one prednisone pill left and that she is starting to feel better but not completely yet. She is requesting a refill on the prednisone to get her through the holiday and if it doesn't improve her sciatica, she would like to be referred to a specialist.

## 2022-10-31 ENCOUNTER — Ambulatory Visit: Payer: Medicare Other | Admitting: Nurse Practitioner

## 2022-11-29 NOTE — Progress Notes (Signed)
[facility] Note  11/30/2022     CHIEF COMPLAINT Patient presents for Retina Follow Up   HISTORY OF PRESENT ILLNESS: Kelly Cantu is a 81 y.o. female who presents to the clinic today for:   HPI     Retina Follow Up   Patient presents with  Dry AMD.  In both eyes.  This started 3 months ago.  Duration of 3 months.  Since onset it is stable.  I, the attending physician,  performed the HPI with the patient and updated documentation appropriately.        Comments   3 month retina follow up ARMD OU pt states no vision changes noticed she denies any flashes or floaters she has been having some watering and is using refresh as needed       Last edited by Bernarda Caffey, MD on 11/30/2022  3:47 PM.    Pt states no change in vision, she is still taking AREDS and checking her vision on an amsler grid  Referring physician: Unk Pinto, [address] [place],  [place] [address]  HISTORICAL INFORMATION:   Selected notes from the MEDICAL RECORD NUMBER Referred by Dr. Macarthur Critchley for concern of ARMD LEE:  Ocular Hx- PMH-    CURRENT MEDICATIONS: No current outpatient medications on file. (Ophthalmic Drugs)   No current facility-administered medications for this visit. (Ophthalmic Drugs)   Current Outpatient Medications (Other)  Medication Sig   allopurinol (ZYLOPRIM) 300 MG tablet TAKE 1 TABLET BY MOUTH EVERY DAY   atenolol (TENORMIN) 50 MG tablet TAKE 1 TABLET DAILY FOR BLOOD PRESSURE   atorvastatin (LIPITOR) 20 MG tablet Take 1 tablet (20 mg total) by mouth daily.   BABY ASPIRIN PO Take 81 mg by mouth daily.   Cholecalciferol (VITAMIN D) 50 MCG (2000 UT) CAPS Take 1 capsule by mouth daily.   diclofenac sodium (VOLTAREN) 1 % GEL Apply 4 g topically 4 (four) times daily as needed.   famotidine (PEPCID) 20 MG tablet TAKE 1 TABLET TWICE A DAY AS NEEDED FOR REFLUX   furosemide (LASIX) 40 MG tablet TAKE 1 TABLET DAILY FOR BLOOD  PRESSURE AND FLUID   gabapentin (NEURONTIN) 100 MG capsule TAKE 1 CAPSULE THREE TIMES A DAY AS NEEDED FOR PAINFUL PERIPHERAL NEUROPATHY   mometasone (ELOCON) 0.1 % cream Apply to affected area daily   Multiple Vitamins-Minerals (ICAPS AREDS 2 PO) Take 1 capsule by mouth daily.   Multiple Vitamins-Minerals (MULTIVITAMIN PO) Take by mouth daily.   Omega-3 Fatty Acids (FISH OIL PO) Take by mouth daily.   predniSONE (DELTASONE) 10 MG tablet 1 tab 3 x day for 2 days, then 1 tab 2 x day for 2 days, then 1 tab 1 x day for 3 days   valsartan (DIOVAN) 160 MG tablet TAKE 1 TABLET AT NIGHT FOR BLOOD PRESSURE   No current facility-administered medications for this visit. (Other)   REVIEW OF SYSTEMS: ROS   Positive for: Eyes Last edited by Bernarda Caffey, MD on 11/30/2022  3:47 PM.     ALLERGIES Allergies  Allergen Reactions   Ace Inhibitors     Pt unsure of reaction   Feldene [Piroxicam] Rash   PAST MEDICAL HISTORY Past Medical History:  Diagnosis Date   Cataract    Chronic kidney disease    DDD (degenerative disc disease), lumbar    DJD (degenerative joint disease)    GERD (gastroesophageal reflux disease)    Hyperlipidemia    Hypertension  IBS (irritable bowel syndrome)    Prediabetes    Past Surgical History:  Procedure Laterality Date   ABDOMINAL HYSTERECTOMY     CATARACT EXTRACTION Bilateral    COLONOSCOPY  x 2   w/Dr Carlean Purl   FAMILY HISTORY Family History  Problem Relation Age of Onset   Cancer Mother        liver   Liver cancer Mother    Heart disease Father    Heart attack Father    Hypertension Sister    Cancer Sister        kidney   Colon cancer Neg Hx    Breast cancer Neg Hx    SOCIAL HISTORY Social History   Tobacco Use   Smoking status: Former    Packs/day: 0.33    Years: 45.00    Total pack years: 14.85    Types: Cigarettes    Start date: 63    Quit date: 06/16/2004    Years since quitting: 18.4   Smokeless tobacco: Never  Substance Use  Topics   Alcohol use: No   Drug use: No       OPHTHALMIC EXAM:  Base Eye Exam     Visual Acuity (Snellen - Linear)       Right Left   Dist [place] 20/40 +2 20/50   Dist ph [place] 20/30 -2 NI         Tonometry (Tonopen, 1:54 PM)       Right Left   Pressure 20 17         Pupils       Pupils Dark Light Shape React APD   Right PERRL 3 2 Round Brisk None   Left PERRL 3 2 Round Brisk None         Visual Fields       Left Right    Full Full         Extraocular Movement       Right Left    Full, Ortho Full, Ortho         Neuro/Psych     Oriented x3: Yes   Mood/Affect: Normal         Dilation     Both eyes: 2.5% Phenylephrine @ 1:54 PM           Slit Lamp and Fundus Exam     Slit Lamp Exam       Right Left   Lids/Lashes Dermatochalasis - upper lid Dermatochalasis - upper lid   Conjunctiva/Sclera White and quiet White and quiet   Cornea 1+ Punctate epithelial erosions, trace tear film debris, well healed cataract wound 1+ Punctate epithelial erosions, trace tear film debris, well healed cataract wound   Anterior Chamber deep and clear deep and clear   Iris Round and dilated Round and dilated   Lens PC IOL in good position with open PC PC IOL in good position with open PC   Anterior Vitreous mild syneresis, Posterior vitreous detachment mild syneresis, Posterior vitreous detachment         Fundus Exam       Right Left   Disc mild Pallor, Sharp rim, mild PPA 1-2 Pallor, Sharp rim   C/D Ratio 0.4 0.5   Macula Flat, Blunted foveal reflex, Drusen, RPE mottling and clumping, No heme or edema Flat, Blunted foveal reflex, fine Drusen, RPE mottling and clumping, central vitelliform like lesion, No heme or edema   Vessels attenuated, Tortuous attenuated, Tortuous   Periphery Attached, reticular degeneration, No heme  Attached, reticular degeneration, No heme           IMAGING AND PROCEDURES  Imaging and Procedures for 11/30/2022  OCT, Retina -  OU - Both Eyes       Right Eye Quality was good. Central Foveal Thickness: 287. Progression has been stable. Findings include normal foveal contour, no IRF, no SRF, retinal drusen , subretinal hyper-reflective material (Central vitelliform like lesion).   Left Eye Quality was good. Central Foveal Thickness: 308. Progression has been stable. Findings include no IRF, no SRF, abnormal foveal contour, retinal drusen , subretinal hyper-reflective material, pigment epithelial detachment (Central vitelliform like lesion).   Notes *Images captured and stored on drive  Diagnosis / Impression:  Non-exu ARMD OU Central vitelliform lesion OU -- stable  Clinical management:  See below  Abbreviations: NFP - Normal foveal profile. CME - cystoid macular edema. PED - pigment epithelial detachment. IRF - intraretinal fluid. SRF - subretinal fluid. EZ - ellipsoid zone. ERM - epiretinal membrane. ORA - outer retinal atrophy. ORT - outer retinal tubulation. SRHM - subretinal hyper-reflective material. IRHM - intraretinal hyper-reflective material            ASSESSMENT/PLAN:    ICD-10-CM   1. Intermediate stage nonexudative age-related macular degeneration of both eyes  H35.3132 OCT, Retina - OU - Both Eyes    2. Essential hypertension  I10     3. Hypertensive retinopathy of both eyes  H35.033     4. Pseudophakia, both eyes  Z96.1      Age related macular degeneration, non-exudative, both eyes  - intermediate stage with central vitelliform like lesions OU -- stable  - The incidence, anatomy, and pathology of dry AMD, risk of progression, and the AREDS and AREDS 2 study including smoking risks discussed with patient.  - cont amsler grid monitoring  - f/u 6-9 months, DFE, OCT  2,3. Hypertensive retinopathy OU - discussed importance of tight BP control - monitor  4. Pseudophakia OU  - s/p CE/IOL OU (Dr. Talbert Forest)  - IOLs in good position, doing well  - monitor  Ophthalmic Meds Ordered this  visit:  No orders of the defined types were placed in this encounter.    Return for f/u 6-9 months, non-exu ARMD OU, DFE, OCT.  There are no Patient Instructions on file for this visit.  Explained the diagnoses, plan, and follow up with the patient and they expressed understanding.  Patient expressed understanding of the importance of proper follow up care.   This document serves as a record of services personally performed by Gardiner Sleeper, MD, PhD. It was created on their behalf by Orvan Falconer, an ophthalmic technician. The creation of this record is the provider's dictation and/or activities during the visit.    Electronically signed by: Orvan Falconer, OA, 11/30/22  3:48 PM  Gardiner Sleeper, M.D., Ph.D. Diseases & Surgery of the Retina and Vitreous [facility]  I have reviewed the above documentation for accuracy and completeness, and I agree with the above. Gardiner Sleeper, M.D., Ph.D. 11/30/22 3:49 PM   Abbreviations: M myopia (nearsighted); A astigmatism; H hyperopia (farsighted); P presbyopia; Mrx spectacle prescription;  CTL contact lenses; OD right eye; OS left eye; OU both eyes  XT exotropia; ET esotropia; PEK punctate epithelial keratitis; PEE punctate epithelial erosions; DES dry eye syndrome; MGD meibomian gland dysfunction; ATs artificial tears; PFAT's preservative free artificial tears; [place] nuclear sclerotic cataract; PSC posterior subcapsular cataract; ERM epi-retinal membrane; PVD posterior vitreous  detachment; RD retinal detachment; DM diabetes mellitus; DR diabetic retinopathy; NPDR non-proliferative diabetic retinopathy; PDR proliferative diabetic retinopathy; CSME clinically significant macular edema; DME diabetic macular edema; dbh dot blot hemorrhages; CWS cotton wool spot; POAG primary open angle glaucoma; C/D cup-to-disc ratio; HVF humphrey visual field; GVF goldmann visual field; OCT optical coherence tomography; IOP intraocular  pressure; BRVO Branch retinal vein occlusion; CRVO central retinal vein occlusion; CRAO central retinal artery occlusion; BRAO branch retinal artery occlusion; RT retinal tear; SB scleral buckle; PPV pars plana vitrectomy; VH Vitreous hemorrhage; PRP panretinal laser photocoagulation; IVK intravitreal kenalog; VMT vitreomacular traction; MH Macular hole;  NVD neovascularization of the disc; NVE neovascularization elsewhere; AREDS age related eye disease study; ARMD age related macular degeneration; POAG primary open angle glaucoma; EBMD epithelial/anterior basement membrane dystrophy; ACIOL anterior chamber intraocular lens; IOL intraocular lens; PCIOL posterior chamber intraocular lens; Phaco/IOL phacoemulsification with intraocular lens placement; [place] photorefractive keratectomy; LASIK laser assisted in situ keratomileusis; HTN hypertension; DM diabetes mellitus; COPD chronic obstructive pulmonary disease

## 2022-11-30 ENCOUNTER — Encounter (INDEPENDENT_AMBULATORY_CARE_PROVIDER_SITE_OTHER): Payer: Self-pay | Admitting: Ophthalmology

## 2022-11-30 ENCOUNTER — Ambulatory Visit (INDEPENDENT_AMBULATORY_CARE_PROVIDER_SITE_OTHER): Payer: Medicare Other | Admitting: Ophthalmology

## 2022-11-30 DIAGNOSIS — H35033 Hypertensive retinopathy, bilateral: Secondary | ICD-10-CM | POA: Diagnosis not present

## 2022-11-30 DIAGNOSIS — Z961 Presence of intraocular lens: Secondary | ICD-10-CM

## 2022-11-30 DIAGNOSIS — I10 Essential (primary) hypertension: Secondary | ICD-10-CM | POA: Diagnosis not present

## 2022-11-30 DIAGNOSIS — H353132 Nonexudative age-related macular degeneration, bilateral, intermediate dry stage: Secondary | ICD-10-CM | POA: Diagnosis not present

## 2022-12-11 ENCOUNTER — Other Ambulatory Visit: Payer: Self-pay | Admitting: Nurse Practitioner

## 2022-12-11 DIAGNOSIS — E1169 Type 2 diabetes mellitus with other specified complication: Secondary | ICD-10-CM

## 2022-12-19 ENCOUNTER — Other Ambulatory Visit: Payer: Self-pay

## 2022-12-19 DIAGNOSIS — K219 Gastro-esophageal reflux disease without esophagitis: Secondary | ICD-10-CM

## 2022-12-19 MED ORDER — FAMOTIDINE 20 MG PO TABS: ORAL_TABLET | ORAL | 3 refills | Status: DC

## 2023-01-10 DIAGNOSIS — H353132 Nonexudative age-related macular degeneration, bilateral, intermediate dry stage: Secondary | ICD-10-CM | POA: Diagnosis not present

## 2023-01-17 ENCOUNTER — Telehealth: Payer: Self-pay | Admitting: Nurse Practitioner

## 2023-01-17 DIAGNOSIS — I10 Essential (primary) hypertension: Secondary | ICD-10-CM

## 2023-01-17 DIAGNOSIS — E1140 Type 2 diabetes mellitus with diabetic neuropathy, unspecified: Secondary | ICD-10-CM

## 2023-01-17 MED ORDER — VALSARTAN 160 MG PO TABS: ORAL_TABLET | ORAL | 3 refills | Status: DC

## 2023-01-17 MED ORDER — GABAPENTIN 100 MG PO CAPS: ORAL_CAPSULE | ORAL | 3 refills | Status: DC

## 2023-01-17 NOTE — Telephone Encounter (Signed)
Pt is requesting refill on valsartan and [facility]

## 2023-01-17 NOTE — Addendum Note (Signed)
Addended by: Chancy Hurter on: 01/17/2023 04:15 PM   Modules accepted: Orders

## 2023-02-04 DIAGNOSIS — M109 Gout, unspecified: Secondary | ICD-10-CM | POA: Diagnosis not present

## 2023-02-04 DIAGNOSIS — N189 Chronic kidney disease, unspecified: Secondary | ICD-10-CM | POA: Diagnosis not present

## 2023-02-04 DIAGNOSIS — N1832 Chronic kidney disease, stage 3b: Secondary | ICD-10-CM | POA: Diagnosis not present

## 2023-02-04 DIAGNOSIS — D631 Anemia in chronic kidney disease: Secondary | ICD-10-CM | POA: Diagnosis not present

## 2023-02-04 DIAGNOSIS — I129 Hypertensive chronic kidney disease with stage 1 through stage 4 chronic kidney disease, or unspecified chronic kidney disease: Secondary | ICD-10-CM | POA: Diagnosis not present

## 2023-02-04 DIAGNOSIS — N2581 Secondary hyperparathyroidism of renal origin: Secondary | ICD-10-CM | POA: Diagnosis not present

## 2023-02-28 ENCOUNTER — Encounter: Payer: Self-pay | Admitting: Internal Medicine

## 2023-04-05 ENCOUNTER — Encounter: Payer: Self-pay | Admitting: Nurse Practitioner

## 2023-04-05 ENCOUNTER — Ambulatory Visit (INDEPENDENT_AMBULATORY_CARE_PROVIDER_SITE_OTHER): Payer: Medicare Other | Admitting: Nurse Practitioner

## 2023-04-05 VITALS — BP 158/82 | HR 52 | Temp 97.5°F | Ht 62.5 in | Wt 179.2 lb

## 2023-04-05 DIAGNOSIS — M159 Polyosteoarthritis, unspecified: Secondary | ICD-10-CM

## 2023-04-05 DIAGNOSIS — Z136 Encounter for screening for cardiovascular disorders: Secondary | ICD-10-CM | POA: Diagnosis not present

## 2023-04-05 DIAGNOSIS — Z0001 Encounter for general adult medical examination with abnormal findings: Secondary | ICD-10-CM

## 2023-04-05 DIAGNOSIS — Z79899 Other long term (current) drug therapy: Secondary | ICD-10-CM

## 2023-04-05 DIAGNOSIS — E66811 Obesity, class 1: Secondary | ICD-10-CM

## 2023-04-05 DIAGNOSIS — E1122 Type 2 diabetes mellitus with diabetic chronic kidney disease: Secondary | ICD-10-CM

## 2023-04-05 DIAGNOSIS — M150 Primary generalized (osteo)arthritis: Secondary | ICD-10-CM

## 2023-04-05 DIAGNOSIS — E785 Hyperlipidemia, unspecified: Secondary | ICD-10-CM | POA: Diagnosis not present

## 2023-04-05 DIAGNOSIS — Z0000 Encounter for general adult medical examination without abnormal findings: Secondary | ICD-10-CM

## 2023-04-05 DIAGNOSIS — E559 Vitamin D deficiency, unspecified: Secondary | ICD-10-CM | POA: Diagnosis not present

## 2023-04-05 DIAGNOSIS — N184 Chronic kidney disease, stage 4 (severe): Secondary | ICD-10-CM

## 2023-04-05 DIAGNOSIS — I10 Essential (primary) hypertension: Secondary | ICD-10-CM

## 2023-04-05 DIAGNOSIS — E1169 Type 2 diabetes mellitus with other specified complication: Secondary | ICD-10-CM | POA: Diagnosis not present

## 2023-04-05 DIAGNOSIS — N2581 Secondary hyperparathyroidism of renal origin: Secondary | ICD-10-CM

## 2023-04-05 DIAGNOSIS — K219 Gastro-esophageal reflux disease without esophagitis: Secondary | ICD-10-CM

## 2023-04-05 DIAGNOSIS — E669 Obesity, unspecified: Secondary | ICD-10-CM

## 2023-04-05 DIAGNOSIS — M1000 Idiopathic gout, unspecified site: Secondary | ICD-10-CM

## 2023-04-05 DIAGNOSIS — K589 Irritable bowel syndrome without diarrhea: Secondary | ICD-10-CM

## 2023-04-05 NOTE — Patient Instructions (Signed)

## 2023-04-05 NOTE — Progress Notes (Signed)
CPE  Assessment:   Diagnoses and all orders for this visit:  Annual Physical  Due annually Health maintenance reviewed  Essential hypertension Discussed DASH (Dietary Approaches to Stop Hypertension) DASH diet is lower in sodium than a typical American diet. Cut back on foods that are high in saturated fat, cholesterol, and trans fats. Eat more whole-grain foods, fish, poultry, and nuts Remain active and exercise as tolerated daily.  Monitor BP at home-Call if greater than 130/80.  Check CMP/CBC  Irritable bowel syndrome, unspecified type Symptoms stable with lifestyle modification  Gastroesophageal reflux disease, esophagitis presence not specified No suspected reflux complications (Barret/stricture). Lifestyle modification:  wt loss, avoid meals 2-3h before bedtime. Consider eliminating food triggers:  chocolate, caffeine, EtOH, acid/spicy food.   Primary osteoarthritis involving multiple joints Mild, L>R Uses tylenol, gabapentin, voltaren PRN Continue follow up with ortho as needed  CKD (chronic kidney disease) stage 4, GFR 15-29 ml/min (HCC) Discussed how what you eat and drink can aide in kidney protection. Stay well hydrated. Avoid high salt foods. Avoid NSAIDS. Keep BP and BG well controlled.   Take medications as prescribed. Remain active and exercise as tolerated daily. Maintain weight.  Continue to monitor. Check CMP/GFR/Microablumin Continue follow up with nephrology- Dr. Allena Katz  Vitamin D deficiency Continue low dose as recommended by nephrology  Continue to monitor  Medication management All medications discussed and reviewed in full. All questions and concerns regarding medications addressed.    Mixed hyperlipidemia Discussed lifestyle modifications. Recommended diet heavy in fruits and veggies, omega 3's. Decrease consumption of animal meats, cheeses, and dairy products. Remain active and exercise as tolerated. Continue to monitor. Check  lipids/TSH  Idiopathic gout, unspecified chronicity, unspecified site Continue allopurinol Monitor GFR Discussed low purine diet.  Obesity (BMI 30.0-34.9) Discussed appropriate BMI Diet modification. Physical activity. Encouraged/praised to build confidence.  Type 2 diabetes mellitus with stage 4 chronic kidney disease, without long-term current use of insulin Education: Reviewed 'ABCs' of diabetes management  Discussed goals to be met and/or maintained include A1C (<7) Blood pressure (<130/80) Cholesterol (LDL <70) Continue Eye Exam yearly  Continue Dental Exam Q6 mo Discussed dietary recommendations Discussed Physical Activity recommendations Foot exam UTD Check A1C  Secondary hyperparathyroidism of renal origin (HCC) Off of calcium supplement; low dose vitamin D - 2000 units only; improved; monitoring only; nephrology(Dr. Allena Katz) is following   Orders Placed This Encounter  Procedures   CBC with Differential/Platelet   COMPLETE METABOLIC PANEL WITH GFR   Magnesium   Lipid panel   TSH   Hemoglobin A1c   Insulin, random   VITAMIN D 25 Hydroxy (Vit-D Deficiency, Fractures)   Urinalysis, Routine w reflex microscopic   Microalbumin / creatinine urine ratio   EKG 12-Lead    Notify office for further evaluation and treatment, questions or concerns if any reported s/s fail to improve.   The patient was advised to call back or seek an in-person evaluation if any symptoms worsen or if the condition fails to improve as anticipated.   Further disposition pending results of labs. Discussed med's effects and SE's.    I discussed the assessment and treatment plan with the patient. The patient was provided an opportunity to ask questions and all were answered. The patient agreed with the plan and demonstrated an understanding of the instructions.  Discussed med's effects and SE's. Screening labs and tests as requested with regular follow-up as recommended.  I provided 35  minutes of face-to-face time during this encounter including counseling, chart review, and  critical decision making was preformed.  Today's Plan of Care is based on a patient-centered health care approach known as shared decision making - the decisions, tests and treatments allow for patient preferences and values to be balanced with clinical evidence.    Future Appointments  Date Time Provider Department Center  07/19/2023 11:00 AM Adela Glimpse, NP [facility] None  08/30/2023  2:00 PM Rennis Chris, MD [facility] None  04/13/2024  3:00 PM Lucky Cowboy, MD [facility] None     Plan:   During the course of the visit the patient was educated and counseled about appropriate screening and preventive services including:   Pneumococcal vaccine  Prevnar 13 Influenza vaccine Td vaccine Screening electrocardiogram Bone densitometry screening Colorectal cancer screening Diabetes screening Glaucoma screening Nutrition counseling  Advanced directives: requested   Subjective:  Kelly Cantu is a 82 y.o. female who presents for Medicare Annual Wellness Visit and 3 month follow up.   Overall she reports feeling well today.    Patient has hx/o GERD controlled by lifestyle and meds (famotidine).   She is followed by Dr. Allena Katz for CKD 4 secondary to T2DM.   She has some aching intermittently in knees feet and hands, takes tylenol, gabapentin 100 mg TID, voltaren topical with benefit. Some left knee pain which is intermittent controlled with Tylenol and Voltaren gel  BMI is Body mass index is 32.25 kg/m., she has been working on diet, has not been exercising. Wt Readings from Last 3 Encounters:  04/05/23 179 lb 3.2 oz (81.3 kg)  10/18/22 178 lb 12.8 oz (81.1 kg)  07/18/22 179 lb 6.4 oz (81.4 kg)    Her blood pressure has been controlled at home, today their BP is BP: (!) 158/82.  She does not workout. She denies chest pain, shortness of breath, dizziness.   She is on cholesterol  medication (atorvastatin 20 mg daily)  and denies myalgias. Her LDL cholesterol is at goal; triglycerides remain elevated. The cholesterol last visit was:   Lab Results  Component Value Date   CHOL 155 10/18/2022   HDL 40 (L) 10/18/2022   LDLCALC 90 10/18/2022   TRIG 152 (H) 10/18/2022   CHOLHDL 3.9 10/18/2022    She has been working on diet for lifestyle controlled T2DM (A1C 6.6% and 6.5% in 2017), and denies foot ulcerations, increased appetite, nausea, paresthesia of the feet, polydipsia, polyuria, visual disturbances, vomiting and weight loss. Last A1C in the office was:  Lab Results  Component Value Date   HGBA1C 5.8 (H) 10/18/2022   She has CKD 4 followed by nephrology Dr. Allena Katz, also follows secondary hyperparathyroid of renal origin. Drinks 4+ bottles of water daily. Last GFR: Lab Results  Component Value Date   GFRNONAA 24 (L) 03/30/2021   Patient is on vitamin D3 2000 IU per nephrology instructions:  Lab Results  Component Value Date   VD25OH 57 04/03/2022     Patient is on allopurinol for gout and does not report a recent flare.  Lab Results  Component Value Date   LABURIC 3.4 04/03/2022     Medication Review: Current Outpatient Medications on File Prior to Visit  Medication Sig Dispense Refill   allopurinol (ZYLOPRIM) 300 MG tablet TAKE 1 TABLET BY MOUTH EVERY DAY 90 tablet 99   atenolol (TENORMIN) 50 MG tablet TAKE 1 TABLET DAILY FOR BLOOD PRESSURE 90 tablet 3   atorvastatin (LIPITOR) 20 MG tablet TAKE 1 TABLET DAILY 30 tablet 11   BABY ASPIRIN PO Take 81 mg by mouth  daily.     Cholecalciferol (VITAMIN D) 50 MCG (2000 UT) CAPS Take 1 capsule by mouth daily.     diclofenac sodium (VOLTAREN) 1 % GEL Apply 4 g topically 4 (four) times daily as needed. 500 g 6   famotidine (PEPCID) 20 MG tablet TAKE 1 TABLET TWICE A DAY AS NEEDED FOR REFLUX 180 tablet 3   furosemide (LASIX) 40 MG tablet TAKE 1 TABLET DAILY FOR BLOOD PRESSURE AND FLUID 90 tablet 3   gabapentin  (NEURONTIN) 100 MG capsule TAKE 1 CAPSULE THREE TIMES A DAY AS NEEDED FOR PAINFUL PERIPHERAL NEUROPATHY 270 capsule 3   mometasone (ELOCON) 0.1 % cream Apply to affected area daily 45 g 1   Multiple Vitamins-Minerals (ICAPS AREDS 2 PO) Take 1 capsule by mouth daily.     Multiple Vitamins-Minerals (MULTIVITAMIN PO) Take by mouth daily.     Omega-3 Fatty Acids (FISH OIL PO) Take by mouth daily.     predniSONE (DELTASONE) 10 MG tablet 1 tab 3 x day for 2 days, then 1 tab 2 x day for 2 days, then 1 tab 1 x day for 3 days 13 tablet 0   valsartan (DIOVAN) 160 MG tablet TAKE 1 TABLET AT NIGHT FOR BLOOD PRESSURE 90 tablet 3   No current facility-administered medications on file prior to visit.    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HCC) 04/03/2022   FHx: heart disease 07/31/2018   Former smoker 07/31/2018   Type 2 diabetes mellitus with stage 4 chronic kidney disease, without long-term current use of insulin (HCC) 07/31/2018   Secondary hyperparathyroidism of renal origin (HCC) 04/10/2018   Screening for heart disease 11/13/2015   Obesity (BMI 30.0-34.9) 11/11/2015   Idiopathic gout 04/11/2015   Vitamin D deficiency 09/29/2014   Medication management 09/29/2014   CKD (chronic kidney disease) stage 4, GFR 15-29 ml/min (HCC) 07/22/2014   Hyperlipidemia associated with type 2 diabetes mellitus (HCC)    Essential hypertension    GERD (gastroesophageal reflux disease)    DDD (degenerative disc disease), lumbar    IBS (irritable bowel syndrome)     Screening Tests Immunization History  Administered Date(s) Administered   DT (Pediatric) 09/29/2014, 09/20/2015   Influenza Split 09/28/2013   Influenza, High Dose Seasonal PF 10/13/2014, 09/03/2016, 10/01/2017, 11/03/2018, 10/10/2020, 10/17/2021, 10/18/2022    Influenza-Unspecified 08/04/2015   PFIZER SARS-COV-2 Pediatric Vaccination 5-16yrs 10/07/2020   PFIZER(Purple Top)SARS-COV-2 Vaccination 01/07/2020, 02/01/2020   Pneumococcal Conjugate-13 10/13/2014   Pneumococcal Polysaccharide-23 09/23/2012   Td 09/02/2004   Zoster, Live 10/13/2013   Preventative care: Last colonoscopy: 2015, DONE due to age unless problems Last mammogram: 05/2022 negative repeat 1 year Last pap smear/pelvic exam: remote, s/p abd hysterectomy   DEXA:2011, 07/2018 T 0.4 normal - Declines further work up.  Education provided on benefit of further screening.  Will continue to monitor.  CXR 2015 US renal 2012  Prior vaccinations: TD or Tdap: 2016 Influenza: Due 09/2022  Pneumococcal: 2013 Prevnar13: 2015 Shingles/Zostavax: 2014 Covid 19: 2/2, 2021, pfizer  Names of Other Physician/Practitioners you currently use: 1. [facility] here for primary care 2. Dr. Lorin Picket, eye doctor, last visit 2024 allows follows with Dr. Chyrl Civatte opthamoloyg for macular degenreation seen las 2024 3. Jon Billings, dentist, last visit 2024 q6 months   Patient Care  Team: Lucky Cowboy, MD as PCP - General (Internal Medicine) Iva Boop, MD as Consulting Physician (Gastroenterology) Fredrich Birks, OD as Referring Physician (Optometry) Zetta Bills, MD as Consulting Physician (Nephrology) seen 12/06/2020 stable f/u 6 months  SURGICAL HISTORY She  has a past surgical history that includes Abdominal hysterectomy; Cataract extraction (Bilateral); and Colonoscopy (x 2). FAMILY HISTORY Her family history includes Cancer in her mother and sister; Heart attack in her father; Heart disease in her father; Hypertension in her sister; Liver cancer in her mother. SOCIAL HISTORY She  reports that she quit smoking about 18 years ago. Her smoking use included cigarettes. She started smoking about 64 years ago. She has a 14.85 pack-year smoking history. She has never used  smokeless tobacco. She reports that she does not drink alcohol and does not use drugs.  Review of Systems  Constitutional:  Negative for chills, fever, malaise/fatigue and weight loss.  HENT:  Negative for congestion, hearing loss, sinus pain, sore throat and tinnitus.   Eyes:  Negative for blurred vision and double vision.  Respiratory:  Negative for cough, sputum production, shortness of breath and wheezing.   Cardiovascular:  Negative for chest pain, palpitations, orthopnea, claudication, leg swelling and PND.  Gastrointestinal:  Negative for abdominal pain, blood in stool, constipation, diarrhea, heartburn, melena, nausea and vomiting.  Genitourinary: Negative.   Musculoskeletal:  Positive for joint pain (left knee and feet). Negative for falls and myalgias.  Skin:  Negative for rash.  Neurological:  Negative for dizziness, tingling, tremors, sensory change, loss of consciousness, weakness and headaches.  Endo/Heme/Allergies:  Negative for polydipsia.  Psychiatric/Behavioral: Negative.  Negative for depression, memory loss, substance abuse and suicidal ideas. The patient is not nervous/anxious and does not have insomnia.   All other systems reviewed and are negative.   Objective:     Today's Vitals   04/05/23 1005  BP: (!) 158/82  Pulse: (!) 52  Temp: (!) 97.5 F (36.4 C)  SpO2: 99%  Weight: 179 lb 3.2 oz (81.3 kg)  Height: 5' 2.5" (1.588 m)    Body mass index is 32.25 kg/m.  General appearance: alert, no distress, WD/WN, female HEENT: normocephalic, sclerae anicteric, TMs pearly, nares patent, no discharge or erythema, pharynx normal Oral cavity: MMM, no lesions Neck: supple, no lymphadenopathy, no thyromegaly, no masses Heart: RRR, normal S1, S2, no murmurs Lungs: CTA bilaterally, no wheezes, rhonchi, or rales Abdomen: +bs, soft, non tender, non distended, no masses, no hepatomegaly, no splenomegaly Musculoskeletal: nontender, no swelling, no obvious  deformity Extremities: no edema, no cyanosis, no clubbing Pulses: 2+ symmetric, upper and lower extremities, normal cap refill Neurological: alert, oriented x 3, CN2-12 intact, strength normal upper extremities and lower extremities,, DTRs 2+ throughout, no cerebellar signs, gait normal, sensation intact Psychiatric: normal affect, behavior normal, pleasant   EKG:  SB  Adela Glimpse NP [facility]  04/05/2023

## 2023-04-08 ENCOUNTER — Encounter: Payer: Medicare Other | Admitting: Internal Medicine

## 2023-04-08 LAB — CBC WITH DIFFERENTIAL/PLATELET
Absolute Monocytes: 708 cells/uL (ref 200–950)
Basophils Absolute: 46 cells/uL (ref 0–200)
Basophils Relative: 0.5 %
Eosinophils Absolute: 239 cells/uL (ref 15–500)
Eosinophils Relative: 2.6 %
HCT: 34.6 % — ABNORMAL LOW (ref 35.0–45.0)
Hemoglobin: 11.3 g/dL — ABNORMAL LOW (ref 11.7–15.5)
Lymphs Abs: 2328 cells/uL (ref 850–3900)
MCH: 30.3 pg (ref 27.0–33.0)
MCHC: 32.7 g/dL (ref 32.0–36.0)
MCV: 92.8 fL (ref 80.0–100.0)
MPV: 11.1 fL (ref 7.5–12.5)
Monocytes Relative: 7.7 %
Neutro Abs: 5879 cells/uL (ref 1500–7800)
Neutrophils Relative %: 63.9 %
Platelets: 229 10*3/uL (ref 140–400)
RBC: 3.73 10*6/uL — ABNORMAL LOW (ref 3.80–5.10)
RDW: 14 % (ref 11.0–15.0)
Total Lymphocyte: 25.3 %
WBC: 9.2 10*3/uL (ref 3.8–10.8)

## 2023-04-08 LAB — LIPID PANEL
Cholesterol: 151 mg/dL (ref <200)
HDL: 44 mg/dL — ABNORMAL LOW (ref >=50)
LDL Cholesterol (Calc): 82 mg/dL (calc)
Non-HDL Cholesterol (Calc): 107 mg/dL (calc) (ref <130)
Total CHOL/HDL Ratio: 3.4 (calc) (ref <5.0)
Triglycerides: 158 mg/dL — ABNORMAL HIGH (ref <150)

## 2023-04-08 LAB — URINALYSIS, ROUTINE W REFLEX MICROSCOPIC
Bilirubin Urine: NEGATIVE
Glucose, UA: NEGATIVE
Hgb urine dipstick: NEGATIVE
Ketones, ur: NEGATIVE
Leukocytes,Ua: NEGATIVE
Nitrite: NEGATIVE
Protein, ur: NEGATIVE
Specific Gravity, Urine: 1.008 (ref 1.001–1.035)
pH: <=5 (ref 5.0–8.0)

## 2023-04-08 LAB — COMPLETE METABOLIC PANEL WITH GFR
AG Ratio: 1.8 (calc) (ref 1.0–2.5)
ALT: 11 U/L (ref 6–29)
AST: 16 U/L (ref 10–35)
Albumin: 4.1 g/dL (ref 3.6–5.1)
Alkaline phosphatase (APISO): 92 U/L (ref 37–153)
BUN/Creatinine Ratio: 26 (calc) — ABNORMAL HIGH (ref 6–22)
BUN: 54 mg/dL — ABNORMAL HIGH (ref 7–25)
CO2: 23 mmol/L (ref 20–32)
Calcium: 9.6 mg/dL (ref 8.6–10.4)
Chloride: 103 mmol/L (ref 98–110)
Creat: 2.11 mg/dL — ABNORMAL HIGH (ref 0.60–0.95)
Globulin: 2.3 g/dL (calc) (ref 1.9–3.7)
Glucose, Bld: 109 mg/dL — ABNORMAL HIGH (ref 65–99)
Potassium: 5.1 mmol/L (ref 3.5–5.3)
Sodium: 135 mmol/L (ref 135–146)
Total Bilirubin: 0.7 mg/dL (ref 0.2–1.2)
Total Protein: 6.4 g/dL (ref 6.1–8.1)
eGFR: 23 mL/min/{1.73_m2} — ABNORMAL LOW (ref >=60)

## 2023-04-08 LAB — HEMOGLOBIN A1C
Hgb A1c MFr Bld: 5.8 % of total Hgb — ABNORMAL HIGH (ref <5.7)
Mean Plasma Glucose: 120 mg/dL
eAG (mmol/L): 6.6 mmol/L

## 2023-04-08 LAB — MAGNESIUM: Magnesium: 2.3 mg/dL (ref 1.5–2.5)

## 2023-04-08 LAB — MICROALBUMIN / CREATININE URINE RATIO
Creatinine, Urine: 49 mg/dL (ref 20–275)
Microalb Creat Ratio: 55 mg/g creat — ABNORMAL HIGH (ref <30)
Microalb, Ur: 2.7 mg/dL

## 2023-04-08 LAB — TSH: TSH: 2.25 mIU/L (ref 0.40–4.50)

## 2023-04-08 LAB — INSULIN, RANDOM: Insulin: 14.5 u[IU]/mL

## 2023-04-08 LAB — VITAMIN D 25 HYDROXY (VIT D DEFICIENCY, FRACTURES): Vit D, 25-Hydroxy: 52 ng/mL (ref 30–100)

## 2023-04-10 ENCOUNTER — Other Ambulatory Visit: Payer: Self-pay | Admitting: Internal Medicine

## 2023-04-10 DIAGNOSIS — Z1231 Encounter for screening mammogram for malignant neoplasm of breast: Secondary | ICD-10-CM

## 2023-04-17 ENCOUNTER — Telehealth: Payer: Self-pay

## 2023-04-17 NOTE — Telephone Encounter (Signed)
First attempt with patient regarding Safe Start program 3 month follow up. Will attempt to contact patient again to outreach regarding [facility] resources offered.  Baruch Gouty Renal Coordinator [facility] [phone]

## 2023-04-30 ENCOUNTER — Telehealth: Payer: Self-pay

## 2023-04-30 ENCOUNTER — Other Ambulatory Visit: Payer: Self-pay

## 2023-04-30 NOTE — Telephone Encounter (Signed)
Renal outreach call for Safe Start patient 3 month follow up.  Patient shared per recent labs, she is doing well. In addition, coordinator touched base regarding resources available for patient. Patient declined [facility] services as she believes she is going in the right direction regarding eating habits regarding both renal and diabetic needs.  Coordinator shared contact information again and expressed if patient was in need for any assistance to please call if needed. Call documented for future reference.  Baruch Gouty Renal Coordinator [facility] [phone]

## 2023-05-13 ENCOUNTER — Ambulatory Visit
Admission: RE | Admit: 2023-05-13 | Discharge: 2023-05-13 | Disposition: A | Discharge status: Home / Self Care | Payer: Medicare Other | Source: Ambulatory Visit | Attending: Internal Medicine | Admitting: Internal Medicine

## 2023-05-13 DIAGNOSIS — Z1231 Encounter for screening mammogram for malignant neoplasm of breast: Secondary | ICD-10-CM

## 2023-05-16 ENCOUNTER — Other Ambulatory Visit: Payer: Self-pay | Admitting: Internal Medicine

## 2023-05-16 DIAGNOSIS — R928 Other abnormal and inconclusive findings on diagnostic imaging of breast: Secondary | ICD-10-CM

## 2023-05-17 ENCOUNTER — Ambulatory Visit
Admission: RE | Admit: 2023-05-17 | Discharge: 2023-05-17 | Disposition: A | Discharge status: Home / Self Care | Payer: Medicare Other | Source: Ambulatory Visit | Attending: Internal Medicine | Admitting: Internal Medicine

## 2023-05-17 DIAGNOSIS — N6011 Diffuse cystic mastopathy of right breast: Secondary | ICD-10-CM | POA: Diagnosis not present

## 2023-05-17 DIAGNOSIS — R928 Other abnormal and inconclusive findings on diagnostic imaging of breast: Secondary | ICD-10-CM

## 2023-05-27 ENCOUNTER — Encounter: Payer: Medicare Other | Admitting: Internal Medicine

## 2023-06-06 ENCOUNTER — Other Ambulatory Visit: Payer: Self-pay | Admitting: Nurse Practitioner

## 2023-06-12 ENCOUNTER — Other Ambulatory Visit: Payer: Self-pay | Admitting: Nurse Practitioner

## 2023-07-08 ENCOUNTER — Telehealth: Payer: Self-pay | Admitting: Nurse Practitioner

## 2023-07-08 DIAGNOSIS — E1140 Type 2 diabetes mellitus with diabetic neuropathy, unspecified: Secondary | ICD-10-CM

## 2023-07-08 MED ORDER — GABAPENTIN 100 MG PO CAPS: ORAL_CAPSULE | ORAL | 3 refills | Status: DC

## 2023-07-08 NOTE — Telephone Encounter (Signed)
Requesting refill on Gabapentin. [facility] to [facility] on file.

## 2023-07-08 NOTE — Addendum Note (Signed)
Addended by: Dionicio Stall on: 07/08/2023 02:55 PM   Modules accepted: Orders

## 2023-07-17 DIAGNOSIS — H353132 Nonexudative age-related macular degeneration, bilateral, intermediate dry stage: Secondary | ICD-10-CM | POA: Diagnosis not present

## 2023-07-19 ENCOUNTER — Ambulatory Visit: Payer: Medicare Other | Admitting: Nurse Practitioner

## 2023-07-24 ENCOUNTER — Ambulatory Visit: Payer: Medicare Other | Admitting: Nurse Practitioner

## 2023-07-24 ENCOUNTER — Other Ambulatory Visit: Payer: Self-pay | Admitting: Nurse Practitioner

## 2023-07-24 ENCOUNTER — Encounter: Payer: Self-pay | Admitting: Nurse Practitioner

## 2023-07-24 VITALS — BP 144/62 | HR 54 | Temp 97.3°F | Ht 62.5 in | Wt 178.2 lb

## 2023-07-24 DIAGNOSIS — N184 Chronic kidney disease, stage 4 (severe): Secondary | ICD-10-CM

## 2023-07-24 DIAGNOSIS — I10 Essential (primary) hypertension: Secondary | ICD-10-CM | POA: Diagnosis not present

## 2023-07-24 DIAGNOSIS — E1169 Type 2 diabetes mellitus with other specified complication: Secondary | ICD-10-CM | POA: Diagnosis not present

## 2023-07-24 DIAGNOSIS — K219 Gastro-esophageal reflux disease without esophagitis: Secondary | ICD-10-CM

## 2023-07-24 DIAGNOSIS — E785 Hyperlipidemia, unspecified: Secondary | ICD-10-CM | POA: Diagnosis not present

## 2023-07-24 DIAGNOSIS — N2581 Secondary hyperparathyroidism of renal origin: Secondary | ICD-10-CM

## 2023-07-24 DIAGNOSIS — E669 Obesity, unspecified: Secondary | ICD-10-CM

## 2023-07-24 DIAGNOSIS — Z0000 Encounter for general adult medical examination without abnormal findings: Secondary | ICD-10-CM

## 2023-07-24 DIAGNOSIS — K589 Irritable bowel syndrome without diarrhea: Secondary | ICD-10-CM | POA: Diagnosis not present

## 2023-07-24 DIAGNOSIS — E559 Vitamin D deficiency, unspecified: Secondary | ICD-10-CM | POA: Diagnosis not present

## 2023-07-24 DIAGNOSIS — E1122 Type 2 diabetes mellitus with diabetic chronic kidney disease: Secondary | ICD-10-CM

## 2023-07-24 DIAGNOSIS — Z79899 Other long term (current) drug therapy: Secondary | ICD-10-CM | POA: Diagnosis not present

## 2023-07-24 DIAGNOSIS — M159 Polyosteoarthritis, unspecified: Secondary | ICD-10-CM

## 2023-07-24 DIAGNOSIS — M1000 Idiopathic gout, unspecified site: Secondary | ICD-10-CM | POA: Diagnosis not present

## 2023-07-24 NOTE — Progress Notes (Signed)
MEDICARE ANNUAL WELLNESS VISIT AND FOLLOW UP  Assessment:   Diagnoses and all orders for this visit:  Encounter for Medicare annual wellness exam Due annually  Health maintenance reviewed Healthily lifestyle goals set   Essential hypertension Discussed DASH (Dietary Approaches to Stop Hypertension) DASH diet is lower in sodium than a typical American diet. Cut back on foods that are high in saturated fat, cholesterol, and trans fats. Eat more whole-grain foods, fish, poultry, and nuts Remain active and exercise as tolerated daily.  Monitor BP at home-Call if greater than 130/80.  Check CMP/CBC  Irritable bowel syndrome, unspecified type Symptoms stable with lifestyle modification  Gastroesophageal reflux disease, esophagitis presence not specified No suspected reflux complications (Barret/stricture). Lifestyle modification:  wt loss, avoid meals 2-3h before bedtime. Consider eliminating food triggers:  chocolate, caffeine, EtOH, acid/spicy food.  Primary osteoarthritis involving multiple joints Mild, L>R Uses tylenol, gabapentin, voltaren PRN Continue follow up with ortho as needed  CKD (chronic kidney disease) stage 4, GFR 15-29 ml/min (HCC) Discussed how what you eat and drink can aide in kidney protection. Stay well hydrated. Avoid high salt foods. Avoid NSAIDS. Keep BP and BG well controlled.   Take medications as prescribed. Remain active and exercise as tolerated daily. Maintain weight.  Continue to monitor. Check CMP/GFR/Microablumin Continue follow up with nephrology- Dr. Allena Katz  Vitamin D deficiency Continue low dose as recommended by nephrology  Continue to monitor  Medication management All medications discussed and reviewed in full. All questions and concerns regarding medications addressed.    Mixed hyperlipidemia Discussed lifestyle modifications. Recommended diet heavy in fruits and veggies, omega 3's. Decrease consumption of animal meats,  cheeses, and dairy products. Remain active and exercise as tolerated. Continue to monitor. Check lipids/TSH  Idiopathic gout, unspecified chronicity, unspecified site Continue allopurinol Monitor GFR Discussed low purine diet.  Obesity (BMI 30.0-34.9) Discussed appropriate BMI Diet modification. Physical activity. Encouraged/praised to build confidence.  Type 2 diabetes mellitus with stage 4 chronic kidney disease, without long-term current use of insulin Education: Reviewed 'ABCs' of diabetes management  Discussed goals to be met and/or maintained include A1C (<7) Blood pressure (<130/80) Cholesterol (LDL <70) Continue Eye Exam yearly  Continue Dental Exam Q6 mo Discussed dietary recommendations Discussed Physical Activity recommendations Foot exam UTD Check A1C  Secondary hyperparathyroidism of renal origin (HCC) Off of calcium supplement; low dose vitamin D - 2000 units only; improved; monitoring only; nephrology(Dr. Allena Katz) is following  Orders Placed This Encounter  Procedures   CBC with Differential/Platelet   COMPLETE METABOLIC PANEL WITH GFR   Lipid panel   Hemoglobin A1c    Notify office for further evaluation and treatment, questions or concerns if any reported s/s fail to improve.   The patient was advised to call back or seek an in-person evaluation if any symptoms worsen or if the condition fails to improve as anticipated.   Further disposition pending results of labs. Discussed med's effects and SE's.    I discussed the assessment and treatment plan with the patient. The patient was provided an opportunity to ask questions and all were answered. The patient agreed with the plan and demonstrated an understanding of the instructions.  Discussed med's effects and SE's. Screening labs and tests as requested with regular follow-up as recommended.  I provided 40 minutes of face-to-face time during this encounter including counseling, chart review, and critical  decision making was preformed.  Today's Plan of Care is based on a patient-centered health care approach known as shared decision making -  the decisions, tests and treatments allow for patient preferences and values to be balanced with clinical evidence.    Future Appointments  Date Time Provider Department Center  08/30/2023  2:00 PM Rennis Chris, MD [facility] None  04/13/2024  3:00 PM Lucky Cowboy, MD [facility] None  07/23/2024  2:00 PM Adela Glimpse, NP [facility] None     Plan:   During the course of the visit the patient was educated and counseled about appropriate screening and preventive services including:   Pneumococcal vaccine  Prevnar 13 Influenza vaccine Td vaccine Screening electrocardiogram Bone densitometry screening Colorectal cancer screening Diabetes screening Glaucoma screening Nutrition counseling  Advanced directives: requested   Subjective:  Kelly Cantu is a 82 y.o. female who presents for Medicare Annual Wellness Visit and 3 month follow up.   Overall she reports feeling well today.  She had no additional concerns.    Patient has hx/o GERD controlled by lifestyle and meds (famotidine).   She is followed by Dr. Allena Katz for CKD 4 secondary to T2DM.   She has some aching intermittently in knees feet and hands, takes tylenol, gabapentin 100 mg TID, voltaren topical with benefit. Some left knee pain which is intermittent controlled with Tylenol and Voltaren gel  BMI is Body mass index is 32.07 kg/m., she has been working on diet, has not been exercising  Wt Readings from Last 3 Encounters:  07/24/23 178 lb 3.2 oz (80.8 kg)  04/05/23 179 lb 3.2 oz (81.3 kg)  10/18/22 178 lb 12.8 oz (81.1 kg)    Her blood pressure has been controlled at home, today their BP is BP: (!) 144/62.  She does not workout. She denies chest pain, shortness of breath, dizziness.   She is on cholesterol medication (atorvastatin 20 mg daily)  and denies myalgias. Her LDL  cholesterol is at goal; triglycerides remain elevated. The cholesterol last visit was:   Lab Results  Component Value Date   CHOL 151 04/05/2023   HDL 44 (L) 04/05/2023   LDLCALC 82 04/05/2023   TRIG 158 (H) 04/05/2023   CHOLHDL 3.4 04/05/2023    She has been working on diet for lifestyle controlled T2DM (A1C 6.6% and 6.5% in 2017), and denies foot ulcerations, increased appetite, nausea, paresthesia of the feet, polydipsia, polyuria, visual disturbances, vomiting and weight loss. Last A1C in the office was:  Lab Results  Component Value Date   HGBA1C 5.8 (H) 04/05/2023   She has CKD 4 followed by nephrology Dr. Allena Katz, also follows secondary hyperparathyroid of renal origin. Drinks 4+ bottles of water daily. Last GFR: Lab Results  Component Value Date   GFRNONAA 24 (L) 03/30/2021   Patient is on vitamin D3 2000 IU per nephrology instructions:  Lab Results  Component Value Date   VD25OH 52 04/05/2023     Patient is on allopurinol for gout and does not report a recent flare.  Lab Results  Component Value Date   LABURIC 3.4 04/03/2022     Medication Review: Current Outpatient Medications on File Prior to Visit  Medication Sig Dispense Refill   allopurinol (ZYLOPRIM) 300 MG tablet TAKE 1 TABLET BY MOUTH EVERY DAY 90 tablet 99   atenolol (TENORMIN) 50 MG tablet TAKE 1 TABLET DAILY FOR BLOOD PRESSURE 90 tablet 3   atorvastatin (LIPITOR) 20 MG tablet TAKE 1 TABLET DAILY 30 tablet 11   BABY ASPIRIN PO Take 81 mg by mouth daily.     Cholecalciferol (VITAMIN D) 50 MCG (2000 UT) CAPS Take 1 capsule  by mouth daily.     diclofenac sodium (VOLTAREN) 1 % GEL Apply 4 g topically 4 (four) times daily as needed. 500 g 6   famotidine (PEPCID) 20 MG tablet TAKE 1 TABLET TWICE A DAY AS NEEDED FOR REFLUX 180 tablet 3   furosemide (LASIX) 40 MG tablet TAKE 1 TABLET DAILY FOR BLOOD PRESSURE AND FLUID 90 tablet 3   gabapentin (NEURONTIN) 100 MG capsule TAKE 1 CAPSULE THREE TIMES A DAY AS NEEDED FOR  PAINFUL PERIPHERAL NEUROPATHY 270 capsule 3   Multiple Vitamins-Minerals (ICAPS AREDS 2 PO) Take 1 capsule by mouth daily.     Multiple Vitamins-Minerals (MULTIVITAMIN PO) Take by mouth daily.     Omega-3 Fatty Acids (FISH OIL PO) Take by mouth daily.     valsartan (DIOVAN) 160 MG tablet TAKE 1 TABLET AT NIGHT FOR BLOOD PRESSURE 90 tablet 3   mometasone (ELOCON) 0.1 % cream Apply to affected area daily (Patient not taking: Reported on 07/24/2023) 45 g 1   No current facility-administered medications on file prior to visit.    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HCC) 04/03/2022   FHx: heart disease 07/31/2018   Former smoker 07/31/2018   Type 2 diabetes mellitus with stage 4 chronic kidney disease, without long-term current use of insulin (HCC) 07/31/2018   Secondary hyperparathyroidism of renal origin (HCC) 04/10/2018   Screening for heart disease 11/13/2015   Obesity (BMI 30.0-34.9) 11/11/2015   Idiopathic gout 04/11/2015   Vitamin D deficiency 09/29/2014   Medication management 09/29/2014   CKD (chronic kidney disease) stage 4, GFR 15-29 ml/min (HCC) 07/22/2014   Hyperlipidemia associated with type 2 diabetes mellitus (HCC)    Essential hypertension    GERD (gastroesophageal reflux disease)    DDD (degenerative disc disease), lumbar    IBS (irritable bowel syndrome)     Screening Tests Immunization History  Administered Date(s) Administered   DT (Pediatric) 09/29/2014, 09/20/2015   Influenza Split 09/28/2013   Influenza, High Dose Seasonal PF 10/13/2014, 09/03/2016, 10/01/2017, 11/03/2018, 10/10/2020, 10/17/2021, 10/18/2022   Influenza-Unspecified 08/04/2015   PFIZER SARS-COV-2 Pediatric Vaccination 5-60yrs 10/07/2020   PFIZER(Purple Top)SARS-COV-2 Vaccination 01/07/2020, 02/01/2020    Pneumococcal Conjugate-13 10/13/2014   Pneumococcal Polysaccharide-23 09/23/2012   Td 09/02/2004   Zoster, Live 10/13/2013   Preventative care: Last colonoscopy: 2015, DONE due to age unless problems Last mammogram: 05/2023 Last pap smear/pelvic exam: remote, s/p abd hysterectomy   DEXA:2011, 07/2018 T 0.4 normal - Declines further work up.  Education provided on benefit of further screening.  Will continue to monitor.  CXR 2015 US renal 2012  Prior vaccinations: TD or Tdap: 2016 Influenza: Due 09/2022  Pneumococcal: 2013 Prevnar13: 2015 Shingles/Zostavax: 2014 Covid 19: 2/2, 2021, pfizer  Names of Other Physician/Practitioners you currently use: 1. [facility] here for primary care 2. Dr. Lorin Picket, eye doctor, last visit 07/2023 3. Jon Billings, dentist, last visit 02/2023 q6 months   Patient Care Team: Lucky Cowboy, MD as PCP - General (Internal Medicine) Iva Boop, MD as Consulting Physician (Gastroenterology) Fredrich Birks, OD as Referring Physician (Optometry) Zetta Bills, MD as Consulting Physician (Nephrology) seen 12/06/2020 stable f/u 6 months  SURGICAL HISTORY She  has a past surgical history that includes Abdominal hysterectomy; Cataract extraction (Bilateral); and Colonoscopy (x 2). FAMILY HISTORY Her  family history includes Cancer in her mother and sister; Heart attack in her father; Heart disease in her father; Hypertension in her sister; Liver cancer in her mother. SOCIAL HISTORY She  reports that she quit smoking about 19 years ago. Her smoking use included cigarettes. She started smoking about 64 years ago. She has a 15 pack-year smoking history. She has never used smokeless tobacco. She reports that she does not drink alcohol and does not use drugs.   MEDICARE WELLNESS OBJECTIVES: Physical activity: Current Exercise Habits: Home exercise routine Cardiac risk factors:   Depression/mood screen:      07/24/2023    3:15 PM   Depression screen PHQ 2/9  Decreased Interest 0  Down, Depressed, Hopeless 0  PHQ - 2 Score 0    ADLs:     07/24/2023    3:16 PM  In your present state of health, do you have any difficulty performing the following activities:  Hearing? 0  Vision? 0  Difficulty concentrating or making decisions? 0  Walking or climbing stairs? 0  Dressing or bathing? 0  Doing errands, shopping? 0     Cognitive Testing  Alert? Yes  Normal Appearance?Yes  Oriented to person? Yes  Place? Yes   Time? Yes  Recall of three objects?  Yes  Can perform simple calculations? Yes  Displays appropriate judgment?Yes  Can read the correct time from a watch face?Yes  EOL planning:    Review of Systems  Constitutional:  Negative for chills, fever, malaise/fatigue and weight loss.  HENT:  Negative for congestion, hearing loss, sinus pain, sore throat and tinnitus.   Eyes:  Negative for blurred vision and double vision.  Respiratory:  Negative for cough, sputum production, shortness of breath and wheezing.   Cardiovascular:  Negative for chest pain, palpitations, orthopnea, claudication, leg swelling and PND.  Gastrointestinal:  Negative for abdominal pain, blood in stool, constipation, diarrhea, heartburn, melena, nausea and vomiting.  Genitourinary: Negative.   Musculoskeletal:  Positive for joint pain (left knee and feet). Negative for falls and myalgias.  Skin:  Negative for rash.  Neurological:  Negative for dizziness, tingling, tremors, sensory change, loss of consciousness, weakness and headaches.  Endo/Heme/Allergies:  Negative for polydipsia.  Psychiatric/Behavioral: Negative.  Negative for depression, memory loss, substance abuse and suicidal ideas. The patient is not nervous/anxious and does not have insomnia.   All other systems reviewed and are negative.   Objective:     Today's Vitals   07/24/23 1416  BP: (!) 144/62  Pulse: (!) 54  Temp: (!) 97.3 F (36.3 C)  SpO2: 97%  Weight: 178 lb  3.2 oz (80.8 kg)  Height: 5' 2.5" (1.588 m)    Body mass index is 32.07 kg/m.  General appearance: alert, no distress, WD/WN, female HEENT: normocephalic, sclerae anicteric, TMs pearly, nares patent, no discharge or erythema, pharynx normal Oral cavity: MMM, no lesions Neck: supple, no lymphadenopathy, no thyromegaly, no masses Heart: RRR, normal S1, S2, no murmurs Lungs: CTA bilaterally, no wheezes, rhonchi, or rales Abdomen: +bs, soft, non tender, non distended, no masses, no hepatomegaly, no splenomegaly Musculoskeletal: nontender, no swelling, no obvious deformity Extremities: no edema, no cyanosis, no clubbing Pulses: 2+ symmetric, upper and lower extremities, normal cap refill Neurological: alert, oriented x 3, CN2-12 intact, strength normal upper extremities and lower extremities,, DTRs 2+ throughout, no cerebellar signs, gait normal, sensation intact Psychiatric: normal affect, behavior normal, pleasant   Medicare Attestation I have personally reviewed: The patient's medical and social history Their use  of alcohol, tobacco or illicit drugs Their current medications and supplements The patient's functional ability including ADLs,fall risks, home safety risks, cognitive, and hearing and visual impairment Diet and physical activities Evidence for depression or mood disorders  The patient's weight, height, BMI, and visual acuity have been recorded in the chart.  I have made referrals, counseling, and provided education to the patient based on review of the above and I have provided the patient with a written personalized care plan for preventive services.     Adela Glimpse NP [facility]  07/24/2023

## 2023-07-25 LAB — COMPLETE METABOLIC PANEL WITH GFR
AG Ratio: 2.3 (calc) (ref 1.0–2.5)
ALT: 13 U/L (ref 6–29)
AST: 18 U/L (ref 10–35)
Albumin: 4.3 g/dL (ref 3.6–5.1)
Alkaline phosphatase (APISO): 86 U/L (ref 37–153)
BUN/Creatinine Ratio: 22 (calc) (ref 6–22)
BUN: 47 mg/dL — ABNORMAL HIGH (ref 7–25)
CO2: 23 mmol/L (ref 20–32)
Calcium: 10 mg/dL (ref 8.6–10.4)
Chloride: 99 mmol/L (ref 98–110)
Creat: 2.17 mg/dL — ABNORMAL HIGH (ref 0.60–0.95)
Globulin: 1.9 g/dL (ref 1.9–3.7)
Glucose, Bld: 87 mg/dL (ref 65–99)
Potassium: 5.1 mmol/L (ref 3.5–5.3)
Sodium: 134 mmol/L — ABNORMAL LOW (ref 135–146)
Total Bilirubin: 0.7 mg/dL (ref 0.2–1.2)
Total Protein: 6.2 g/dL (ref 6.1–8.1)
eGFR: 22 mL/min/{1.73_m2} — ABNORMAL LOW (ref >=60)

## 2023-07-25 LAB — CBC WITH DIFFERENTIAL/PLATELET
Absolute Monocytes: 603 {cells}/uL (ref 200–950)
Basophils Absolute: 36 {cells}/uL (ref 0–200)
Basophils Relative: 0.4 %
Eosinophils Absolute: 252 {cells}/uL (ref 15–500)
Eosinophils Relative: 2.8 %
HCT: 33.7 % — ABNORMAL LOW (ref 35.0–45.0)
Hemoglobin: 11.4 g/dL — ABNORMAL LOW (ref 11.7–15.5)
Lymphs Abs: 2313 {cells}/uL (ref 850–3900)
MCH: 30.2 pg (ref 27.0–33.0)
MCHC: 33.8 g/dL (ref 32.0–36.0)
MCV: 89.2 fL (ref 80.0–100.0)
MPV: 11.4 fL (ref 7.5–12.5)
Monocytes Relative: 6.7 %
Neutro Abs: 5796 {cells}/uL (ref 1500–7800)
Neutrophils Relative %: 64.4 %
Platelets: 207 10*3/uL (ref 140–400)
RBC: 3.78 10*6/uL — ABNORMAL LOW (ref 3.80–5.10)
RDW: 14.1 % (ref 11.0–15.0)
Total Lymphocyte: 25.7 %
WBC: 9 10*3/uL (ref 3.8–10.8)

## 2023-07-25 LAB — LIPID PANEL
Cholesterol: 160 mg/dL (ref <200)
HDL: 41 mg/dL — ABNORMAL LOW (ref >=50)
LDL Cholesterol (Calc): 89 mg/dL
Non-HDL Cholesterol (Calc): 119 mg/dL (ref <130)
Total CHOL/HDL Ratio: 3.9 (calc) (ref <5.0)
Triglycerides: 206 mg/dL — ABNORMAL HIGH (ref <150)

## 2023-07-25 LAB — HEMOGLOBIN A1C
Hgb A1c MFr Bld: 5.7 %{Hb} — ABNORMAL HIGH (ref <5.7)
Mean Plasma Glucose: 117 mg/dL
eAG (mmol/L): 6.5 mmol/L

## 2023-07-31 ENCOUNTER — Other Ambulatory Visit: Payer: Self-pay | Admitting: Nurse Practitioner

## 2023-07-31 ENCOUNTER — Telehealth: Payer: Self-pay | Admitting: Nurse Practitioner

## 2023-07-31 DIAGNOSIS — M159 Polyosteoarthritis, unspecified: Secondary | ICD-10-CM

## 2023-07-31 NOTE — Telephone Encounter (Signed)
Pt called for results. She also mentioned last time she was here, yall dicussed a cortizone shot for arthritis, she would like to have a referall placed for that pls.

## 2023-08-02 ENCOUNTER — Ambulatory Visit: Payer: Medicare Other | Admitting: Physician Assistant

## 2023-08-02 ENCOUNTER — Other Ambulatory Visit: Payer: Self-pay

## 2023-08-02 ENCOUNTER — Encounter: Payer: Self-pay | Admitting: Physician Assistant

## 2023-08-02 ENCOUNTER — Other Ambulatory Visit (INDEPENDENT_AMBULATORY_CARE_PROVIDER_SITE_OTHER): Payer: Medicare Other

## 2023-08-02 DIAGNOSIS — M79672 Pain in left foot: Secondary | ICD-10-CM

## 2023-08-02 DIAGNOSIS — M25562 Pain in left knee: Secondary | ICD-10-CM | POA: Diagnosis not present

## 2023-08-02 MED ORDER — LIDOCAINE HCL 1 % IJ SOLN
2.0000 mL | INTRAMUSCULAR | Status: AC | PRN
Start: 2023-08-02 — End: 2023-08-02
  Administered 2023-08-02: 2 mL

## 2023-08-02 MED ORDER — METHYLPREDNISOLONE ACETATE 40 MG/ML IJ SUSP
40.0000 mg | INTRAMUSCULAR | Status: AC | PRN
Start: 2023-08-02 — End: 2023-08-02
  Administered 2023-08-02: 40 mg via INTRA_ARTICULAR

## 2023-08-02 NOTE — Progress Notes (Signed)
Office Visit Note   Patient: Kelly Cantu           Date of Birth: May 05, 1941           MRN: [id number] Visit Date: 08/02/2023              Requested by: Adela Glimpse, NP [address] [place],  [place] [address] PCP: Lucky Cowboy, MD   Assessment & Plan: Visit Diagnoses:  1. Acute pain of left knee   2. Left foot pain     Plan: Patient is a pleasant 82 year old woman who presents today with a chief complaint of left knee pain and left great toe pain.  Denies any injury.  She does have a previous history of gout in her left great toe and does take allopurinol.  Denies any lifestyle changes or changes in activity.  She has not had problems with her left knee before.  She has tried some topical medication has not really helped.  She denies instability.  Given her exam she may have a flare of her gout.  She has not had a uric acid recently and I will try 1 today.  I would defer to her primary care provider to treat her as she does have a history of chronic kidney disease.  With regards to her knee she does have tricompartmental arthritis does have a small effusion.  We talked about options.  Her pain is moderate and we discussed trying a steroid injection today as well as some physical therapy as she cannot take anti-inflammatories.  Follow-Up Instructions: No follow-ups on file.   Orders:  Orders Placed This Encounter  Procedures  . XR Knee 1-2 Views Left  . XR Foot 2 Views Left  . Uric acid  . Ambulatory referral to Physical Therapy   No orders of the defined types were placed in this encounter.     Procedures: Large Joint Inj on 08/02/2023 2:21 PM Indications: pain and diagnostic evaluation Details: 25 G 1.5 in needle, anteromedial approach  Arthrogram: No  Medications: 2 mL lidocaine 1 %; 40 mg methylPREDNISolone acetate 40 MG/ML Outcome: tolerated well, no immediate complications Procedure, treatment alternatives, risks and benefits explained, specific  risks discussed. Consent was given by the patient.     Clinical Data: No additional findings.   Subjective:  Review of Systems  All other systems reviewed and are negative.  HPI; patient is a pleasant 82 year old woman with a chief complaint of left knee pain for about a month.  No particular injury.  Also some swelling.  Also is complaining of left great toe pain.  Denies any injuries does have a previous history of gout in her left great toe and has been taking allopurinol.  Denies any fever chills  Objective: Vital Signs: There were no vitals taken for this visit.  Physical Exam Constitutional:      Appearance: Normal appearance.  Pulmonary:     Effort: Pulmonary effort is normal.  Neurological:     General: No focal deficit present.     Mental Status: She is alert and oriented to person, place, and time.  Psychiatric:        Mood and Affect: Mood normal.        Behavior: Behavior normal.    Ortho Exam Examination of her left foot she has no swelling a strong dorsalis pedis pulse no real erythema.  She does have some stiffness and pain over the first MTP joint.  Also has some noted  fat pad atrophy. Neurovascular intact. Left knee she has a mild effusion.  No erythema compartments are soft and compressible negative Denna Haggard' sign she has good flexion and extension of her leg.  She does have tenderness more over the medial joint line and the lateral joint Specialty Comments:  No specialty comments available.  Imaging: No results found.   PMFS History: Patient Active Problem List   Diagnosis Date Noted  . Painful diabetic neuropathy (HCC) 04/03/2022  . FHx: heart disease 07/31/2018  . Former smoker 07/31/2018  . Type 2 diabetes mellitus with stage 4 chronic kidney disease, without long-term current use of insulin (HCC) 07/31/2018  . Secondary hyperparathyroidism of renal origin (HCC) 04/10/2018  . Screening for heart disease 11/13/2015  . Obesity (BMI 30.0-34.9)  11/11/2015  . Idiopathic gout 04/11/2015  . Vitamin D deficiency 09/29/2014  . Medication management 09/29/2014  . CKD (chronic kidney disease) stage 4, GFR 15-29 ml/min (HCC) 07/22/2014  . Hyperlipidemia associated with type 2 diabetes mellitus (HCC)   . Essential hypertension   . GERD (gastroesophageal reflux disease)   . DDD (degenerative disc disease), lumbar   . IBS (irritable bowel syndrome)    Past Medical History:  Diagnosis Date  . Cataract   . Chronic kidney disease   . DDD (degenerative disc disease), lumbar   . DJD (degenerative joint disease)   . GERD (gastroesophageal reflux disease)   . Hyperlipidemia   . Hypertension   . IBS (irritable bowel syndrome)   . Prediabetes     Family History  Problem Relation Age of Onset  . Cancer Mother        liver  . Liver cancer Mother   . Heart disease Father   . Heart attack Father   . Hypertension Sister   . Cancer Sister        kidney  . Colon cancer Neg Hx   . Breast cancer Neg Hx     Past Surgical History:  Procedure Laterality Date  . ABDOMINAL HYSTERECTOMY    . CATARACT EXTRACTION Bilateral   . COLONOSCOPY  x 2   w/Dr Leone Payor   Social History   Occupational History  . Not on file  Tobacco Use  . Smoking status: Former    Current packs/day: 0.00    Average packs/day: 0.3 packs/day for 45.5 years (15.0 ttl pk-yrs)    Types: Cigarettes    Start date: 37    Quit date: 06/16/2004    Years since quitting: 19.1  . Smokeless tobacco: Never  Substance and Sexual Activity  . Alcohol use: No  . Drug use: No  . Sexual activity: Not on file

## 2023-08-03 LAB — URIC ACID: Uric Acid, Serum: 2.8 mg/dL (ref 2.5–7.0)

## 2023-08-14 DIAGNOSIS — E782 Mixed hyperlipidemia: Secondary | ICD-10-CM | POA: Diagnosis not present

## 2023-08-14 DIAGNOSIS — N1832 Chronic kidney disease, stage 3b: Secondary | ICD-10-CM | POA: Diagnosis not present

## 2023-08-14 DIAGNOSIS — I129 Hypertensive chronic kidney disease with stage 1 through stage 4 chronic kidney disease, or unspecified chronic kidney disease: Secondary | ICD-10-CM | POA: Diagnosis not present

## 2023-08-14 DIAGNOSIS — D631 Anemia in chronic kidney disease: Secondary | ICD-10-CM | POA: Diagnosis not present

## 2023-08-14 DIAGNOSIS — M109 Gout, unspecified: Secondary | ICD-10-CM | POA: Diagnosis not present

## 2023-08-14 DIAGNOSIS — E669 Obesity, unspecified: Secondary | ICD-10-CM | POA: Diagnosis not present

## 2023-08-14 DIAGNOSIS — N2581 Secondary hyperparathyroidism of renal origin: Secondary | ICD-10-CM | POA: Diagnosis not present

## 2023-08-26 NOTE — Progress Notes (Shared)
[facility] - Clinic Note  08/30/2023     CHIEF COMPLAINT Patient presents for Retina Follow Up   HISTORY OF PRESENT ILLNESS: Kelly Cantu is a 82 y.o. female who presents to the clinic today for:   HPI     Retina Follow Up   Patient presents with  Dry AMD.  In both eyes.  This started 9 months ago.        Comments   Patient here for 9 months retina follow up for non exu ARMD OU. Patient states vision doing fine. Sometimes blurrier than other times. Has itching from allergies. Uses AT's prn.      Last edited by Laddie Aquas, COA on 08/30/2023  1:55 PM.     Patient states that the vision is doing well. She sees Dr. Fredrich Birks, he did not give a glasses rx. She is taking Preservision and using the Amsler grid.   Referring physician: Lucky Cowboy, MD [address] [place],  [place] [address]  HISTORICAL INFORMATION:   Selected notes from the MEDICAL RECORD NUMBER Referred by Dr. Fredrich Birks for concern of ARMD LEE:  Ocular Hx- PMH-    CURRENT MEDICATIONS: No current outpatient medications on file. (Ophthalmic Drugs)   No current facility-administered medications for this visit. (Ophthalmic Drugs)   Current Outpatient Medications (Other)  Medication Sig   allopurinol (ZYLOPRIM) 300 MG tablet TAKE 1 TABLET BY MOUTH EVERY DAY   atenolol (TENORMIN) 50 MG tablet TAKE 1 TABLET DAILY FOR BLOOD PRESSURE   atorvastatin (LIPITOR) 20 MG tablet TAKE 1 TABLET DAILY   BABY ASPIRIN PO Take 81 mg by mouth daily.   Cholecalciferol (VITAMIN D) 50 MCG (2000 UT) CAPS Take 1 capsule by mouth daily.   diclofenac sodium (VOLTAREN) 1 % GEL Apply 4 g topically 4 (four) times daily as needed.   famotidine (PEPCID) 20 MG tablet TAKE 1 TABLET TWICE A DAY AS NEEDED FOR REFLUX   furosemide (LASIX) 40 MG tablet TAKE 1 TABLET DAILY FOR BLOOD PRESSURE AND FLUID   gabapentin (NEURONTIN) 100 MG capsule TAKE 1 CAPSULE THREE TIMES A DAY AS NEEDED FOR PAINFUL  PERIPHERAL NEUROPATHY   Multiple Vitamins-Minerals (ICAPS AREDS 2 PO) Take 1 capsule by mouth daily.   Multiple Vitamins-Minerals (MULTIVITAMIN PO) Take by mouth daily.   Omega-3 Fatty Acids (FISH OIL PO) Take by mouth daily.   valsartan (DIOVAN) 160 MG tablet TAKE 1 TABLET AT NIGHT FOR BLOOD PRESSURE   mometasone (ELOCON) 0.1 % cream Apply to affected area daily (Patient not taking: Reported on 08/30/2023)   No current facility-administered medications for this visit. (Other)   REVIEW OF SYSTEMS: ROS   Positive for: Eyes Last edited by Laddie Aquas, COA on 08/30/2023  1:55 PM.      ALLERGIES Allergies  Allergen Reactions   Ace Inhibitors     Pt unsure of reaction   Feldene [Piroxicam] Rash   PAST MEDICAL HISTORY Past Medical History:  Diagnosis Date   Cataract    Chronic kidney disease    DDD (degenerative disc disease), lumbar    DJD (degenerative joint disease)    GERD (gastroesophageal reflux disease)    Hyperlipidemia    Hypertension    IBS (irritable bowel syndrome)    Prediabetes    Past Surgical History:  Procedure Laterality Date   ABDOMINAL HYSTERECTOMY     CATARACT EXTRACTION Bilateral    COLONOSCOPY  x 2   w/Dr Leone Payor   FAMILY  HISTORY Family History  Problem Relation Age of Onset   Cancer Mother        liver   Liver cancer Mother    Heart disease Father    Heart attack Father    Hypertension Sister    Cancer Sister        kidney   Colon cancer Neg Hx    Breast cancer Neg Hx    SOCIAL HISTORY Social History   Tobacco Use   Smoking status: Former    Current packs/day: 0.00    Average packs/day: 0.3 packs/day for 45.5 years (15.0 ttl pk-yrs)    Types: Cigarettes    Start date: 21    Quit date: 06/16/2004    Years since quitting: 19.2   Smokeless tobacco: Never  Substance Use Topics   Alcohol use: No   Drug use: No       OPHTHALMIC EXAM:  Base Eye Exam     Visual Acuity (Snellen - Linear)       Right Left   Dist [place] 20/40  20/40   Dist ph [place] 20/30 -1 NI         Tonometry (Tonopen, 1:52 PM)       Right Left   Pressure 12 10         Pupils       Dark Light Shape React APD   Right 3 2 Round Brisk None   Left 3 2 Round Brisk None         Visual Fields (Counting fingers)       Left Right    Full Full         Extraocular Movement       Right Left    Full, Ortho Full, Ortho         Neuro/Psych     Oriented x3: Yes   Mood/Affect: Normal         Dilation     Both eyes: 1.0% Mydriacyl, 2.5% Phenylephrine @ 1:52 PM           Slit Lamp and Fundus Exam     Slit Lamp Exam       Right Left   Lids/Lashes Dermatochalasis - upper lid Dermatochalasis - upper lid   Conjunctiva/Sclera White and quiet White and quiet   Cornea 1+ Punctate epithelial erosions, trace tear film debris, well healed cataract wound 1+ Punctate epithelial erosions, trace tear film debris, well healed cataract wound   Anterior Chamber deep and clear deep and clear   Iris Round and dilated Round and dilated   Lens PC IOL in good position with open PC PC IOL in good position with open PC   Anterior Vitreous mild syneresis, Posterior vitreous detachment mild syneresis, Posterior vitreous detachment         Fundus Exam       Right Left   Disc mild Pallor, Sharp rim, mild PPA 1-2 Pallor, Sharp rim   C/D Ratio 0.4 0.5   Macula Flat, Blunted foveal reflex, Drusen, RPE mottling and clumping, No heme or edema Flat, Blunted foveal reflex, fine Drusen, RPE mottling and clumping, central vitelliform like lesion, No heme or edema   Vessels attenuated, Tortuous attenuated, Tortuous   Periphery Attached, reticular degeneration, No heme Attached, reticular degeneration, No heme           IMAGING AND PROCEDURES  Imaging and Procedures for 08/30/2023  OCT, Retina - OU - Both Eyes       Right Eye Quality  was good. Central Foveal Thickness: 297. Progression has been stable. Findings include normal foveal  contour, no IRF, no SRF, retinal drusen , subretinal hyper-reflective material (Central vitelliform like lesion).   Left Eye Quality was good. Central Foveal Thickness: 328. Progression has been stable. Findings include no IRF, no SRF, abnormal foveal contour, retinal drusen , subretinal hyper-reflective material, pigment epithelial detachment (Central vitelliform like lesion).   Notes *Images captured and stored on drive  Diagnosis / Impression:  Non-exu ARMD OU Central vitelliform lesion OU -- stable  Clinical management:  See below  Abbreviations: NFP - Normal foveal profile. CME - cystoid macular edema. PED - pigment epithelial detachment. IRF - intraretinal fluid. SRF - subretinal fluid. EZ - ellipsoid zone. ERM - epiretinal membrane. ORA - outer retinal atrophy. ORT - outer retinal tubulation. SRHM - subretinal hyper-reflective material. IRHM - intraretinal hyper-reflective material             ASSESSMENT/PLAN:    ICD-10-CM   1. Intermediate stage nonexudative age-related macular degeneration of both eyes  H35.3132 OCT, Retina - OU - Both Eyes    2. Essential hypertension  I10     3. Hypertensive retinopathy of both eyes  H35.033     4. Pseudophakia, both eyes  Z96.1      Age related macular degeneration, non-exudative, both eyes  - intermediate stage with central vitelliform like lesions OU -- stable - The incidence, anatomy, and pathology of dry AMD, risk of progression, and the AREDS and AREDS 2 study including smoking risks discussed with patient.  - cont amsler grid monitoring  - f/u 9 months, DFE, OCT  2,3. Hypertensive retinopathy OU - discussed importance of tight BP control - monitor  4. Pseudophakia OU  - s/p CE/IOL OU (Dr. Vonna Kotyk)  - IOLs in good position, doing well  - monitor  Ophthalmic Meds Ordered this visit:  No orders of the defined types were placed in this encounter.    Return in about 9 months (around 05/29/2024) for f/u Non Ex. AMD OU,  DFE, OCT.  There are no Patient Instructions on file for this visit.  Explained the diagnoses, plan, and follow up with the patient and they expressed understanding.  Patient expressed understanding of the importance of proper follow up care.  This document serves as a record of services personally performed by Karie Chimera, MD, PhD. It was created on their behalf by Charlette Caffey, COT an ophthalmic technician. The creation of this record is the provider's dictation and/or activities during the visit.    Electronically signed by:  Charlette Caffey, COT  08/30/23 2:48 PM   Karie Chimera, M.D., Ph.D. Diseases & Surgery of the Retina and Vitreous [facility]     Abbreviations: M myopia (nearsighted); A astigmatism; H hyperopia (farsighted); P presbyopia; Mrx spectacle prescription;  CTL contact lenses; OD right eye; OS left eye; OU both eyes  XT exotropia; ET esotropia; PEK punctate epithelial keratitis; PEE punctate epithelial erosions; DES dry eye syndrome; MGD meibomian gland dysfunction; ATs artificial tears; PFAT's preservative free artificial tears; NSC nuclear sclerotic cataract; PSC posterior subcapsular cataract; ERM epi-retinal membrane; PVD posterior vitreous detachment; RD retinal detachment; DM diabetes mellitus; DR diabetic retinopathy; NPDR non-proliferative diabetic retinopathy; PDR proliferative diabetic retinopathy; CSME clinically significant macular edema; DME diabetic macular edema; dbh dot blot hemorrhages; CWS cotton wool spot; POAG primary open angle glaucoma; C/D cup-to-disc ratio; HVF humphrey visual field; GVF goldmann visual field; OCT optical coherence tomography;  IOP intraocular pressure; BRVO Branch retinal vein occlusion; CRVO central retinal vein occlusion; CRAO central retinal artery occlusion; BRAO branch retinal artery occlusion; RT retinal tear; SB scleral buckle; PPV pars plana vitrectomy; VH Vitreous hemorrhage; PRP panretinal  laser photocoagulation; IVK intravitreal kenalog; VMT vitreomacular traction; MH Macular hole;  NVD neovascularization of the disc; NVE neovascularization elsewhere; AREDS age related eye disease study; ARMD age related macular degeneration; POAG primary open angle glaucoma; EBMD epithelial/anterior basement membrane dystrophy; ACIOL anterior chamber intraocular lens; IOL intraocular lens; PCIOL posterior chamber intraocular lens; Phaco/IOL phacoemulsification with intraocular lens placement; PRK photorefractive keratectomy; LASIK laser assisted in situ keratomileusis; HTN hypertension; DM diabetes mellitus; COPD chronic obstructive pulmonary disease

## 2023-08-30 ENCOUNTER — Encounter (INDEPENDENT_AMBULATORY_CARE_PROVIDER_SITE_OTHER): Payer: Self-pay | Admitting: Ophthalmology

## 2023-08-30 ENCOUNTER — Ambulatory Visit (INDEPENDENT_AMBULATORY_CARE_PROVIDER_SITE_OTHER): Payer: Medicare Other | Admitting: Ophthalmology

## 2023-08-30 DIAGNOSIS — H353132 Nonexudative age-related macular degeneration, bilateral, intermediate dry stage: Secondary | ICD-10-CM | POA: Diagnosis not present

## 2023-08-30 DIAGNOSIS — H35033 Hypertensive retinopathy, bilateral: Secondary | ICD-10-CM | POA: Diagnosis not present

## 2023-08-30 DIAGNOSIS — I10 Essential (primary) hypertension: Secondary | ICD-10-CM | POA: Diagnosis not present

## 2023-08-30 DIAGNOSIS — Z961 Presence of intraocular lens: Secondary | ICD-10-CM | POA: Diagnosis not present

## 2023-08-31 ENCOUNTER — Encounter (INDEPENDENT_AMBULATORY_CARE_PROVIDER_SITE_OTHER): Payer: Self-pay | Admitting: Ophthalmology

## 2023-10-21 ENCOUNTER — Encounter: Payer: Self-pay | Admitting: Internal Medicine

## 2023-10-21 NOTE — Progress Notes (Incomplete)
[facility]  Lucky Cowboy, M.D.        Rance Muir, D.NP      Adela Glimpse, D.NP  [facility] [address]  [place], [place]. [address] Telephone [phone] Telefax [phone]   Future Appointments  Date Time Provider Department  10/22/2023                 6 mo ov 11:30 AM Lucky Cowboy, MD [facility]  04/13/2024                    cpe  3:00 PM Lucky Cowboy, MD [facility]  05/29/2024  2:00 PM Rennis Chris, MD [facility]  07/23/2024               3 mo/wellness     2:00 PM Adela Glimpse, NP [facility]    History of Present Illness:      This very nice 82 y.o.female presents for 3 month follow up with  HTN, HLD, T2_NIDDM /XBM8 and Vitamin D Deficiency. Patient's Gout is controlled on Allopurinol & likewise her GERD is controlled on her famotidine.         Patient is treated for HTN  (1991)  & BP has been controlled at home. Today's  . Patient has had no complaints of any cardiac type chest pain, palpitations, dyspnea Pollyann Kennedy /PND, dizziness, claudication  or dependent edema.        Hyperlipidemia is controlled with diet & Atorvastatin. Patient denies myalgias or other med SE's. 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  She follows with Dr Zetta Bills /Nephrology who also monitors her consequent secondary Hyperparathyroidism of renal disease.   Patient denies reactive hypoglycemic symptoms, visual blurring or diabetic polys , but she is on Gabapentin for painful Diabetic Neuropathy.  Last A1c was  near goal :  Lab Results  Component Value Date   HGBA1C 5.7 (H) 07/24/2023                                                        Further, the patient also has history of Vitamin D Deficiency  ("34" /2008) and supplements vitamin D . Last vitamin D was near goal (70-100) :  Lab Results  Component Value Date   VD25OH 52 04/05/2023     Current Outpatient Medications on File Prior to Visit  Medication Sig  . allopurinol 300 MG tablet TAKE 1 TABLET  EVERY DAY  . atenolol  50 MG tablet TAKE 1 TABLET DAILY   . atorvastatin 20 MG tablet TAKE 1 TABLET DAILY  . BABY ASPIRIN  81 mg   Take daily.  Marland Kitchen VITAMIN D 2000 u Take 1 capsule  daily.  . diclofenac  1 % GEL Apply 4 g topically 4  times daily as needed.  . famotidine  20 MG tablet TAKE 1 TABLET 2 x / day AS NEEDED   . furosemide 40 MG tablet  TAKE 1 TABLET DAILY   . gabapentin 100 MG capsule TAKE 1 CAPSULE THREE TIMES A DAY   . I-CAPS AREDS 2  Take 1 capsule  daily.  . Multiple Vitamins-Minerals  Take daily.  . Omega-3 FISH OIL Take  daily.  . Valsartan 160 MG tablet TAKE 1 TABLET AT NIGHT     Allergies  Allergen Reactions  . Ace Inhibitors     Pt unsure of reaction  . Feldene [Piroxicam] Rash     PMHx:   Past Medical History:  Diagnosis Date  . Cataract   . Chronic kidney disease   . DDD (degenerative disc disease), lumbar   . DJD (degenerative joint disease)   . GERD (gastroesophageal reflux disease)   . Hyperlipidemia   . Hypertension   . IBS (irritable bowel syndrome)   . Prediabetes      Immunization History  Administered Date(s) Administered  . DT ( 09/29/2014, 09/20/2015  . Influenza Split 09/28/2013  . Influenza, High Dose  111/07/2020, 10/17/2021, 10/18/2022  . Influenza 08/04/2015  . PFIZER SARS-COV-2  10/07/2020  . PFIZER-SARS-COV-2 Vacc 01/07/2020, 02/01/2020  . Pneumococcal -13 10/13/2014  . Pneumococcal -23 09/23/2012  . Td 09/02/2004  . Zoster, Live 10/13/2013     Past Surgical History:  Procedure Laterality Date  . ABDOMINAL HYSTERECTOMY    . CATARACT EXTRACTION Bilateral   . COLONOSCOPY  x 2    w/Dr Leone Payor     FHx:    Reviewed / unchanged   SHx:    Reviewed / unchanged    Systems Review:  Constitutional: Denies fever, chills, wt changes, headaches, insomnia, fatigue, night sweats, change in appetite. Eyes: Denies redness, blurred vision, diplopia, discharge, itchy, watery eyes.  ENT: Denies discharge, congestion, post nasal drip, epistaxis, sore throat, earache, hearing loss, dental pain, tinnitus, vertigo, sinus pain, snoring.  CV: Denies chest pain, palpitations, irregular heartbeat, syncope, dyspnea, diaphoresis, orthopnea, PND, claudication or edema. Respiratory: denies cough, dyspnea, DOE, pleurisy, hoarseness, laryngitis, wheezing.  Gastrointestinal: Denies dysphagia, odynophagia, heartburn, reflux, water brash, abdominal pain or cramps, nausea, vomiting, bloating, diarrhea, constipation, hematemesis, melena, hematochezia  or hemorrhoids. Genitourinary: Denies dysuria, frequency, urgency, nocturia, hesitancy, discharge, hematuria or flank pain. Musculoskeletal: Denies arthralgias, myalgias, stiffness, jt. swelling, pain, limping or strain/sprain.  Skin: Denies pruritus, rash, hives, warts, acne, eczema or change in skin lesion(s). Neuro: No weakness, tremor, incoordination, spasms, paresthesia or pain. Psychiatric: Denies confusion, memory loss or sensory loss. Endo: Denies change in weight, skin or hair change.  Heme/Lymph: No excessive bleeding, bruising or enlarged lymph nodes.   Physical Exam  There were no vitals taken for this visit.  Appears  well nourished, well groomed  and in no distress.  Eyes: PERRLA, EOMs, conjunctiva no swelling or erythema. Sinuses: No frontal/maxillary tenderness ENT/Mouth: EAC's clear, TM's nl w/o erythema, bulging. Nares clear w/o erythema, swelling, exudates. Oropharynx clear without erythema or exudates. Oral hygiene is good. Tongue normal, non obstructing. Hearing intact.  Neck: Supple. Thyroid not palpable. Car 2+/2+  without bruits, nodes or JVD. Chest: Respirations nl with BS clear & equal w/o rales, rhonchi, wheezing or stridor.  Cor: Heart sounds normal w/ regular rate and rhythm without sig. murmurs, gallops, clicks or rubs. Peripheral pulses normal and equal  without edema.  Abdomen: Soft & bowel sounds normal. Non-tender w/o guarding, rebound, hernias, masses or organomegaly.  Lymphatics: Unremarkable.  Musculoskeletal: Full ROM all peripheral extremities, joint stability, 5/5 strength and normal gait.  Skin: Warm, dry without exposed rashes, lesions or ecchymosis  apparent.  Neuro: Cranial nerves intact, reflexes equal bilaterally. Sensory-motor testing grossly intact. Tendon reflexes grossly intact.  Pysch: Alert & oriented x 3.  Insight and judgement nl & appropriate. No ideations.   Assessment and Plan:  - Continue medication, monitor blood pressure at home.  - Continue DASH diet.  Reminder to go to the ER if any CP,  SOB, nausea, dizziness, severe HA, changes vision/speech.  - Continue diet/meds, exercise,& lifestyle modifications.  - Continue monitor periodic cholesterol/liver & renal functions    - Continue diet, exercise  - Lifestyle modifications.  - Monitor appropriate labs  - Continue supplementation        Discussed  regular exercise, BP monitoring, weight control to achieve/maintain BMI less than 25 and discussed med and SE's. Recommended labs to assess /monitor clinical status .  I discussed the assessment and treatment plan with the patient. The patient was provided an opportunity to ask questions and all were answered. The patient agreed with the plan and demonstrated an understanding of the instructions.  I provided over 30 minutes of exam, counseling, chart review and  complex critical decision making.        The patient was advised to call back or seek an in-person evaluation if the symptoms worsen or if the condition fails to improve as anticipated.   Marinus Maw,  MD

## 2023-10-21 NOTE — Progress Notes (Signed)
[facility]  Lucky Cowboy, M.D.        Rance Muir, D.NP      Adela Glimpse, D.NP  [facility] [address]  [place], [place]. [address] Telephone [phone] Telefax [phone]   Future Appointments  Date Time Provider Department  10/22/2023                 6 mo ov 11:30 AM Lucky Cowboy, MD [facility]  04/13/2024                    cpe  3:00 PM Lucky Cowboy, MD [facility]  05/29/2024  2:00 PM Rennis Chris, MD T[facility]TRE  07/23/2024               3 mo/wellness     2:00 PM Adela Glimpse, NP [facility]    History of Present Illness:      This very nice 82 y.o.  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 Patient's Gout is controlled on Allopurinol & likewise her GERD is controlled on her famotidine.         Patient is treated for HTN  (1991)  & BP has been controlled at home. Today's BP is at goal - 138/80 . Patient has had no complaints of any cardiac type chest pain, palpitations, dyspnea Pollyann Kennedy /PND, dizziness, claudication  or dependent edema.        Hyperlipidemia is controlled with diet & Atorvastatin. Patient denies myalgias or other med SE's. 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  She follows with Dr Zetta Bills /Nephrology who also monitors her consequent secondary Hyperparathyroidism of renal disease.   Patient denies reactive hypoglycemic symptoms, visual blurring or diabetic polys , but she is on low dose Gabapentin for painful Diabetic Neuropathy.  Last A1c was  near goal :  Lab Results  Component Value Date   HGBA1C 5.7 (H) 07/24/2023                                                        Further, the patient also has history of Vitamin D Deficiency  ("34" /2008) and supplements vitamin D . Last vitamin D was near goal (70-100) :  Lab Results  Component Value Date   VD25OH 52 04/05/2023     Current Outpatient Medications on File Prior to Visit  Medication Sig   allopurinol 300 MG tablet TAKE 1 TABLET  EVERY DAY   atenolol  50 MG tablet TAKE 1 TABLET DAILY    atorvastatin 20 MG tablet TAKE 1 TABLET DAILY   BABY ASPIRIN  81 mg   Take daily.   VITAMIN D 2000 u Take 1 capsule  daily.   diclofenac  1 % GEL Apply 4 g topically 4  times daily as needed.   famotidine  20 MG tablet TAKE 1 TABLET 2 x / day  AS NEEDED    furosemide 40 MG tablet TAKE 1 TABLET DAILY    gabapentin 100 MG capsule TAKE 1 CAPSULE THREE TIMES A DAY    I-CAPS AREDS 2  Take 1 capsule  daily.   Multiple Vitamins-Minerals  Take daily.   Omega-3 FISH OIL Take  daily.   Valsartan 160 MG tablet TAKE 1 TABLET AT NIGHT     Allergies  Allergen Reactions   Ace Inhibitors     Pt unsure of reaction   Feldene [Piroxicam] Rash     PMHx:   Past Medical History:  Diagnosis Date   Cataract    Chronic kidney disease    DDD (degenerative disc disease), lumbar    DJD (degenerative joint disease)    GERD (gastroesophageal reflux disease)    Hyperlipidemia    Hypertension    IBS (irritable bowel syndrome)    Prediabetes      Immunization History  Administered Date(s) Administered   DT ( 09/29/2014, 09/20/2015   Influenza Split 09/28/2013   Influenza, High Dose  111/07/2020, 10/17/2021, 10/18/2022   Influenza 08/04/2015   PFIZER SARS-COV-2  10/07/2020   PFIZER-SARS-COV-2 Vacc 01/07/2020, 02/01/2020   Pneumococcal -13 10/13/2014   Pneumococcal -23 09/23/2012   Td 09/02/2004   Zoster, Live 10/13/2013     Past Surgical History:  Procedure Laterality Date   ABDOMINAL HYSTERECTOMY     CATARACT EXTRACTION Bilateral    COLONOSCOPY  x 2   w/Dr  Leone Payor     FHx:    Reviewed / unchanged   SHx:    Reviewed / unchanged    Systems Review:  Constitutional: Denies fever, chills, wt changes, headaches, insomnia, fatigue, night sweats, change in appetite. Eyes: Denies redness, blurred vision, diplopia, discharge, itchy, watery eyes.  ENT: Denies discharge, congestion, post nasal drip, epistaxis, sore throat, earache, hearing loss, dental pain, tinnitus, vertigo, sinus pain, snoring.  CV: Denies chest pain, palpitations, irregular heartbeat, syncope, dyspnea, diaphoresis, orthopnea, PND, claudication or edema. Respiratory: denies cough, dyspnea, DOE, pleurisy, hoarseness, laryngitis, wheezing.  Gastrointestinal: Denies dysphagia, odynophagia, heartburn, reflux, water brash, abdominal pain or cramps, nausea, vomiting, bloating, diarrhea, constipation, hematemesis, melena, hematochezia  or hemorrhoids. Genitourinary: Denies dysuria, frequency, urgency, nocturia, hesitancy, discharge, hematuria or flank pain. Musculoskeletal: Denies arthralgias, myalgias, stiffness, jt. swelling, pain, limping or strain/sprain.  Skin: Denies pruritus, rash, hives, warts, acne, eczema or change in skin lesion(s). Neuro: No weakness, tremor, incoordination, spasms, paresthesia or pain. Psychiatric: Denies confusion, memory loss or sensory loss. Endo: Denies change in weight, skin or hair change.  Heme/Lymph: No excessive bleeding, bruising or enlarged lymph nodes.   Physical Exam  BP 138/80   Pulse (!) 53   Temp 97.9 F (36.6 C)   Resp 16   Ht 5' 2.5" (1.588 m)   Wt 176 lb 9.6 oz (80.1 kg)   SpO2 97%   BMI 31.79 kg/m   Appears  well nourished, well groomed  and in no distress.  Eyes: PERRLA, EOMs, conjunctiva no swelling or erythema. Sinuses: No frontal/maxillary tenderness ENT/Mouth: EAC's clear, TM's nl w/o erythema, bulging. Nares clear w/o erythema, swelling, exudates. Oropharynx clear without erythema or exudates. Oral hygiene is good.  Tongue normal, non obstructing. Hearing intact.  Neck: Supple. Thyroid not palpable. Car 2+/2+ without bruits, nodes or JVD. Chest: Respirations nl with BS clear & equal w/o rales, rhonchi, wheezing or stridor.  Cor: Heart sounds normal w/ regular rate and rhythm without sig. murmurs, gallops, clicks or rubs. Peripheral pulses normal and equal  without edema.  Abdomen: Soft & bowel sounds normal. Non-tender w/o guarding, rebound, hernias, masses or organomegaly.  Lymphatics: Unremarkable.  Musculoskeletal: Full ROM all peripheral extremities, joint stability, 5/5 strength and normal gait.  Skin: Warm, dry without exposed rashes, lesions or ecchymosis apparent.  Neuro: Cranial nerves intact, reflexes equal bilaterally. Sensory-motor testing grossly intact. Tendon reflexes grossly intact.  Pysch: Alert & oriented x 3.  Insight and judgement nl & appropriate. No ideations.   Assessment and Plan:  1. Essential hypertension  - Continue medication, monitor blood pressure at home.  - Continue DASH diet.  Reminder to go to the ER if any CP,  SOB, nausea, dizziness, severe HA, changes vision/speech.   - CBC with Differential/Platelet - COMPLETE METABOLIC PANEL WITH GFR - Magnesium - TSH   2. Hyperlipidemia associated with type 2 diabetes mellitus (HCC)  - Continue diet/meds, exercise,& lifestyle modifications.  - Continue monitor periodic cholesterol/liver & renal functions     - Lipid panel - TSH   3. 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  - Monitor appropriate labs   - Hemoglobin A1c - Insulin, random   4. Vitamin D deficiency  - Continue supplementation   - VITAMIN D 25 Hydroxy    5. Secondary hyperparathyroidism of renal origin (HCC)  - Parathyroid hormone, intact (no Ca)   6. Idiopathic gout  - Uric  acid   7. Gastroesophageal reflux disease, unspecified whether esophagitis present  - CBC with Differential/Platelet   8. Medication management  - CBC with Differential/Platelet - COMPLETE METABOLIC PANEL WITH GFR - Magnesium - Lipid panel - TSH - Hemoglobin A1c - Insulin, random - VITAMIN D 25 Hydroxy  - Uric acid - Parathyroid hormone, intact (no Ca)          Discussed  regular exercise, BP monitoring, weight control to achieve/maintain BMI less than 25 and discussed med and SE's. Recommended labs to assess /monitor clinical status .  I discussed the assessment and treatment plan with the patient. The patient was provided an opportunity to ask questions and all were answered. The patient agreed with the plan and demonstrated an understanding of the instructions.  I provided over 30 minutes of exam, counseling, chart review and  complex critical decision making.        The patient was advised to call back or seek an in-person evaluation if the symptoms worsen or if the condition fails to improve as anticipated.   Marinus Maw, MD

## 2023-10-21 NOTE — Patient Instructions (Signed)

## 2023-10-22 ENCOUNTER — Ambulatory Visit (INDEPENDENT_AMBULATORY_CARE_PROVIDER_SITE_OTHER): Payer: Medicare Other | Admitting: Internal Medicine

## 2023-10-22 ENCOUNTER — Encounter: Payer: Self-pay | Admitting: Internal Medicine

## 2023-10-22 VITALS — BP 138/80 | HR 53 | Temp 97.9°F | Resp 16 | Ht 62.5 in | Wt 176.6 lb

## 2023-10-22 DIAGNOSIS — E785 Hyperlipidemia, unspecified: Secondary | ICD-10-CM | POA: Diagnosis not present

## 2023-10-22 DIAGNOSIS — Z79899 Other long term (current) drug therapy: Secondary | ICD-10-CM | POA: Diagnosis not present

## 2023-10-22 DIAGNOSIS — E1122 Type 2 diabetes mellitus with diabetic chronic kidney disease: Secondary | ICD-10-CM | POA: Diagnosis not present

## 2023-10-22 DIAGNOSIS — N184 Chronic kidney disease, stage 4 (severe): Secondary | ICD-10-CM | POA: Diagnosis not present

## 2023-10-22 DIAGNOSIS — E1169 Type 2 diabetes mellitus with other specified complication: Secondary | ICD-10-CM

## 2023-10-22 DIAGNOSIS — N2581 Secondary hyperparathyroidism of renal origin: Secondary | ICD-10-CM

## 2023-10-22 DIAGNOSIS — M1000 Idiopathic gout, unspecified site: Secondary | ICD-10-CM

## 2023-10-22 DIAGNOSIS — K219 Gastro-esophageal reflux disease without esophagitis: Secondary | ICD-10-CM

## 2023-10-22 DIAGNOSIS — E559 Vitamin D deficiency, unspecified: Secondary | ICD-10-CM

## 2023-10-22 DIAGNOSIS — Z23 Encounter for immunization: Secondary | ICD-10-CM

## 2023-10-22 DIAGNOSIS — I10 Essential (primary) hypertension: Secondary | ICD-10-CM | POA: Diagnosis not present

## 2023-10-23 LAB — LIPID PANEL
Cholesterol: 164 mg/dL (ref <200)
HDL: 42 mg/dL — ABNORMAL LOW (ref >=50)
LDL Cholesterol (Calc): 91 mg/dL
Non-HDL Cholesterol (Calc): 122 mg/dL (ref <130)
Total CHOL/HDL Ratio: 3.9 (calc) (ref <5.0)
Triglycerides: 212 mg/dL — ABNORMAL HIGH (ref <150)

## 2023-10-23 LAB — CBC WITH DIFFERENTIAL/PLATELET
Absolute Lymphocytes: 2222 {cells}/uL (ref 850–3900)
Absolute Monocytes: 623 {cells}/uL (ref 200–950)
Basophils Absolute: 49 {cells}/uL (ref 0–200)
Basophils Relative: 0.6 %
Eosinophils Absolute: 213 {cells}/uL (ref 15–500)
Eosinophils Relative: 2.6 %
HCT: 35.7 % (ref 35.0–45.0)
Hemoglobin: 11.6 g/dL — ABNORMAL LOW (ref 11.7–15.5)
MCH: 30 pg (ref 27.0–33.0)
MCHC: 32.5 g/dL (ref 32.0–36.0)
MCV: 92.2 fL (ref 80.0–100.0)
MPV: 11.1 fL (ref 7.5–12.5)
Monocytes Relative: 7.6 %
Neutro Abs: 5092 {cells}/uL (ref 1500–7800)
Neutrophils Relative %: 62.1 %
Platelets: 246 10*3/uL (ref 140–400)
RBC: 3.87 10*6/uL (ref 3.80–5.10)
RDW: 14 % (ref 11.0–15.0)
Total Lymphocyte: 27.1 %
WBC: 8.2 10*3/uL (ref 3.8–10.8)

## 2023-10-23 LAB — COMPLETE METABOLIC PANEL WITH GFR
AG Ratio: 1.7 (calc) (ref 1.0–2.5)
ALT: 11 U/L (ref 6–29)
AST: 18 U/L (ref 10–35)
Albumin: 4 g/dL (ref 3.6–5.1)
Alkaline phosphatase (APISO): 88 U/L (ref 37–153)
BUN/Creatinine Ratio: 23 (calc) — ABNORMAL HIGH (ref 6–22)
BUN: 44 mg/dL — ABNORMAL HIGH (ref 7–25)
CO2: 27 mmol/L (ref 20–32)
Calcium: 9.7 mg/dL (ref 8.6–10.4)
Chloride: 102 mmol/L (ref 98–110)
Creat: 1.93 mg/dL — ABNORMAL HIGH (ref 0.60–0.95)
Globulin: 2.4 g/dL (ref 1.9–3.7)
Glucose, Bld: 114 mg/dL — ABNORMAL HIGH (ref 65–99)
Potassium: 4.7 mmol/L (ref 3.5–5.3)
Sodium: 137 mmol/L (ref 135–146)
Total Bilirubin: 0.7 mg/dL (ref 0.2–1.2)
Total Protein: 6.4 g/dL (ref 6.1–8.1)
eGFR: 26 mL/min/{1.73_m2} — ABNORMAL LOW (ref >=60)

## 2023-10-23 LAB — MAGNESIUM: Magnesium: 2.1 mg/dL (ref 1.5–2.5)

## 2023-10-23 LAB — PARATHYROID HORMONE, INTACT (NO CA): PTH: 44 pg/mL (ref 16–77)

## 2023-10-23 LAB — VITAMIN D 25 HYDROXY (VIT D DEFICIENCY, FRACTURES): Vit D, 25-Hydroxy: 58 ng/mL (ref 30–100)

## 2023-10-23 LAB — HEMOGLOBIN A1C
Hgb A1c MFr Bld: 5.8 %{Hb} — ABNORMAL HIGH (ref <5.7)
Mean Plasma Glucose: 120 mg/dL
eAG (mmol/L): 6.6 mmol/L

## 2023-10-23 LAB — INSULIN, RANDOM: Insulin: 22.6 u[IU]/mL — ABNORMAL HIGH

## 2023-10-23 LAB — URIC ACID: Uric Acid, Serum: 3.2 mg/dL (ref 2.5–7.0)

## 2023-10-23 LAB — TSH: TSH: 2.81 m[IU]/L (ref 0.40–4.50)

## 2023-10-23 NOTE — Progress Notes (Signed)
<>*<>*<>*<>*<>*<>*<>*<>*<>*<>*<>*<>*<>*<>*<>*<>*<>*<>*<>*<>*<>*<>*<>*<>*<> <>*<>*<>*<>*<>*<>*<>*<>*<>*<>*<>*<>*<>*<>*<>*<>*<>*<>*<>*<>*<>*<>*<>*<>*<>  -  Test results slightly outside the reference range are not unusual. If there is anything important, I will review this with you,  otherwise it is considered normal test values.  If you have further questions,  please do not hesitate to contact me at the office or via My Chart.   <>*<>*<>*<>*<>*<>*<>*<>*<>*<>*<>*<>*<>*<>*<>*<>*<>*<>*<>*<>*<>*<>*<>*<>*<> <>*<>*<>*<>*<>*<>*<>*<>*<>*<>*<>*<>*<>*<>*<>*<>*<>*<>*<>*<>*<>*<>*<>*<>*<>  -  mild chronic anemia is Stable  <>*<>*<>*<>*<>*<>*<>*<>*<>*<>*<>*<>*<>*<>*<>*<>*<>*<>*<>*<>*<>*<>*<>*<>*<> <>*<>*<>*<>*<>*<>*<>*<>*<>*<>*<>*<>*<>*<>*<>*<>*<>*<>*<>*<>*<>*<>*<>*<>*<>  -  Kidney Functions  stable with Creatinine 1.93 & GFR 26 - Both Stable   <>*<>*<>*<>*<>*<>*<>*<>*<>*<>*<>*<>*<>*<>*<>*<>*<>*<>*<>*<>*<>*<>*<>*<>*<> <>*<>*<>*<>*<>*<>*<>*<>*<>*<>*<>*<>*<>*<>*<>*<>*<>*<>*<>*<>*<>*<>*<>*<>*<>  -  Chol = 164 -  Great  - Very low risk for Heart Attack  / Stroke  <>*<>*<>*<>*<>*<>*<>*<>*<>*<>*<>*<>*<>*<>*<>*<>*<>*<>*<>*<>*<>*<>*<>*<>*<> <>*<>*<>*<>*<>*<>*<>*<>*<>*<>*<>*<>*<>*<>*<>*<>*<>*<>*<>*<>*<>*<>*<>*<>*<>  - A1c 5.8% - still slightly elevated Blood Sugar, So   - Avoid Sweets, Candy & White Stuff   - White Rice, White Pacolet, White Flour  -  Breads &  Pasta  <>*<>*<>*<>*<>*<>*<>*<>*<>*<>*<>*<>*<>*<>*<>*<>*<>*<>*<>*<>*<>*<>*<>*<>*<> <>*<>*<>*<>*<>*<>*<>*<>*<>*<>*<>*<>*<>*<>*<>*<>*<>*<>*<>*<>*<>*<>*<>*<>*<>  -  Vitamin D= 58 - OK - Please keep dosage same   <>*<>*<>*<>*<>*<>*<>*<>*<>*<>*<>*<>*<>*<>*<>*<>*<>*<>*<>*<>*<>*<>*<>*<>*<> <>*<>*<>*<>*<>*<>*<>*<>*<>*<>*<>*<>*<>*<>*<>*<>*<>*<>*<>*<>*<>*<>*<>*<>*<>  -  Uric Acid  / Gout test Normal  - Please continue Allopurinol  same  <>*<>*<>*<>*<>*<>*<>*<>*<>*<>*<>*<>*<>*<>*<>*<>*<>*<>*<>*<>*<>*<>*<>*<>*<> <>*<>*<>*<>*<>*<>*<>*<>*<>*<>*<>*<>*<>*<>*<>*<>*<>*<>*<>*<>*<>*<>*<>*<>*<>  -  All Else Electrolytes - Liver - Magnesium & Thyroid    - all  Normal / OK  <>*<>*<>*<>*<>*<>*<>*<>*<>*<>*<>*<>*<>*<>*<>*<>*<>*<>*<>*<>*<>*<>*<>*<>*<> <>*<>*<>*<>*<>*<>*<>*<>*<>*<>*<>*<>*<>*<>*<>*<>*<>*<>*<>*<>*<>*<>*<>*<>*<>

## 2023-10-24 ENCOUNTER — Ambulatory Visit: Payer: Medicare Other | Admitting: Internal Medicine

## 2023-10-29 ENCOUNTER — Ambulatory Visit (HOSPITAL_BASED_OUTPATIENT_CLINIC_OR_DEPARTMENT_OTHER)
Admission: RE | Admit: 2023-10-29 | Discharge: 2023-10-29 | Disposition: A | Discharge status: Home / Self Care | Payer: Medicare Other | Source: Ambulatory Visit | Attending: Internal Medicine | Admitting: Internal Medicine

## 2023-10-29 ENCOUNTER — Other Ambulatory Visit: Payer: Self-pay | Admitting: Internal Medicine

## 2023-10-29 DIAGNOSIS — M47816 Spondylosis without myelopathy or radiculopathy, lumbar region: Secondary | ICD-10-CM | POA: Diagnosis not present

## 2023-10-29 DIAGNOSIS — M4316 Spondylolisthesis, lumbar region: Secondary | ICD-10-CM | POA: Diagnosis not present

## 2023-10-29 DIAGNOSIS — M5441 Lumbago with sciatica, right side: Secondary | ICD-10-CM | POA: Insufficient documentation

## 2023-10-29 DIAGNOSIS — G8929 Other chronic pain: Secondary | ICD-10-CM | POA: Insufficient documentation

## 2023-10-29 DIAGNOSIS — M545 Low back pain, unspecified: Secondary | ICD-10-CM | POA: Diagnosis not present

## 2023-11-13 NOTE — Progress Notes (Signed)
[][][][][][][][][][][][][][][][][][][][][][][][][][][][][][][][][][][][][][][][][]][][][][][][][][][][][][][][][][][][][][][][][[][][][][]  -   LS spine Xrays report finally returned 15 days after done on Nov 26.   - Fortunately,no serious abnormalities except arthritic changes  - Patient informed and deferred & reports Rt sciatic pains better and   She declines any further intervention at present                                             [] [] [] [] [] [] [] [] [] [] [] [] [] [] [] [] [] [] [] [] [] [] [] [] [] [] [] [] [] [] [] [] [] [] [] [] [] [] [] [] [] ][] [] [] [] [] [] [] [] [] [] [] [] [] [] [] [] [] [] [] [] [] [] [[] [] [] [] []

## 2023-12-06 ENCOUNTER — Other Ambulatory Visit: Payer: Self-pay | Admitting: Nurse Practitioner

## 2023-12-06 DIAGNOSIS — E1169 Type 2 diabetes mellitus with other specified complication: Secondary | ICD-10-CM

## 2023-12-13 ENCOUNTER — Other Ambulatory Visit: Payer: Self-pay | Admitting: Nurse Practitioner

## 2023-12-13 DIAGNOSIS — K219 Gastro-esophageal reflux disease without esophagitis: Secondary | ICD-10-CM

## 2024-01-14 ENCOUNTER — Other Ambulatory Visit: Payer: Self-pay

## 2024-01-14 DIAGNOSIS — I10 Essential (primary) hypertension: Secondary | ICD-10-CM

## 2024-01-14 MED ORDER — VALSARTAN 160 MG PO TABS
ORAL_TABLET | ORAL | 0 refills | Status: DC
Start: 2024-01-14 — End: 2024-04-03

## 2024-02-03 ENCOUNTER — Ambulatory Visit (INDEPENDENT_AMBULATORY_CARE_PROVIDER_SITE_OTHER): Payer: Medicare Other | Admitting: Family Medicine

## 2024-02-03 ENCOUNTER — Encounter: Payer: Self-pay | Admitting: Family Medicine

## 2024-02-03 VITALS — BP 181/56 | HR 53 | Ht 62.5 in | Wt 180.8 lb

## 2024-02-03 DIAGNOSIS — I10 Essential (primary) hypertension: Secondary | ICD-10-CM

## 2024-02-03 DIAGNOSIS — N184 Chronic kidney disease, stage 4 (severe): Secondary | ICD-10-CM | POA: Diagnosis not present

## 2024-02-03 NOTE — Patient Instructions (Signed)
 It was wonderful to see you today!  Today you are seen to establish care with our office.  We will get some basic labs today and then I will see you again in 3 months to follow-up and to do any other additional lab work we may need.  Please fill out a release of information forms that we can get records from your previous office.  If you have any questions or concerns in the meantime please do not hesitate to reach out.  Please call [phone] with any questions about today's appointment.   If you need any additional refills, please call your pharmacy before calling the office.  Gerrit Heck, DO Family Medicine

## 2024-02-03 NOTE — Progress Notes (Signed)
    SUBJECTIVE:   CHIEF COMPLAINT / HPI:   New patient visit, needs diabetic foot exam History of HTN, HLD, CKD IV, GERD, IBS, Gout  Meds: Allopurinol 300 mg Atenolol 50 mg Atorvastatin 20 mg Baby aspirin, Vitamin D 50 mcg daily Famotidine 20 mg Furosemide 40 mg Gabapentin 100 mg Valsartan 160 mg  HPI Has arthritis, has gotten her shigrix and other vaccines, will need to sign release of information form to obtain records from previous doctor's office.  PERTINENT  PMH / PSH: HTN, HLD, CKD IV, GERD, IBS and T2DM, Gout  OBJECTIVE:   BP (!) 181/56   Pulse (!) 53   Ht 5' 2.5" (1.588 m)   Wt 180 lb 12.8 oz (82 kg)   SpO2 99%   BMI 32.54 kg/m   General: A&O, NAD HEENT: No sign of trauma, EOM grossly intact Cardiac: RRR, no m/r/g Respiratory: CTAB, normal WOB, no w/c/r GI: Soft, NTTP, non-distended  Extremities: NTTP, no peripheral edema. Neuro: Normal gait, moves all four extremities appropriately. Psych: Appropriate mood and affect   ASSESSMENT/PLAN:   CKD (chronic kidney disease) stage 4, GFR 15-29 ml/min (HCC) Will obtain BMP and magnesium levels today, plan to follow-up in 3 months to assess other lab values.  Essential hypertension Patient with elevated blood pressure readings in the office today.  She does take her blood pressure at home and reports that it is not normally so high.  She denies any symptoms at this time including chest pressure, pain, headache or blurred vision.  Advised patient to check her blood pressure over the next couple days at home to make sure it returns to normal and if it is still greater than 180/90 would like her to call me in the morning.  Also gave her emergency department precautions should she become symptomatic.   Gerrit Heck, DO [facility]

## 2024-02-03 NOTE — Assessment & Plan Note (Signed)
 Patient with elevated blood pressure readings in the office today.  She does take her blood pressure at home and reports that it is not normally so high.  She denies any symptoms at this time including chest pressure, pain, headache or blurred vision.  Advised patient to check her blood pressure over the next couple days at home to make sure it returns to normal and if it is still greater than 180/90 would like her to call me in the morning.  Also gave her emergency department precautions should she become symptomatic.

## 2024-02-03 NOTE — Assessment & Plan Note (Signed)
 Will obtain BMP and magnesium levels today, plan to follow-up in 3 months to assess other lab values.

## 2024-02-04 ENCOUNTER — Encounter: Payer: Self-pay | Admitting: Family Medicine

## 2024-02-04 LAB — BASIC METABOLIC PANEL
BUN/Creatinine Ratio: 21 (ref 12–28)
BUN: 47 mg/dL — ABNORMAL HIGH (ref 8–27)
CO2: 23 mmol/L (ref 20–29)
Calcium: 10.2 mg/dL (ref 8.7–10.3)
Chloride: 101 mmol/L (ref 96–106)
Creatinine, Ser: 2.22 mg/dL — ABNORMAL HIGH (ref 0.57–1.00)
Glucose: 92 mg/dL (ref 70–99)
Potassium: 4.9 mmol/L (ref 3.5–5.2)
Sodium: 140 mmol/L (ref 134–144)
eGFR: 22 mL/min/{1.73_m2} — ABNORMAL LOW (ref >59)

## 2024-02-04 LAB — MAGNESIUM: Magnesium: 2.3 mg/dL (ref 1.6–2.3)

## 2024-02-05 DIAGNOSIS — N1832 Chronic kidney disease, stage 3b: Secondary | ICD-10-CM | POA: Diagnosis not present

## 2024-02-06 ENCOUNTER — Encounter: Payer: Self-pay | Admitting: *Deleted

## 2024-02-06 ENCOUNTER — Encounter: Payer: Self-pay | Admitting: Family Medicine

## 2024-02-07 ENCOUNTER — Telehealth: Payer: Self-pay | Admitting: Family Medicine

## 2024-02-07 NOTE — Telephone Encounter (Signed)
 Patient walked in stating she gets her medication from [facility].  [facility] has the doctor's address as [id number]. [facility] will not release medication until they talk to the doctor. Call [facility] at [phone]  ES wants the fax number, phone #, and address for [facility]

## 2024-02-10 NOTE — Telephone Encounter (Signed)
Routed message to PCP. Kelly Cantu, CMA  

## 2024-02-11 ENCOUNTER — Ambulatory Visit: Payer: Medicare Other | Admitting: Nurse Practitioner

## 2024-02-11 DIAGNOSIS — D631 Anemia in chronic kidney disease: Secondary | ICD-10-CM | POA: Diagnosis not present

## 2024-02-11 DIAGNOSIS — N1832 Chronic kidney disease, stage 3b: Secondary | ICD-10-CM | POA: Diagnosis not present

## 2024-02-11 DIAGNOSIS — N2581 Secondary hyperparathyroidism of renal origin: Secondary | ICD-10-CM | POA: Diagnosis not present

## 2024-02-11 DIAGNOSIS — I129 Hypertensive chronic kidney disease with stage 1 through stage 4 chronic kidney disease, or unspecified chronic kidney disease: Secondary | ICD-10-CM | POA: Diagnosis not present

## 2024-02-11 DIAGNOSIS — N189 Chronic kidney disease, unspecified: Secondary | ICD-10-CM | POA: Diagnosis not present

## 2024-02-13 ENCOUNTER — Ambulatory Visit: Payer: Medicare Other | Admitting: Nurse Practitioner

## 2024-02-20 DIAGNOSIS — N1832 Chronic kidney disease, stage 3b: Secondary | ICD-10-CM | POA: Diagnosis not present

## 2024-04-03 ENCOUNTER — Other Ambulatory Visit: Payer: Self-pay

## 2024-04-03 DIAGNOSIS — I10 Essential (primary) hypertension: Secondary | ICD-10-CM

## 2024-04-06 ENCOUNTER — Other Ambulatory Visit: Payer: Self-pay | Admitting: Family Medicine

## 2024-04-06 DIAGNOSIS — Z1231 Encounter for screening mammogram for malignant neoplasm of breast: Secondary | ICD-10-CM

## 2024-04-06 MED ORDER — VALSARTAN 160 MG PO TABS: ORAL_TABLET | ORAL | 0 refills | Status: DC

## 2024-04-13 ENCOUNTER — Encounter: Payer: Medicare Other | Admitting: Internal Medicine

## 2024-04-14 DIAGNOSIS — N1832 Chronic kidney disease, stage 3b: Secondary | ICD-10-CM | POA: Diagnosis not present

## 2024-04-22 DIAGNOSIS — N2581 Secondary hyperparathyroidism of renal origin: Secondary | ICD-10-CM | POA: Diagnosis not present

## 2024-04-22 DIAGNOSIS — N184 Chronic kidney disease, stage 4 (severe): Secondary | ICD-10-CM | POA: Diagnosis not present

## 2024-04-22 DIAGNOSIS — D631 Anemia in chronic kidney disease: Secondary | ICD-10-CM | POA: Diagnosis not present

## 2024-04-22 DIAGNOSIS — I129 Hypertensive chronic kidney disease with stage 1 through stage 4 chronic kidney disease, or unspecified chronic kidney disease: Secondary | ICD-10-CM | POA: Diagnosis not present

## 2024-05-08 ENCOUNTER — Ambulatory Visit: Admitting: Family Medicine

## 2024-05-08 NOTE — Progress Notes (Deleted)
    SUBJECTIVE:   CHIEF COMPLAINT / HPI:   Diabetes follow up Check parathyroid  levels Lipid levels  PERTINENT  PMH / PSH: ***  OBJECTIVE:   There were no vitals taken for this visit.  ***  ASSESSMENT/PLAN:   Assessment & Plan      Rayma Calandra, DO [facility]

## 2024-05-12 ENCOUNTER — Encounter: Payer: Self-pay | Admitting: Student

## 2024-05-12 ENCOUNTER — Ambulatory Visit (INDEPENDENT_AMBULATORY_CARE_PROVIDER_SITE_OTHER): Admitting: Student

## 2024-05-12 VITALS — BP 122/47 | HR 57 | Ht 63.0 in | Wt 175.0 lb

## 2024-05-12 DIAGNOSIS — N184 Chronic kidney disease, stage 4 (severe): Secondary | ICD-10-CM

## 2024-05-12 DIAGNOSIS — E7489 Other specified disorders of carbohydrate metabolism: Secondary | ICD-10-CM

## 2024-05-12 NOTE — Patient Instructions (Signed)
 It was great to see you today! Thank you for choosing [facility] for your primary care.  Today we addressed: We will order labs today Follow up in 2 months   If you haven't already, sign up for My Chart to have easy access to your labs results, and communication with your primary care physician.  Please arrive 15 minutes before your appointment to ensure smooth check in process.  We appreciate your efforts in making this happen.  Thank you for allowing me to participate in your care, Ernestina Headland, MD 05/12/2024, 3:15 PM PGY-3, [facility]

## 2024-05-12 NOTE — Progress Notes (Unsigned)
    SUBJECTIVE:   CHIEF COMPLAINT / HPI:   Follow Up:  Her blood pressure at home fluctuates between 135 and 150 mmHg, with two readings reaching 150 mmHg in the past two to three weeks. She is on valsartan  and atenolol  for blood pressure management and has taken her medication today. She does not need any medication refills. Recent blood work was conducted by her kidney specialist, though she is unsure of the specific tests ordered. She takes AREDS vitamins and multivitamins, opting not to add other vitamins listed on her paperwork. She plans to travel to the beach, [place] , and [place] . No abdominal pain is present.  PERTINENT  PMH / PSH: Reviewed   OBJECTIVE:   BP (!) 122/47   Pulse (!) 57   Ht 5\' 3"  (1.6 m)   Wt 175 lb (79.4 kg)   SpO2 99%   BMI 31.00 kg/m   General: Alert and oriented in no apparent distress Heart: Regular rate and rhythm with no murmurs appreciated Lungs: CTA bilaterally, no wheezing Abdomen: no abdominal pain Skin: Warm and dry    ASSESSMENT/PLAN:   Assessment & Plan CKD (chronic kidney disease) stage 4, GFR 15-29 ml/min (HCC) Repeat A1C, Uacr obtained today, Vit D, PTH per PCP. Sees [facility] but unable to see these records. No changes to current medications. No need for refills. Return in a couple of months.     Kelly Calandra, DO [facility] Center

## 2024-05-12 NOTE — Assessment & Plan Note (Signed)
 Repeat A1C, Uacr obtained today, Vit D, PTH per PCP. Sees [facility] but unable to see these records. No changes to current medications. No need for refills. Return in a couple of months.

## 2024-05-13 LAB — MICROALBUMIN / CREATININE URINE RATIO
Creatinine, Urine: 53 mg/dL
Microalb/Creat Ratio: 77 mg/g{creat} — ABNORMAL HIGH (ref 0–29)
Microalbumin, Urine: 41 ug/mL

## 2024-05-14 ENCOUNTER — Ambulatory Visit: Payer: Self-pay | Admitting: Student

## 2024-05-14 ENCOUNTER — Ambulatory Visit
Admission: RE | Admit: 2024-05-14 | Discharge: 2024-05-14 | Disposition: A | Discharge status: Home / Self Care | Source: Ambulatory Visit | Attending: Family Medicine | Admitting: Family Medicine

## 2024-05-14 DIAGNOSIS — Z1231 Encounter for screening mammogram for malignant neoplasm of breast: Secondary | ICD-10-CM | POA: Diagnosis not present

## 2024-05-14 LAB — LIPID PANEL
Chol/HDL Ratio: 3.5 ratio (ref 0.0–4.4)
Cholesterol, Total: 145 mg/dL (ref 100–199)
HDL: 41 mg/dL (ref >39)
LDL Chol Calc (NIH): 75 mg/dL (ref 0–99)
Triglycerides: 167 mg/dL — ABNORMAL HIGH (ref 0–149)
VLDL Cholesterol Cal: 29 mg/dL (ref 5–40)

## 2024-05-14 LAB — VITAMIN D 25 HYDROXY (VIT D DEFICIENCY, FRACTURES): Vit D, 25-Hydroxy: 55.5 ng/mL (ref 30.0–100.0)

## 2024-05-14 LAB — PTH, INTACT AND CALCIUM
Calcium: 9.4 mg/dL (ref 8.7–10.3)
PTH: 52 pg/mL (ref 15–65)

## 2024-05-19 ENCOUNTER — Encounter: Payer: Medicare Other | Admitting: Internal Medicine

## 2024-05-28 NOTE — Progress Notes (Signed)
 [facility] - Clinic Note  05/29/2024     CHIEF COMPLAINT Patient presents for Retina Follow Up   HISTORY OF PRESENT ILLNESS: Kelly Cantu is a 83 y.o. female who presents to the clinic today for:   HPI     Retina Follow Up   Patient presents with  Dry AMD.  In both eyes.  This started 3 years ago.  Duration of 9 months.  I, the attending physician,  performed the HPI with the patient and updated documentation appropriately.        Comments   Patient denies changes in vision/FOL/floaters/pain. Pt does not use ats.      Last edited by Valdemar Rogue, MD on 05/31/2024  9:49 PM.    Patient states her vision is fine, she is taking areds and checking her vision on an amsler grid   Referring physician: Glendia Simmonds, OD [address] [place],  [place] [address]  HISTORICAL INFORMATION:   Selected notes from the MEDICAL RECORD NUMBER Referred by Dr. Simmonds Glendia for concern of ARMD LEE:  Ocular Hx- PMH-    CURRENT MEDICATIONS: No current outpatient medications on file. (Ophthalmic Drugs)   No current facility-administered medications for this visit. (Ophthalmic Drugs)   Current Outpatient Medications (Other)  Medication Sig   allopurinol (ZYLOPRIM) 300 MG tablet TAKE 1 TABLET BY MOUTH EVERY DAY   atenolol  (TENORMIN ) 50 MG tablet TAKE 1 TABLET DAILY FOR BLOOD PRESSURE   atorvastatin  (LIPITOR) 20 MG tablet TAKE 1 TABLET DAILY   BABY ASPIRIN PO Take 81 mg by mouth daily.   Cholecalciferol  (VITAMIN D ) 50 MCG (2000 UT) CAPS Take 1 capsule by mouth daily.   diclofenac  sodium (VOLTAREN ) 1 % GEL Apply 4 g topically 4 (four) times daily as needed.   famotidine  (PEPCID ) 20 MG tablet TAKE 1 TABLET TWICE A DAY AS NEEDED FOR REFLUX   furosemide  (LASIX ) 40 MG tablet TAKE 1 TABLET DAILY FOR BLOOD PRESSURE AND FLUID   gabapentin  (NEURONTIN ) 100 MG capsule TAKE 1 CAPSULE THREE TIMES A DAY AS NEEDED FOR PAINFUL PERIPHERAL NEUROPATHY   Multiple Vitamins-Minerals  (ICAPS AREDS 2 PO) Take 1 capsule by mouth daily.   Omega-3 Fatty Acids (FISH OIL PO) Take by mouth daily.   valsartan  (DIOVAN ) 160 MG tablet TAKE 1 TABLET AT NIGHT FOR BLOOD PRESSURE   No current facility-administered medications for this visit. (Other)   REVIEW OF SYSTEMS: ROS   Positive for: Eyes Last edited by Elnor Avelina RAMAN, COT on 05/29/2024  2:05 PM.       ALLERGIES Allergies  Allergen Reactions   Ace Inhibitors     Pt unsure of reaction   Feldene [Piroxicam] Rash   PAST MEDICAL HISTORY Past Medical History:  Diagnosis Date   Cataract    Chronic kidney disease    DDD (degenerative disc disease), lumbar    DJD (degenerative joint disease)    GERD (gastroesophageal reflux disease)    Hyperlipidemia    Hypertension    IBS (irritable bowel syndrome)    Prediabetes    Past Surgical History:  Procedure Laterality Date   ABDOMINAL HYSTERECTOMY     CATARACT EXTRACTION Bilateral    COLONOSCOPY  x 2   w/Dr Avram   FAMILY HISTORY Family History  Problem Relation Age of Onset   Cancer Mother        liver   Liver cancer Mother    Heart disease Father    Heart attack Father  Hypertension Sister    Cancer Sister        kidney   Colon cancer Neg Hx    Breast cancer Neg Hx    SOCIAL HISTORY Social History   Tobacco Use   Smoking status: Former    Current packs/day: 0.00    Average packs/day: 0.3 packs/day for 45.5 years (15.0 ttl pk-yrs)    Types: Cigarettes    Start date: 56    Quit date: 06/16/2004    Years since quitting: 19.9   Smokeless tobacco: Never  Substance Use Topics   Alcohol use: No   Drug use: No       OPHTHALMIC EXAM:  Base Eye Exam     Visual Acuity (Snellen - Linear)       Right Left   Dist [place] 20/40 -2 20/50   Dist ph  20/30 -1 20/30 -2         Tonometry (Tonopen, 2:14 PM)       Right Left   Pressure 18 19         Pupils       Pupils Dark Light Shape React APD   Right PERRL 3 2 Round Brisk None   Left  PERRL 3 2 Round Brisk None         Visual Fields       Left Right    Full Full         Extraocular Movement       Right Left    Full, Ortho Full, Ortho         Neuro/Psych     Oriented x3: Yes   Mood/Affect: Normal         Dilation     Both eyes: 1.0% Mydriacyl, 2.5% Phenylephrine @ 2:14 PM           Slit Lamp and Fundus Exam     Slit Lamp Exam       Right Left   Lids/Lashes Dermatochalasis - upper lid Dermatochalasis - upper lid   Conjunctiva/Sclera White and quiet White and quiet   Cornea 1+ Punctate epithelial erosions, trace tear film debris, well healed cataract wound 1+ Punctate epithelial erosions, trace tear film debris, well healed cataract wound   Anterior Chamber deep and clear deep and clear   Iris Round and dilated Round and dilated   Lens PC IOL in good position with open PC PC IOL in good position with open PC   Anterior Vitreous mild syneresis, Posterior vitreous detachment mild syneresis, Posterior vitreous detachment         Fundus Exam       Right Left   Disc mild Pallor, Sharp rim, mild PPA 1-2 Pallor, Sharp rim   C/D Ratio 0.4 0.5   Macula Flat, Blunted foveal reflex, Drusen, RPE mottling and clumping, No heme or edema, central vitelliform like lesion -- stable Flat, Blunted foveal reflex, fine Drusen, RPE mottling and clumping, central vitelliform like lesion, No heme or edema   Vessels attenuated, mild tortuosity attenuated, Tortuous   Periphery Attached, mild reticular degeneration, No heme Attached, mild reticular degeneration, No heme           IMAGING AND PROCEDURES  Imaging and Procedures for 05/29/2024  OCT, Retina - OU - Both Eyes       Right Eye Quality was good. Central Foveal Thickness: 300. Progression has been stable. Findings include normal foveal contour, no IRF, no SRF, retinal drusen , subretinal hyper-reflective material (Central vitelliform like lesion--  stable from prior, no fluid).   Left  Eye Quality was good. Central Foveal Thickness: 336. Progression has been stable. Findings include no IRF, no SRF, abnormal foveal contour, retinal drusen , subretinal hyper-reflective material, pigment epithelial detachment (Central vitelliform like lesion).   Notes *Images captured and stored on drive  Diagnosis / Impression:  Non-exu ARMD OU Central vitelliform lesion OU -- stable, no fluid  Clinical management:  See below  Abbreviations: NFP - Normal foveal profile. CME - cystoid macular edema. PED - pigment epithelial detachment. IRF - intraretinal fluid. SRF - subretinal fluid. EZ - ellipsoid zone. ERM - epiretinal membrane. ORA - outer retinal atrophy. ORT - outer retinal tubulation. SRHM - subretinal hyper-reflective material. IRHM - intraretinal hyper-reflective material            ASSESSMENT/PLAN:    ICD-10-CM   1. Intermediate stage nonexudative age-related macular degeneration of both eyes  H35.3132 OCT, Retina - OU - Both Eyes    2. Essential hypertension  I10     3. Hypertensive retinopathy of both eyes  H35.033     4. Pseudophakia, both eyes  Z96.1      Age related macular degeneration, non-exudative, both eyes  - intermediate stage with central vitelliform like lesions OU -- stable  - cont amsler grid monitoring  - f/u 9-12 months, DFE, OCT  2,3. Hypertensive retinopathy OU - discussed importance of tight BP control - monitor  4. Pseudophakia OU  - s/p CE/IOL OU (Dr. Lavonia)  - IOLs in good position, doing well  - monitor  Ophthalmic Meds Ordered this visit:  No orders of the defined types were placed in this encounter.    Return for f/u 9-12 months, non-exu ARMD OU, DFE, OCT.  There are no Patient Instructions on file for this visit.  This document serves as a record of services personally performed by Redell JUDITHANN Hans, MD, PhD. It was created on their behalf by Delon Newness COT, an ophthalmic technician. The creation of this record is the  provider's dictation and/or activities during the visit.    Electronically signed by: Delon Newness COT 06.27.25 9:50 PM  This document serves as a record of services personally performed by Redell JUDITHANN Hans, MD, PhD. It was created on their behalf by Alan PARAS. Delores, OA an ophthalmic technician. The creation of this record is the provider's dictation and/or activities during the visit.    Electronically signed by: Alan PARAS. Delores, OA 05/31/24 9:50 PM  Redell JUDITHANN Hans, M.D., Ph.D. Diseases & Surgery of the Retina and Vitreous [facility] 05/29/2024   I have reviewed the above documentation for accuracy and completeness, and I agree with the above. Redell JUDITHANN Hans, M.D., Ph.D. 05/31/24 9:51 PM   Abbreviations: M myopia (nearsighted); A astigmatism; H hyperopia (farsighted); P presbyopia; Mrx spectacle prescription;  CTL contact lenses; OD right eye; OS left eye; OU both eyes  XT exotropia; ET esotropia; PEK punctate epithelial keratitis; PEE punctate epithelial erosions; DES dry eye syndrome; MGD meibomian gland dysfunction; ATs artificial tears; PFAT's preservative free artificial tears; NSC nuclear sclerotic cataract; PSC posterior subcapsular cataract; ERM epi-retinal membrane; PVD posterior vitreous detachment; RD retinal detachment; DM diabetes mellitus; DR diabetic retinopathy; NPDR non-proliferative diabetic retinopathy; PDR proliferative diabetic retinopathy; CSME clinically significant macular edema; DME diabetic macular edema; dbh dot blot hemorrhages; CWS cotton wool spot; POAG primary open angle glaucoma; C/D cup-to-disc ratio; HVF humphrey visual field; GVF goldmann visual field; OCT optical coherence tomography; IOP intraocular  pressure; BRVO Branch retinal vein occlusion; CRVO central retinal vein occlusion; CRAO central retinal artery occlusion; BRAO branch retinal artery occlusion; RT retinal tear; SB scleral buckle; PPV pars plana vitrectomy; VH Vitreous  hemorrhage; PRP panretinal laser photocoagulation; IVK intravitreal kenalog; VMT vitreomacular traction; MH Macular hole;  NVD neovascularization of the disc; NVE neovascularization elsewhere; AREDS age related eye disease study; ARMD age related macular degeneration; POAG primary open angle glaucoma; EBMD epithelial/anterior basement membrane dystrophy; ACIOL anterior chamber intraocular lens; IOL intraocular lens; PCIOL posterior chamber intraocular lens; Phaco/IOL phacoemulsification with intraocular lens placement; PRK photorefractive keratectomy; LASIK laser assisted in situ keratomileusis; HTN hypertension; DM diabetes mellitus; COPD chronic obstructive pulmonary disease

## 2024-05-29 ENCOUNTER — Encounter (INDEPENDENT_AMBULATORY_CARE_PROVIDER_SITE_OTHER): Payer: Self-pay | Admitting: Ophthalmology

## 2024-05-29 ENCOUNTER — Ambulatory Visit (INDEPENDENT_AMBULATORY_CARE_PROVIDER_SITE_OTHER): Payer: Medicare Other | Admitting: Ophthalmology

## 2024-05-29 DIAGNOSIS — Z961 Presence of intraocular lens: Secondary | ICD-10-CM

## 2024-05-29 DIAGNOSIS — I10 Essential (primary) hypertension: Secondary | ICD-10-CM

## 2024-05-29 DIAGNOSIS — H353132 Nonexudative age-related macular degeneration, bilateral, intermediate dry stage: Secondary | ICD-10-CM | POA: Diagnosis not present

## 2024-05-29 DIAGNOSIS — H35033 Hypertensive retinopathy, bilateral: Secondary | ICD-10-CM

## 2024-05-31 ENCOUNTER — Encounter (INDEPENDENT_AMBULATORY_CARE_PROVIDER_SITE_OTHER): Payer: Self-pay | Admitting: Ophthalmology

## 2024-06-29 ENCOUNTER — Other Ambulatory Visit: Payer: Self-pay

## 2024-06-29 MED ORDER — ATENOLOL 50 MG PO TABS: 50.0000 mg | ORAL_TABLET | Freq: Every day | ORAL | 3 refills | Status: AC

## 2024-07-06 ENCOUNTER — Other Ambulatory Visit: Payer: Self-pay | Admitting: Family Medicine

## 2024-07-06 DIAGNOSIS — I10 Essential (primary) hypertension: Secondary | ICD-10-CM

## 2024-07-07 ENCOUNTER — Encounter: Payer: Self-pay | Admitting: Family Medicine

## 2024-07-07 ENCOUNTER — Ambulatory Visit (INDEPENDENT_AMBULATORY_CARE_PROVIDER_SITE_OTHER): Admitting: Family Medicine

## 2024-07-07 VITALS — BP 156/63 | HR 59 | Ht 63.0 in | Wt 175.2 lb

## 2024-07-07 DIAGNOSIS — N184 Chronic kidney disease, stage 4 (severe): Secondary | ICD-10-CM | POA: Diagnosis not present

## 2024-07-07 DIAGNOSIS — G8929 Other chronic pain: Secondary | ICD-10-CM | POA: Diagnosis not present

## 2024-07-07 DIAGNOSIS — M25561 Pain in right knee: Secondary | ICD-10-CM | POA: Diagnosis not present

## 2024-07-07 DIAGNOSIS — M25562 Pain in left knee: Secondary | ICD-10-CM | POA: Diagnosis not present

## 2024-07-07 DIAGNOSIS — I10 Essential (primary) hypertension: Secondary | ICD-10-CM

## 2024-07-07 MED ORDER — VALSARTAN 160 MG PO TABS: ORAL_TABLET | ORAL | 3 refills | Status: AC

## 2024-07-07 MED ORDER — EMPAGLIFLOZIN 10 MG PO TABS: 10.0000 mg | ORAL_TABLET | Freq: Every day | ORAL | 3 refills | Status: DC

## 2024-07-07 MED ORDER — FUROSEMIDE 40 MG PO TABS: 40.0000 mg | ORAL_TABLET | Freq: Every day | ORAL | 3 refills | Status: AC

## 2024-07-07 NOTE — Assessment & Plan Note (Signed)
-   Refilled Diovan  and Lasix

## 2024-07-07 NOTE — Progress Notes (Signed)
    SUBJECTIVE:   CHIEF COMPLAINT / HPI:   Follow up from previous visit. UACr elevated, will discuss Kelly Cantu kidney notes as able. Should consider starting SGLT2 if patient is agreeable today.  Patient also reporting concern for knee swelling.  No redness heat or tenderness.  Some occasional pain if she puts weight on the knee.  More concerned that the swelling may represent something worse than just arthritis.  L knee w/OA, has had injection in the past >6 months ago with relief.  Inquiring about whether she needs to go back to Ortho to have these injections or if she can have them done here in the office.  PERTINENT  PMH / PSH: CKD stage IV, osteoarthritis  OBJECTIVE:   BP (!) 156/63   Pulse (!) 59   Ht 5' 3 (1.6 m)   Wt 175 lb 4 oz (79.5 kg)   SpO2 99%   BMI 31.04 kg/m   General: A&O, NAD HEENT: No sign of trauma, EOM grossly intact Cardiac: RRR, no m/r/g Respiratory: CTAB, normal WOB, no w/c/r GI: Soft, NTTP, non-distended  Extremities: NTTP, no peripheral edema. POCUS exam of right knee: Nondiagnostic exam, for education and observation purposes only.  Small fluid collection on medial and lateral sides of suprapatellar ligament.  No joint line effusion noted.  No loculations or cystic appearance of fluid collections.   ASSESSMENT/PLAN:   Assessment & Plan CKD (chronic kidney disease) stage 4, GFR 15-29 ml/min (HCC) - Stable, repeat BMP today -Begin SGLT2 for renal protection Essential hypertension - Refilled Diovan  and Lasix  Chronic pain of both knees - POCUS exam performed in office today, nondiagnostic -Trace effusion of suprapatellar pouch, no concern for gouty arthritis or infection clinically today -Scheduled patient for an office corticosteroid injection of the left knee at her request.   Kelly Pinal, DO [facility]

## 2024-07-07 NOTE — Patient Instructions (Addendum)
 It was wonderful to see you today!  Today we repeated your electrolytes and kidney function to continue to monitor your kidney disease.  We also started a medicine called Jardiance  which will help reduce the sugar in your blood as well as protect your kidneys moving forward.  Take this medicine every day, I recommend taking it in the morning as it can increase your frequency of urination.  We also took a look at the swelling in your knee with an ultrasound today in the office.  This ultrasound is purely for observation and cannot be used to diagnose any conditions, however we did not see any concerning features which would prompt further investigation.  I have scheduled you with my colleague Dr. Ozell Provencal on August 11 at 1:50 PM for your left knee osteoarthritis.  Please arrive 15 minutes ahead of time as you would for any usual office visit.  Please call [phone] with any questions about today's appointment.   If you need any additional refills, please call your pharmacy before calling the office.  Lucie Pinal, DO Family Medicine

## 2024-07-07 NOTE — Assessment & Plan Note (Signed)
-   Stable, repeat BMP today -Begin SGLT2 for renal protection

## 2024-07-08 ENCOUNTER — Ambulatory Visit: Payer: Self-pay | Admitting: Family Medicine

## 2024-07-08 LAB — BASIC METABOLIC PANEL WITH GFR
BUN/Creatinine Ratio: 24 (ref 12–28)
BUN: 59 mg/dL — ABNORMAL HIGH (ref 8–27)
CO2: 19 mmol/L — ABNORMAL LOW (ref 20–29)
Calcium: 9.7 mg/dL (ref 8.7–10.3)
Chloride: 93 mmol/L — ABNORMAL LOW (ref 96–106)
Creatinine, Ser: 2.44 mg/dL — ABNORMAL HIGH (ref 0.57–1.00)
Glucose: 97 mg/dL (ref 70–99)
Potassium: 4.8 mmol/L (ref 3.5–5.2)
Sodium: 131 mmol/L — ABNORMAL LOW (ref 134–144)
eGFR: 19 mL/min/1.73 — ABNORMAL LOW (ref >59)

## 2024-07-13 ENCOUNTER — Ambulatory Visit (INDEPENDENT_AMBULATORY_CARE_PROVIDER_SITE_OTHER): Payer: Self-pay | Admitting: Family Medicine

## 2024-07-13 VITALS — BP 147/63 | HR 68 | Wt 180.6 lb

## 2024-07-13 DIAGNOSIS — M1712 Unilateral primary osteoarthritis, left knee: Secondary | ICD-10-CM | POA: Insufficient documentation

## 2024-07-13 MED ORDER — METHYLPREDNISOLONE ACETATE 40 MG/ML IJ SUSP
40.0000 mg | Freq: Once | INTRAMUSCULAR | Status: AC
  Administered 2024-07-13 (×2): 40 mg via INTRAMUSCULAR

## 2024-07-13 NOTE — Patient Instructions (Signed)
 Today you received an injection with corticosteroid. This injection is usually done in response to pain and inflammation. There is some "numbing" medicine also in the shot so the injected area may be numb and feel really good for the next couple of hours. The numbing medicine usually wears off in 2-3 hours though, and then your pain level will be right back where it was before the injection.   The actually benefit from the steroid injection is usually noticed in 2-7 days. You may actually experience a small (as in 10%) INCREASE in pain in the first 24 hours---that is common.   Things to watch out for that you should contact us  or a health care provider urgently would include: 1. Unusual (as in more than 10%) increase in pain 2. New fever > 101.5 3. New swelling or redness of the injected area.  4. Streaking of red lines around the area injected.

## 2024-07-13 NOTE — Progress Notes (Signed)
    SUBJECTIVE:   CHIEF COMPLAINT / HPI: knee injection  Seen by Dr. Cleotilde on 07/07/24 with knee pain. Chronic OA. Scheduled for corticosteroid injection today.  Has had chronic pain in left knee and prior steroid injections that have provided significant relief.  PERTINENT  PMH / PSH: HTN, GERD, IBS, T2DM, HLD, Diabetic Neuropathy, CKD4  OBJECTIVE:   BP (!) 147/63   Pulse 68   Wt 180 lb 9.6 oz (81.9 kg)   SpO2 98%   BMI 31.99 kg/m   General: NAD, well appearing Neuro: A&O Respiratory: normal WOB on RA Extremities: Moving all 4 extremities equally Left knee: No obvious erythema, there is some mild swelling, no deformities, mildly tender to palpation along the medial joint line  ASSESSMENT/PLAN:   Assessment & Plan Primary osteoarthritis of left knee Left knee corticosteroid injection as below.  Patient provided with return precautions.  Next eligible for the injection in 3 months.  PROCEDURE: INJECTION Location: Left knee Diagnosis: Primary osteoarthritis Patient was given informed consent, signed copy in the chart. Appropriate time out was taken. Location was prepped in usual sterile fashion. Ethyl chloride was used for local anesthesia. A 21 gauge 1 1/2 inch needle was used. 1 cc of methylprednisolone  40 mg/ml plus  4 cc of 1% lidocaine  without epinephrine was injected into the left knee using a(n) medial  approach.   The patient tolerated the procedure well. There were no complications. Post procedure instructions were given.   Return if symptoms worsen or fail to improve.  Kelly Provencal, MD [facility]

## 2024-07-13 NOTE — Assessment & Plan Note (Signed)
 Left knee corticosteroid injection as below.  Patient provided with return precautions.  Next eligible for the injection in 3 months.

## 2024-07-16 DIAGNOSIS — H35363 Drusen (degenerative) of macula, bilateral: Secondary | ICD-10-CM | POA: Diagnosis not present

## 2024-07-16 DIAGNOSIS — H5213 Myopia, bilateral: Secondary | ICD-10-CM | POA: Diagnosis not present

## 2024-07-23 ENCOUNTER — Ambulatory Visit: Payer: Medicare Other | Admitting: Nurse Practitioner

## 2024-08-05 ENCOUNTER — Telehealth: Payer: Self-pay

## 2024-08-05 DIAGNOSIS — N184 Chronic kidney disease, stage 4 (severe): Secondary | ICD-10-CM

## 2024-08-05 MED ORDER — EMPAGLIFLOZIN 10 MG PO TABS: 10.0000 mg | ORAL_TABLET | Freq: Every day | ORAL | 3 refills | Status: AC

## 2024-08-05 NOTE — Telephone Encounter (Signed)
 Patient calls nurse line in regards to Jardiance  prescription.   She reports [facility] would only fill #30 of the #90 written due to her insurance.   She is requesting we send #90 to [facility] going forward.   I called [facility] and cancelled the remaining the prescription.   Will forward to PCP to resend to [facility].

## 2024-08-10 DIAGNOSIS — N184 Chronic kidney disease, stage 4 (severe): Secondary | ICD-10-CM | POA: Diagnosis not present

## 2024-08-17 DIAGNOSIS — D631 Anemia in chronic kidney disease: Secondary | ICD-10-CM | POA: Diagnosis not present

## 2024-08-17 DIAGNOSIS — I129 Hypertensive chronic kidney disease with stage 1 through stage 4 chronic kidney disease, or unspecified chronic kidney disease: Secondary | ICD-10-CM | POA: Diagnosis not present

## 2024-08-17 DIAGNOSIS — N184 Chronic kidney disease, stage 4 (severe): Secondary | ICD-10-CM | POA: Diagnosis not present

## 2024-08-17 DIAGNOSIS — N2581 Secondary hyperparathyroidism of renal origin: Secondary | ICD-10-CM | POA: Diagnosis not present

## 2024-08-20 ENCOUNTER — Ambulatory Visit: Payer: Medicare Other | Admitting: Nurse Practitioner

## 2024-11-06 ENCOUNTER — Ambulatory Visit: Admitting: Family Medicine

## 2024-11-16 ENCOUNTER — Ambulatory Visit (INDEPENDENT_AMBULATORY_CARE_PROVIDER_SITE_OTHER): Admitting: Family Medicine

## 2024-11-16 ENCOUNTER — Encounter: Payer: Self-pay | Admitting: Family Medicine

## 2024-11-16 VITALS — BP 143/62 | HR 62 | Wt 175.8 lb

## 2024-11-16 DIAGNOSIS — M1712 Unilateral primary osteoarthritis, left knee: Secondary | ICD-10-CM

## 2024-11-16 DIAGNOSIS — Z0000 Encounter for general adult medical examination without abnormal findings: Secondary | ICD-10-CM

## 2024-11-16 DIAGNOSIS — Z23 Encounter for immunization: Secondary | ICD-10-CM

## 2024-11-16 DIAGNOSIS — E1122 Type 2 diabetes mellitus with diabetic chronic kidney disease: Secondary | ICD-10-CM

## 2024-11-16 DIAGNOSIS — N184 Chronic kidney disease, stage 4 (severe): Secondary | ICD-10-CM

## 2024-11-16 NOTE — Progress Notes (Signed)
° ° °  SUBJECTIVE:   Chief compliant/HPI: annual examination  Kelly Cantu is a 83 y.o. who presents today for an annual exam.   History tabs reviewed and updated, due for foot exam and A1c.   Review of systems form reviewed and notable for L knee pain.   This is consistent with her prior primary knee OA noted back on 8/11.  At that time she had a cortisone injection which provided about 3 weeks of relief.  She is requesting repeat injection today.  OBJECTIVE:   BP (!) 143/62   Pulse 62   Wt 175 lb 12.8 oz (79.7 kg)   SpO2 97%   BMI 31.14 kg/m   General: A&O, NAD HEENT: No sign of trauma, EOM grossly intact Cardiac: RRR, no m/r/g Respiratory: CTAB, normal WOB, no w/c/r GI: Soft, NTTP, non-distended  Extremities: NTTP, no peripheral edema. MSK, Knee:  Palpation- normal knee contour, no joint line tenderness, patella appropriately located  within the patellofemoral groove. Baker's cyst not present. No apparent edema  Skin- without erythema or ecchymosis  ROM- full, painful on extendsion  Strength- 5/5 bilaterally  Tests- positive Patellofemoral grinding on the L  Knee Injection  After informed written consent was obtained, patient was seated on exam table. L knee was prepped with alcohol swab. Utilizing Lateral approach, patient's left knee was injected intraarticularly with mixture of 1cc of depomedrol 40mg /mL and 4cc of 1% lidocaine  without epinephrine. Patient tolerated the procedure well without immediate complications.          ASSESSMENT/PLAN:   Assessment & Plan Type 2 diabetes mellitus with stage 4 chronic kidney disease, without long-term current use of insulin  (HCC) -diabetic foot exam completed today -A1c scheduled for Monday due to lab issues Primary osteoarthritis of left knee -steroid injection completed, patient tolerated well -counseled on after effects - patient to follow up if it wears off in a short time again, considering referral to sports  med for alternative therapies Encounter for annual wellness visit (AWV) in Medicare patient See below  Annual Examination  See AVS for age appropriate recommendations  BP reviewed and at goal patient has wide pulse pressure so further optimization is not possible.  Asked about intimate partner violence and resources given as appropriate   Considered the following items based upon USPSTF recommendations: Diabetes screening: deferred due to lab difficulties Lipid panel (nonfasting or fasting) discussed based upon AHA recommendations and recently completed and repeat not yet indicated.  Reviewed risk factors for latent tuberculosis and not indicated Osteoporosis screening completed previously  Cancer Screening Discussion  Breast cancer screening: recently completed and repeat not yet indicated Lung cancer screening:not indicated as does not meet criteria.  See documentation below regarding indications/risks/benefits.  Colorectal cancer screening: not applicable given age. .  Vaccinations pneumonia and flu given today.   Follow up in 1 year or sooner if indicated.  MyChart Activation: Signed up today  Lucie Pinal, DO [facility]

## 2024-11-16 NOTE — Patient Instructions (Signed)
 It was wonderful to see you today!  Today was your annual wellness visit.  You got your pneumonia and your flu vaccines and we did your annual foot exam.  Unfortunately we are not able to do a hemoglobin A1c today so you will have to come back on Monday and we will check that as well.  You also received a steroid injection in your left knee today.  You will likely have good pain control for the rest of the day today due to the lidocaine  and the injection.  That will wear off tomorrow and you may have some increased soreness from the injection.  The steroid will take effect over the next few days and reach maximum effectiveness in about 1 week.  If you do not receive more than 1 month of relief from this, please call our office and we can get you scheduled with sports medicine for other options.  If you experience redness, swelling and pain in your knee, please call us  right away as these can be signs of infection.  While this is a rare complication due to the way that we clean and injected these joints it is a realistic and if you have any signs and symptoms it is important you let me know right away.  Please call [phone] with any questions about today's appointment.   If you need any additional refills, please call your pharmacy before calling the office.  Lucie Pinal, DO Family Medicine

## 2024-11-16 NOTE — Assessment & Plan Note (Signed)
-  steroid injection completed, patient tolerated well -counseled on after effects - patient to follow up if it wears off in a short time again, considering referral to sports med for alternative therapies

## 2024-11-16 NOTE — Assessment & Plan Note (Signed)
-  diabetic foot exam completed today -A1c scheduled for Monday due to lab issues

## 2024-11-23 ENCOUNTER — Other Ambulatory Visit: Payer: Self-pay

## 2024-11-23 DIAGNOSIS — N184 Chronic kidney disease, stage 4 (severe): Secondary | ICD-10-CM

## 2024-11-23 DIAGNOSIS — E1122 Type 2 diabetes mellitus with diabetic chronic kidney disease: Secondary | ICD-10-CM | POA: Diagnosis present

## 2024-11-23 LAB — POCT GLYCOSYLATED HEMOGLOBIN (HGB A1C): HbA1c, POC (controlled diabetic range): 5.7 % (ref 0.0–7.0)

## 2024-11-30 ENCOUNTER — Other Ambulatory Visit: Payer: Self-pay | Admitting: Nurse Practitioner

## 2024-11-30 DIAGNOSIS — E1169 Type 2 diabetes mellitus with other specified complication: Secondary | ICD-10-CM

## 2024-12-07 ENCOUNTER — Other Ambulatory Visit: Payer: Self-pay | Admitting: Nurse Practitioner

## 2024-12-07 DIAGNOSIS — K219 Gastro-esophageal reflux disease without esophagitis: Secondary | ICD-10-CM

## 2024-12-23 ENCOUNTER — Other Ambulatory Visit: Payer: Self-pay

## 2024-12-23 DIAGNOSIS — E1140 Type 2 diabetes mellitus with diabetic neuropathy, unspecified: Secondary | ICD-10-CM

## 2024-12-23 DIAGNOSIS — K219 Gastro-esophageal reflux disease without esophagitis: Secondary | ICD-10-CM

## 2024-12-24 MED ORDER — FAMOTIDINE 20 MG PO TABS: ORAL_TABLET | ORAL | 3 refills | Status: AC

## 2024-12-24 MED ORDER — GABAPENTIN 100 MG PO CAPS: ORAL_CAPSULE | ORAL | 3 refills | Status: AC

## 2024-12-29 ENCOUNTER — Other Ambulatory Visit: Payer: Self-pay

## 2024-12-29 DIAGNOSIS — E1169 Type 2 diabetes mellitus with other specified complication: Secondary | ICD-10-CM

## 2024-12-29 MED ORDER — ATORVASTATIN CALCIUM 20 MG PO TABS: 20.0000 mg | ORAL_TABLET | Freq: Every day | ORAL | 3 refills | Status: AC

## 2025-02-26 ENCOUNTER — Encounter (INDEPENDENT_AMBULATORY_CARE_PROVIDER_SITE_OTHER): Admitting: Ophthalmology
